# Patient Record
Sex: Female | Born: 1945
Health system: Southern US, Community
[De-identification: ages and names within clinical notes are randomized; demographics above are authoritative.]

## PROBLEM LIST (undated history)

## (undated) DIAGNOSIS — I350 Nonrheumatic aortic (valve) stenosis: Secondary | ICD-10-CM

## (undated) DIAGNOSIS — K219 Gastro-esophageal reflux disease without esophagitis: Secondary | ICD-10-CM

## (undated) DIAGNOSIS — G709 Myoneural disorder, unspecified: Secondary | ICD-10-CM

## (undated) DIAGNOSIS — G629 Polyneuropathy, unspecified: Secondary | ICD-10-CM

## (undated) DIAGNOSIS — Z973 Presence of spectacles and contact lenses: Secondary | ICD-10-CM

## (undated) DIAGNOSIS — M1990 Unspecified osteoarthritis, unspecified site: Secondary | ICD-10-CM

## (undated) DIAGNOSIS — H9190 Unspecified hearing loss, unspecified ear: Secondary | ICD-10-CM

## (undated) DIAGNOSIS — R031 Nonspecific low blood-pressure reading: Secondary | ICD-10-CM

## (undated) DIAGNOSIS — R55 Syncope and collapse: Secondary | ICD-10-CM

## (undated) DIAGNOSIS — F0390 Unspecified dementia without behavioral disturbance: Secondary | ICD-10-CM

## (undated) DIAGNOSIS — Z972 Presence of dental prosthetic device (complete) (partial): Secondary | ICD-10-CM

## (undated) DIAGNOSIS — I10 Essential (primary) hypertension: Secondary | ICD-10-CM

## (undated) DIAGNOSIS — K08109 Complete loss of teeth, unspecified cause, unspecified class: Secondary | ICD-10-CM

## (undated) DIAGNOSIS — E039 Hypothyroidism, unspecified: Secondary | ICD-10-CM

## (undated) DIAGNOSIS — E785 Hyperlipidemia, unspecified: Secondary | ICD-10-CM

## (undated) DIAGNOSIS — H409 Unspecified glaucoma: Secondary | ICD-10-CM

## (undated) DIAGNOSIS — D649 Anemia, unspecified: Secondary | ICD-10-CM

## (undated) DIAGNOSIS — T7840XA Allergy, unspecified, initial encounter: Secondary | ICD-10-CM

## (undated) DIAGNOSIS — F32A Depression, unspecified: Secondary | ICD-10-CM

## (undated) HISTORY — DX: Syncope and collapse: R55

## (undated) HISTORY — DX: Nonrheumatic aortic (valve) stenosis: I35.0

## (undated) HISTORY — DX: Anemia, unspecified: D64.9

## (undated) HISTORY — PX: COLONOSCOPY: SHX174

## (undated) HISTORY — DX: Allergy, unspecified, initial encounter: T78.40XA

## (undated) HISTORY — DX: Unspecified osteoarthritis, unspecified site: M19.90

## (undated) HISTORY — DX: Gastro-esophageal reflux disease without esophagitis: K21.9

## (undated) HISTORY — PX: TUBAL LIGATION: SHX77

## (undated) HISTORY — DX: Nonspecific low blood-pressure reading: R03.1

## (undated) HISTORY — DX: Hyperlipidemia, unspecified: E78.5

## (undated) HISTORY — DX: Essential (primary) hypertension: I10

## (undated) HISTORY — PX: ABDOMINAL HYSTERECTOMY: SHX81

## (undated) HISTORY — PX: APPENDECTOMY: SHX54

## (undated) HISTORY — PX: PITUITARY SURGERY: SHX203

---

## 1997-12-18 DIAGNOSIS — I639 Cerebral infarction, unspecified: Secondary | ICD-10-CM

## 1997-12-18 DIAGNOSIS — K759 Inflammatory liver disease, unspecified: Secondary | ICD-10-CM

## 1997-12-18 DIAGNOSIS — Z8673 Personal history of transient ischemic attack (TIA), and cerebral infarction without residual deficits: Secondary | ICD-10-CM

## 1997-12-18 HISTORY — DX: Inflammatory liver disease, unspecified: K75.9

## 1997-12-18 HISTORY — DX: Cerebral infarction, unspecified: I63.9

## 1997-12-18 HISTORY — DX: Personal history of transient ischemic attack (TIA), and cerebral infarction without residual deficits: Z86.73

## 2005-09-28 ENCOUNTER — Ambulatory Visit: Payer: Self-pay | Admitting: Internal Medicine

## 2005-10-09 ENCOUNTER — Ambulatory Visit: Payer: Self-pay | Admitting: Internal Medicine

## 2005-10-19 ENCOUNTER — Ambulatory Visit: Payer: Self-pay | Admitting: Internal Medicine

## 2005-10-21 ENCOUNTER — Ambulatory Visit (HOSPITAL_COMMUNITY): Admission: RE | Admit: 2005-10-21 | Discharge: 2005-10-21 | Payer: Self-pay | Admitting: Internal Medicine

## 2005-10-30 ENCOUNTER — Ambulatory Visit: Payer: Self-pay | Admitting: Gastroenterology

## 2005-11-22 ENCOUNTER — Ambulatory Visit: Payer: Self-pay | Admitting: Internal Medicine

## 2005-11-23 ENCOUNTER — Encounter (INDEPENDENT_AMBULATORY_CARE_PROVIDER_SITE_OTHER): Payer: Self-pay | Admitting: Internal Medicine

## 2005-11-23 LAB — CONVERTED CEMR LAB: TSH: 3.061 microintl units/mL

## 2005-12-06 ENCOUNTER — Ambulatory Visit: Payer: Self-pay | Admitting: Gastroenterology

## 2005-12-21 ENCOUNTER — Ambulatory Visit (HOSPITAL_COMMUNITY): Admission: RE | Admit: 2005-12-21 | Discharge: 2005-12-21 | Payer: Self-pay | Admitting: Gastroenterology

## 2005-12-21 ENCOUNTER — Ambulatory Visit: Payer: Self-pay | Admitting: Gastroenterology

## 2006-01-01 ENCOUNTER — Ambulatory Visit: Payer: Self-pay | Admitting: Internal Medicine

## 2006-02-05 ENCOUNTER — Ambulatory Visit: Payer: Self-pay | Admitting: Internal Medicine

## 2006-02-22 ENCOUNTER — Ambulatory Visit: Payer: Self-pay | Admitting: Internal Medicine

## 2006-03-03 ENCOUNTER — Ambulatory Visit (HOSPITAL_COMMUNITY): Admission: RE | Admit: 2006-03-03 | Discharge: 2006-03-03 | Payer: Self-pay | Admitting: Internal Medicine

## 2006-03-05 ENCOUNTER — Ambulatory Visit: Payer: Self-pay | Admitting: Internal Medicine

## 2006-05-07 ENCOUNTER — Ambulatory Visit: Payer: Self-pay | Admitting: Internal Medicine

## 2006-06-04 ENCOUNTER — Ambulatory Visit: Payer: Self-pay | Admitting: Internal Medicine

## 2006-06-05 ENCOUNTER — Encounter (INDEPENDENT_AMBULATORY_CARE_PROVIDER_SITE_OTHER): Payer: Self-pay | Admitting: Internal Medicine

## 2006-06-05 LAB — CONVERTED CEMR LAB
Microalbumin U total vol: 0.49 mg/L
Microalbumin U total vol: 0.49 mg/L

## 2006-07-03 ENCOUNTER — Ambulatory Visit: Payer: Self-pay | Admitting: Internal Medicine

## 2006-07-12 ENCOUNTER — Ambulatory Visit: Payer: Self-pay | Admitting: Internal Medicine

## 2006-08-06 ENCOUNTER — Ambulatory Visit: Payer: Self-pay | Admitting: Internal Medicine

## 2006-08-07 ENCOUNTER — Encounter: Admission: RE | Admit: 2006-08-07 | Discharge: 2006-08-07 | Payer: Self-pay | Admitting: Internal Medicine

## 2006-10-08 ENCOUNTER — Ambulatory Visit: Payer: Self-pay | Admitting: Internal Medicine

## 2006-10-16 ENCOUNTER — Encounter: Payer: Self-pay | Admitting: Internal Medicine

## 2006-10-16 ENCOUNTER — Other Ambulatory Visit: Admission: RE | Admit: 2006-10-16 | Discharge: 2006-10-16 | Payer: Self-pay | Admitting: Internal Medicine

## 2006-10-16 ENCOUNTER — Ambulatory Visit: Payer: Self-pay | Admitting: Internal Medicine

## 2006-11-23 ENCOUNTER — Ambulatory Visit: Payer: Self-pay | Admitting: Internal Medicine

## 2006-12-20 ENCOUNTER — Ambulatory Visit: Payer: Self-pay | Admitting: Internal Medicine

## 2006-12-31 ENCOUNTER — Ambulatory Visit (HOSPITAL_COMMUNITY): Admission: RE | Admit: 2006-12-31 | Discharge: 2006-12-31 | Payer: Self-pay | Admitting: Internal Medicine

## 2007-01-30 ENCOUNTER — Ambulatory Visit (HOSPITAL_BASED_OUTPATIENT_CLINIC_OR_DEPARTMENT_OTHER): Admission: RE | Admit: 2007-01-30 | Discharge: 2007-01-30 | Payer: Self-pay | Admitting: Orthopedic Surgery

## 2007-02-12 ENCOUNTER — Ambulatory Visit: Payer: Self-pay | Admitting: Internal Medicine

## 2007-02-13 ENCOUNTER — Ambulatory Visit (HOSPITAL_COMMUNITY): Admission: RE | Admit: 2007-02-13 | Discharge: 2007-02-13 | Payer: Self-pay | Admitting: Internal Medicine

## 2007-02-22 ENCOUNTER — Ambulatory Visit: Payer: Self-pay | Admitting: Internal Medicine

## 2007-02-25 ENCOUNTER — Ambulatory Visit (HOSPITAL_COMMUNITY): Admission: RE | Admit: 2007-02-25 | Discharge: 2007-02-25 | Payer: Self-pay | Admitting: Internal Medicine

## 2007-04-08 ENCOUNTER — Ambulatory Visit: Payer: Self-pay | Admitting: Gastroenterology

## 2007-06-03 ENCOUNTER — Ambulatory Visit: Payer: Self-pay | Admitting: Vascular Surgery

## 2007-06-18 ENCOUNTER — Ambulatory Visit (HOSPITAL_COMMUNITY): Admission: RE | Admit: 2007-06-18 | Discharge: 2007-06-18 | Payer: Self-pay | Admitting: Endocrinology

## 2007-07-15 ENCOUNTER — Emergency Department (HOSPITAL_COMMUNITY): Admission: EM | Admit: 2007-07-15 | Discharge: 2007-07-15 | Payer: Self-pay | Admitting: Emergency Medicine

## 2007-07-24 ENCOUNTER — Ambulatory Visit: Payer: Self-pay | Admitting: Internal Medicine

## 2007-07-29 ENCOUNTER — Encounter (INDEPENDENT_AMBULATORY_CARE_PROVIDER_SITE_OTHER): Payer: Self-pay | Admitting: Internal Medicine

## 2007-07-29 DIAGNOSIS — D353 Benign neoplasm of craniopharyngeal duct: Secondary | ICD-10-CM

## 2007-07-29 DIAGNOSIS — I10 Essential (primary) hypertension: Secondary | ICD-10-CM

## 2007-07-29 DIAGNOSIS — G2581 Restless legs syndrome: Secondary | ICD-10-CM | POA: Insufficient documentation

## 2007-07-29 DIAGNOSIS — J45909 Unspecified asthma, uncomplicated: Secondary | ICD-10-CM

## 2007-07-29 DIAGNOSIS — E039 Hypothyroidism, unspecified: Secondary | ICD-10-CM

## 2007-07-29 DIAGNOSIS — B1710 Acute hepatitis C without hepatic coma: Secondary | ICD-10-CM

## 2007-07-29 DIAGNOSIS — E119 Type 2 diabetes mellitus without complications: Secondary | ICD-10-CM

## 2007-07-29 DIAGNOSIS — D352 Benign neoplasm of pituitary gland: Secondary | ICD-10-CM

## 2007-07-29 DIAGNOSIS — G609 Hereditary and idiopathic neuropathy, unspecified: Secondary | ICD-10-CM | POA: Insufficient documentation

## 2007-07-29 DIAGNOSIS — K219 Gastro-esophageal reflux disease without esophagitis: Secondary | ICD-10-CM

## 2007-09-04 ENCOUNTER — Ambulatory Visit: Payer: Self-pay | Admitting: Internal Medicine

## 2007-09-11 ENCOUNTER — Ambulatory Visit: Payer: Self-pay | Admitting: Internal Medicine

## 2007-09-19 ENCOUNTER — Ambulatory Visit: Payer: Self-pay | Admitting: Internal Medicine

## 2007-11-22 ENCOUNTER — Ambulatory Visit: Payer: Self-pay | Admitting: Internal Medicine

## 2008-01-20 ENCOUNTER — Ambulatory Visit: Payer: Self-pay | Admitting: Family Medicine

## 2008-01-24 ENCOUNTER — Ambulatory Visit: Payer: Self-pay | Admitting: Family Medicine

## 2008-01-24 ENCOUNTER — Ambulatory Visit: Payer: Self-pay | Admitting: Gastroenterology

## 2008-01-29 ENCOUNTER — Ambulatory Visit (HOSPITAL_COMMUNITY): Admission: RE | Admit: 2008-01-29 | Discharge: 2008-01-29 | Payer: Self-pay | Admitting: Gastroenterology

## 2008-02-27 ENCOUNTER — Encounter: Admission: RE | Admit: 2008-02-27 | Discharge: 2008-02-27 | Payer: Self-pay | Admitting: Neurosurgery

## 2008-03-04 ENCOUNTER — Ambulatory Visit: Payer: Self-pay | Admitting: Internal Medicine

## 2008-03-19 ENCOUNTER — Encounter: Payer: Self-pay | Admitting: Internal Medicine

## 2008-03-19 ENCOUNTER — Ambulatory Visit: Payer: Self-pay | Admitting: Internal Medicine

## 2008-03-19 LAB — CONVERTED CEMR LAB
Albumin: 4.1 g/dL (ref 3.5–5.2)
BUN: 13 mg/dL (ref 6–23)
Basophils Absolute: 0 10*3/uL (ref 0.0–0.1)
CO2: 30 meq/L (ref 19–32)
Calcium: 9.4 mg/dL (ref 8.4–10.5)
Chloride: 101 meq/L (ref 96–112)
Cholesterol: 166 mg/dL (ref 0–200)
Creatinine, Ser: 0.82 mg/dL (ref 0.40–1.20)
Eosinophils Relative: 2 % (ref 0–5)
Glucose, Bld: 111 mg/dL — ABNORMAL HIGH (ref 70–99)
HCT: 39.8 % (ref 36.0–46.0)
HDL: 50 mg/dL (ref >39)
Hemoglobin: 13.3 g/dL (ref 12.0–15.0)
Lymphocytes Relative: 31 % (ref 12–46)
Monocytes Absolute: 0.3 10*3/uL (ref 0.1–1.0)
Monocytes Relative: 6 % (ref 3–12)
Neutro Abs: 3 10*3/uL (ref 1.7–7.7)
Potassium: 4.1 meq/L (ref 3.5–5.3)
RBC: 4.94 M/uL (ref 3.87–5.11)
RDW: 14.2 % (ref 11.5–15.5)
TSH: 0.757 microintl units/mL (ref 0.350–5.50)
Total CHOL/HDL Ratio: 3.3
Triglycerides: 175 mg/dL — ABNORMAL HIGH (ref <150)

## 2008-03-20 ENCOUNTER — Ambulatory Visit: Payer: Self-pay | Admitting: Gastroenterology

## 2008-03-20 LAB — CONVERTED CEMR LAB: Creatinine, Ser: 0.8 mg/dL (ref 0.4–1.2)

## 2008-03-23 ENCOUNTER — Ambulatory Visit: Payer: Self-pay | Admitting: Cardiovascular Disease

## 2008-05-01 ENCOUNTER — Encounter (INDEPENDENT_AMBULATORY_CARE_PROVIDER_SITE_OTHER): Payer: Self-pay | Admitting: Neurosurgery

## 2008-05-01 ENCOUNTER — Inpatient Hospital Stay (HOSPITAL_COMMUNITY): Admission: RE | Admit: 2008-05-01 | Discharge: 2008-05-05 | Payer: Self-pay | Admitting: Neurosurgery

## 2008-06-09 ENCOUNTER — Ambulatory Visit: Payer: Self-pay | Admitting: Gastroenterology

## 2008-06-09 DIAGNOSIS — K5900 Constipation, unspecified: Secondary | ICD-10-CM

## 2008-06-11 ENCOUNTER — Ambulatory Visit: Payer: Self-pay | Admitting: Internal Medicine

## 2008-06-17 ENCOUNTER — Ambulatory Visit (HOSPITAL_COMMUNITY): Admission: RE | Admit: 2008-06-17 | Discharge: 2008-06-17 | Payer: Self-pay | Admitting: Family Medicine

## 2008-06-29 ENCOUNTER — Ambulatory Visit: Payer: Self-pay | Admitting: Internal Medicine

## 2008-07-01 ENCOUNTER — Encounter: Admission: RE | Admit: 2008-07-01 | Discharge: 2008-07-01 | Payer: Self-pay | Admitting: Neurosurgery

## 2008-07-03 ENCOUNTER — Ambulatory Visit: Payer: Self-pay | Admitting: Internal Medicine

## 2008-08-10 ENCOUNTER — Ambulatory Visit: Payer: Self-pay | Admitting: Gastroenterology

## 2008-09-11 ENCOUNTER — Ambulatory Visit: Payer: Self-pay | Admitting: Family Medicine

## 2008-09-21 ENCOUNTER — Ambulatory Visit: Payer: Self-pay | Admitting: Internal Medicine

## 2008-10-26 ENCOUNTER — Ambulatory Visit: Payer: Self-pay | Admitting: Internal Medicine

## 2008-11-27 ENCOUNTER — Ambulatory Visit: Payer: Self-pay | Admitting: Family Medicine

## 2008-11-30 ENCOUNTER — Ambulatory Visit: Payer: Self-pay | Admitting: Internal Medicine

## 2009-01-30 ENCOUNTER — Encounter: Admission: RE | Admit: 2009-01-30 | Discharge: 2009-01-30 | Payer: Self-pay | Admitting: Neurosurgery

## 2009-02-08 ENCOUNTER — Ambulatory Visit: Payer: Self-pay | Admitting: Internal Medicine

## 2009-02-08 LAB — CONVERTED CEMR LAB
ALT: 26 units/L (ref 0–35)
CO2: 20 meq/L (ref 19–32)
Calcium: 9.3 mg/dL (ref 8.4–10.5)
Chloride: 98 meq/L (ref 96–112)
Cholesterol: 161 mg/dL (ref 0–200)
Creatinine, Ser: 0.97 mg/dL (ref 0.40–1.20)
Eosinophils Absolute: 0.1 10*3/uL (ref 0.0–0.7)
Eosinophils Relative: 1 % (ref 0–5)
Glucose, Bld: 137 mg/dL — ABNORMAL HIGH (ref 70–99)
HCT: 42 % (ref 36.0–46.0)
Lymphocytes Relative: 28 % (ref 12–46)
Lymphs Abs: 1.4 10*3/uL (ref 0.7–4.0)
MCV: 79.8 fL (ref 78.0–100.0)
Monocytes Relative: 6 % (ref 3–12)
Platelets: 238 10*3/uL (ref 150–400)
RBC: 5.26 M/uL — ABNORMAL HIGH (ref 3.87–5.11)
Sed Rate: 11 mm/hr (ref 0–22)
Sodium: 139 meq/L (ref 135–145)
Total Protein: 8.1 g/dL (ref 6.0–8.3)
Triglycerides: 131 mg/dL (ref <150)
WBC: 5 10*3/uL (ref 4.0–10.5)

## 2009-02-12 ENCOUNTER — Ambulatory Visit: Payer: Self-pay | Admitting: Family Medicine

## 2009-02-26 ENCOUNTER — Encounter: Admission: RE | Admit: 2009-02-26 | Discharge: 2009-04-26 | Payer: Self-pay | Admitting: Family Medicine

## 2009-03-02 ENCOUNTER — Ambulatory Visit: Payer: Self-pay | Admitting: Gastroenterology

## 2009-03-02 DIAGNOSIS — Z860100 Personal history of colon polyps, unspecified: Secondary | ICD-10-CM | POA: Insufficient documentation

## 2009-04-16 ENCOUNTER — Ambulatory Visit: Payer: Self-pay | Admitting: Gastroenterology

## 2009-06-08 ENCOUNTER — Ambulatory Visit: Payer: Self-pay | Admitting: Internal Medicine

## 2009-08-04 ENCOUNTER — Ambulatory Visit: Payer: Self-pay | Admitting: Family Medicine

## 2009-09-10 ENCOUNTER — Telehealth (INDEPENDENT_AMBULATORY_CARE_PROVIDER_SITE_OTHER): Payer: Self-pay | Admitting: *Deleted

## 2009-09-29 ENCOUNTER — Ambulatory Visit: Payer: Self-pay | Admitting: Internal Medicine

## 2009-09-29 ENCOUNTER — Encounter: Payer: Self-pay | Admitting: Family Medicine

## 2009-09-29 LAB — CONVERTED CEMR LAB
ALT: 35 units/L (ref 0–35)
BUN: 11 mg/dL (ref 6–23)
CO2: 28 meq/L (ref 19–32)
Calcium: 9.2 mg/dL (ref 8.4–10.5)
Chloride: 102 meq/L (ref 96–112)
Cholesterol: 169 mg/dL (ref 0–200)
Creatinine, Ser: 0.94 mg/dL (ref 0.40–1.20)
Eosinophils Absolute: 0.1 10*3/uL (ref 0.0–0.7)
Eosinophils Relative: 2 % (ref 0–5)
Glucose, Bld: 138 mg/dL — ABNORMAL HIGH (ref 70–99)
HCT: 40.6 % (ref 36.0–46.0)
Lymphs Abs: 1.7 10*3/uL (ref 0.7–4.0)
MCHC: 32.3 g/dL (ref 30.0–36.0)
MCV: 79.9 fL (ref 78.0–100.0)
Monocytes Absolute: 0.2 10*3/uL (ref 0.1–1.0)
Monocytes Relative: 5 % (ref 3–12)
RBC: 5.08 M/uL (ref 3.87–5.11)
Total CHOL/HDL Ratio: 3.5
Triglycerides: 143 mg/dL (ref <150)
Vitamin B-12: 325 pg/mL (ref 211–911)
WBC: 5.1 10*3/uL (ref 4.0–10.5)

## 2009-10-08 ENCOUNTER — Ambulatory Visit: Payer: Self-pay | Admitting: Internal Medicine

## 2009-10-26 ENCOUNTER — Ambulatory Visit (HOSPITAL_COMMUNITY): Admission: RE | Admit: 2009-10-26 | Discharge: 2009-10-26 | Payer: Self-pay | Admitting: Internal Medicine

## 2009-11-01 ENCOUNTER — Encounter: Payer: Self-pay | Admitting: Family Medicine

## 2009-11-01 ENCOUNTER — Ambulatory Visit: Payer: Self-pay | Admitting: Internal Medicine

## 2009-11-01 LAB — CONVERTED CEMR LAB
CO2: 28 meq/L (ref 19–32)
Chloride: 100 meq/L (ref 96–112)
Creatinine, Ser: 0.89 mg/dL (ref 0.40–1.20)

## 2009-11-16 ENCOUNTER — Observation Stay (HOSPITAL_COMMUNITY): Admission: EM | Admit: 2009-11-16 | Discharge: 2009-11-18 | Payer: Self-pay | Admitting: Emergency Medicine

## 2009-11-16 ENCOUNTER — Encounter (INDEPENDENT_AMBULATORY_CARE_PROVIDER_SITE_OTHER): Payer: Self-pay | Admitting: *Deleted

## 2009-11-17 ENCOUNTER — Encounter (INDEPENDENT_AMBULATORY_CARE_PROVIDER_SITE_OTHER): Payer: Self-pay | Admitting: *Deleted

## 2009-11-18 ENCOUNTER — Encounter (INDEPENDENT_AMBULATORY_CARE_PROVIDER_SITE_OTHER): Payer: Self-pay | Admitting: Internal Medicine

## 2009-12-07 ENCOUNTER — Encounter (INDEPENDENT_AMBULATORY_CARE_PROVIDER_SITE_OTHER): Payer: Self-pay | Admitting: Adult Health

## 2009-12-07 ENCOUNTER — Ambulatory Visit: Payer: Self-pay | Admitting: Internal Medicine

## 2009-12-07 LAB — CONVERTED CEMR LAB
Basophils Relative: 0 % (ref 0–1)
CO2: 28 meq/L (ref 19–32)
Cholesterol: 154 mg/dL (ref 0–200)
Creatinine, Ser: 0.81 mg/dL (ref 0.40–1.20)
Eosinophils Absolute: 0.1 10*3/uL (ref 0.0–0.7)
Glucose, Bld: 73 mg/dL (ref 70–99)
Hemoglobin: 12.9 g/dL (ref 12.0–15.0)
MCHC: 33.2 g/dL (ref 30.0–36.0)
MCV: 77.8 fL — ABNORMAL LOW (ref 78.0–100.0)
Monocytes Absolute: 0.4 10*3/uL (ref 0.1–1.0)
Monocytes Relative: 7 % (ref 3–12)
Neutrophils Relative %: 55 % (ref 43–77)
RBC: 5 M/uL (ref 3.87–5.11)
Total Bilirubin: 0.5 mg/dL (ref 0.3–1.2)
Total CHOL/HDL Ratio: 3.3
Triglycerides: 113 mg/dL (ref <150)
VLDL: 23 mg/dL (ref 0–40)

## 2009-12-21 ENCOUNTER — Ambulatory Visit: Payer: Self-pay | Admitting: Gastroenterology

## 2010-01-03 ENCOUNTER — Ambulatory Visit: Payer: Self-pay | Admitting: Internal Medicine

## 2010-01-03 ENCOUNTER — Encounter (INDEPENDENT_AMBULATORY_CARE_PROVIDER_SITE_OTHER): Payer: Self-pay | Admitting: Adult Health

## 2010-01-14 ENCOUNTER — Ambulatory Visit: Payer: Self-pay | Admitting: Gastroenterology

## 2010-02-16 ENCOUNTER — Encounter (INDEPENDENT_AMBULATORY_CARE_PROVIDER_SITE_OTHER): Payer: Self-pay | Admitting: Adult Health

## 2010-02-16 ENCOUNTER — Ambulatory Visit: Payer: Self-pay | Admitting: Internal Medicine

## 2010-02-16 LAB — CONVERTED CEMR LAB
Sed Rate: 19 mm/hr (ref 0–22)
Uric Acid, Serum: 5.5 mg/dL (ref 2.4–7.0)

## 2010-02-17 ENCOUNTER — Ambulatory Visit (HOSPITAL_COMMUNITY): Admission: RE | Admit: 2010-02-17 | Discharge: 2010-02-17 | Payer: Self-pay | Admitting: Internal Medicine

## 2010-03-15 ENCOUNTER — Ambulatory Visit: Payer: Self-pay | Admitting: Gastroenterology

## 2010-03-23 ENCOUNTER — Encounter: Admission: RE | Admit: 2010-03-23 | Discharge: 2010-03-23 | Payer: Self-pay | Admitting: Neurosurgery

## 2010-05-11 ENCOUNTER — Ambulatory Visit: Payer: Self-pay | Admitting: Gastroenterology

## 2010-05-11 ENCOUNTER — Encounter (INDEPENDENT_AMBULATORY_CARE_PROVIDER_SITE_OTHER): Payer: Self-pay | Admitting: *Deleted

## 2010-05-11 DIAGNOSIS — R1013 Epigastric pain: Secondary | ICD-10-CM

## 2010-05-11 DIAGNOSIS — K3189 Other diseases of stomach and duodenum: Secondary | ICD-10-CM

## 2010-05-18 ENCOUNTER — Ambulatory Visit: Payer: Self-pay | Admitting: Internal Medicine

## 2010-05-18 ENCOUNTER — Encounter (INDEPENDENT_AMBULATORY_CARE_PROVIDER_SITE_OTHER): Payer: Self-pay | Admitting: Adult Health

## 2010-05-18 LAB — CONVERTED CEMR LAB
ALT: 42 units/L — ABNORMAL HIGH (ref 0–35)
AST: 42 units/L — ABNORMAL HIGH (ref 0–37)
Alkaline Phosphatase: 50 units/L (ref 39–117)
Basophils Relative: 1 % (ref 0–1)
CO2: 29 meq/L (ref 19–32)
Cholesterol: 155 mg/dL (ref 0–200)
Free T4: 1.16 ng/dL (ref 0.80–1.80)
LDL Cholesterol: 77 mg/dL (ref 0–99)
MCHC: 33.7 g/dL (ref 30.0–36.0)
Monocytes Relative: 10 % (ref 3–12)
Neutro Abs: 2.3 10*3/uL (ref 1.7–7.7)
Neutrophils Relative %: 55 % (ref 43–77)
RBC: 5.04 M/uL (ref 3.87–5.11)
Sodium: 139 meq/L (ref 135–145)
TSH: 1.45 microintl units/mL (ref 0.350–4.500)
Total Bilirubin: 0.4 mg/dL (ref 0.3–1.2)
Total Protein: 8 g/dL (ref 6.0–8.3)
VLDL: 30 mg/dL (ref 0–40)
Vitamin B-12: 379 pg/mL (ref 211–911)
WBC: 4.2 10*3/uL (ref 4.0–10.5)

## 2010-05-23 ENCOUNTER — Encounter (INDEPENDENT_AMBULATORY_CARE_PROVIDER_SITE_OTHER): Payer: Self-pay | Admitting: Adult Health

## 2010-05-24 ENCOUNTER — Ambulatory Visit (HOSPITAL_COMMUNITY): Admission: RE | Admit: 2010-05-24 | Discharge: 2010-05-24 | Payer: Self-pay | Admitting: Adult Health

## 2010-05-24 ENCOUNTER — Telehealth (INDEPENDENT_AMBULATORY_CARE_PROVIDER_SITE_OTHER): Payer: Self-pay | Admitting: *Deleted

## 2010-05-25 ENCOUNTER — Ambulatory Visit: Payer: Self-pay | Admitting: Family Medicine

## 2010-06-17 ENCOUNTER — Encounter: Admission: RE | Admit: 2010-06-17 | Discharge: 2010-07-19 | Payer: Self-pay | Admitting: Adult Health

## 2010-06-21 ENCOUNTER — Ambulatory Visit: Payer: Self-pay | Admitting: Gastroenterology

## 2010-07-06 ENCOUNTER — Encounter (INDEPENDENT_AMBULATORY_CARE_PROVIDER_SITE_OTHER): Payer: Self-pay | Admitting: *Deleted

## 2010-07-18 ENCOUNTER — Encounter: Payer: Self-pay | Admitting: Gastroenterology

## 2010-07-26 ENCOUNTER — Encounter (INDEPENDENT_AMBULATORY_CARE_PROVIDER_SITE_OTHER): Payer: Self-pay | Admitting: *Deleted

## 2010-07-26 ENCOUNTER — Ambulatory Visit: Payer: Self-pay | Admitting: Gastroenterology

## 2010-07-26 DIAGNOSIS — D375 Neoplasm of uncertain behavior of rectum: Secondary | ICD-10-CM

## 2010-07-26 DIAGNOSIS — R933 Abnormal findings on diagnostic imaging of other parts of digestive tract: Secondary | ICD-10-CM | POA: Insufficient documentation

## 2010-07-26 DIAGNOSIS — D378 Neoplasm of uncertain behavior of other specified digestive organs: Secondary | ICD-10-CM

## 2010-07-26 DIAGNOSIS — D371 Neoplasm of uncertain behavior of stomach: Secondary | ICD-10-CM | POA: Insufficient documentation

## 2010-07-27 ENCOUNTER — Ambulatory Visit: Payer: Self-pay | Admitting: Gastroenterology

## 2010-07-29 ENCOUNTER — Telehealth: Payer: Self-pay | Admitting: Gastroenterology

## 2010-08-16 ENCOUNTER — Encounter (HOSPITAL_COMMUNITY): Admission: RE | Admit: 2010-08-16 | Discharge: 2010-09-15 | Payer: Self-pay | Admitting: Gastroenterology

## 2010-09-05 ENCOUNTER — Encounter (INDEPENDENT_AMBULATORY_CARE_PROVIDER_SITE_OTHER): Payer: Self-pay | Admitting: *Deleted

## 2010-09-23 ENCOUNTER — Ambulatory Visit: Payer: Self-pay | Admitting: Gastroenterology

## 2010-09-27 ENCOUNTER — Encounter: Admission: RE | Admit: 2010-09-27 | Discharge: 2010-11-09 | Payer: Self-pay | Admitting: Orthopedic Surgery

## 2010-10-13 ENCOUNTER — Encounter (INDEPENDENT_AMBULATORY_CARE_PROVIDER_SITE_OTHER): Payer: Self-pay | Admitting: Family Medicine

## 2010-10-13 LAB — CONVERTED CEMR LAB
BUN: 10 mg/dL (ref 6–23)
CO2: 30 meq/L (ref 19–32)
Chloride: 102 meq/L (ref 96–112)
Glucose, Bld: 87 mg/dL (ref 70–99)
Potassium: 4.2 meq/L (ref 3.5–5.3)
Sodium: 143 meq/L (ref 135–145)

## 2010-10-14 ENCOUNTER — Encounter (INDEPENDENT_AMBULATORY_CARE_PROVIDER_SITE_OTHER): Payer: Self-pay | Admitting: Family Medicine

## 2010-10-14 ENCOUNTER — Ambulatory Visit (HOSPITAL_COMMUNITY): Admission: RE | Admit: 2010-10-14 | Discharge: 2010-10-14 | Payer: Self-pay | Admitting: Family Medicine

## 2010-10-14 ENCOUNTER — Ambulatory Visit: Payer: Self-pay | Admitting: Vascular Surgery

## 2010-11-18 ENCOUNTER — Inpatient Hospital Stay (HOSPITAL_COMMUNITY): Admission: EM | Admit: 2010-11-18 | Discharge: 2010-11-20 | Payer: Self-pay | Admitting: Emergency Medicine

## 2010-11-19 ENCOUNTER — Encounter (INDEPENDENT_AMBULATORY_CARE_PROVIDER_SITE_OTHER): Payer: Self-pay | Admitting: Internal Medicine

## 2010-11-20 ENCOUNTER — Encounter (INDEPENDENT_AMBULATORY_CARE_PROVIDER_SITE_OTHER): Payer: Self-pay | Admitting: *Deleted

## 2010-11-28 ENCOUNTER — Encounter
Admission: RE | Admit: 2010-11-28 | Discharge: 2010-12-15 | Payer: Self-pay | Source: Home / Self Care | Attending: Family Medicine | Admitting: Family Medicine

## 2010-12-21 ENCOUNTER — Encounter: Admission: RE | Admit: 2010-12-21 | Payer: Self-pay | Source: Home / Self Care | Admitting: Family Medicine

## 2010-12-21 ENCOUNTER — Encounter
Admission: RE | Admit: 2010-12-21 | Discharge: 2011-01-17 | Payer: Self-pay | Source: Home / Self Care | Attending: Family Medicine | Admitting: Family Medicine

## 2011-01-06 ENCOUNTER — Ambulatory Visit: Admit: 2011-01-06 | Payer: Self-pay | Admitting: Gastroenterology

## 2011-01-07 ENCOUNTER — Encounter: Payer: Self-pay | Admitting: Internal Medicine

## 2011-01-08 ENCOUNTER — Encounter: Payer: Self-pay | Admitting: Endocrinology

## 2011-01-17 NOTE — Letter (Signed)
Summary: Diabetic Instructions  [facility]  [address]   [place], [place] [address]   Phone: [phone]  Fax: [phone]    Mandy Webb 05/13/1946 MRN: [id number]   X   ORAL DIABETIC MEDICATION INSTRUCTIONS  The day before your procedure:   Take your diabetic pill as you do normally  The day of your procedure:   Do not take your diabetic pill    We will check your blood sugar levels during the admission process and again in Recovery before discharging you home  ________________________________________________________________________  

## 2011-01-17 NOTE — Assessment & Plan Note (Signed)
GI PROBLEM LIST: 1. History of colon polyps/colonoscopy 2005 in [place] (7mm polyp),Colonoscopy 2010 found no polyps or cancers, positive melanosis throughout colon.  ++ Family history for colon cancer.  Surveillance colonoscopy 2015. 2. Questionable history of stomach polyps or Barrett's.  These were by endoscopies in [place].  Repeat esophagogastroduodenoscopy by Dr. Christella Hartigan January 2007 showed normal upper gastrointestinal evaluation.  No Barrett's, no polyps. 3. Chronic constipation.  Well treated with a variety of agents, including an herbal medicine that she takes, aloe vera, Miralax p.r.n.  4. intermitent abd pains: CT 08/2008 essentially negative, felt this was from chronic constipation.  Ultrasound abdomen on November 16, 2009, showed diffuse fatty infiltration of the liver, otherwise unremarkable. HIDA scan on December, 2010, showed normal gallbladder ejection fraction, otherwise an unremarkable study. 5. abnormal gastric biopsies, June 2011. Moderate to severe gastritis noted. Biopsies taken. No H. pylori however pathology suggested possible carcinoid reaction.  august 2011; August, 2000 11 repeat EGD performed; pan gastritis, pathology showed gastritis EC hyperplasia, NE features.  Octreotide scan normal.   History of Present Illness Visit Type: Follow-up Visit Primary GI MD: Rob Bunting MD Primary Provider: [facility]  Requesting Provider: n/a Chief Complaint: Octerotide scan f/u History of Present Illness:      very pleasant 65 year old woman whom I last a few weeks ago. See those results summarized above. she had IF labs checked this AM. She feels full in AM, bloated in AM.  Like she just finished eating.               Current Medications (verified): 1)  Advair Diskus 500-50 Mcg/dose  Misc (Fluticasone-Salmeterol) .... Inhale 1 Puff Two Times A Day in The Am and The Pm 2)  Miralax   Powd (Polyethylene Glycol 3350) .... Mix Iin 8 Oz Glass of Water Drink Two Times A  Day 3)  Fluticasone Propionate 50 Mcg/act Susp (Fluticasone Propionate) .... Take As Directed 4)  Glipizide 2.5 Mg Xr24h-Tab (Glipizide) .... Two Times A Day 5)  Proair Hfa 108 (90 Base) Mcg/act Aers (Albuterol Sulfate) .... As Directed 6)  Ramipril 10 Mg Caps (Ramipril) .... One Tablet By Mouth Once Daily 7)  Singulair 10 Mg Tabs (Montelukast Sodium) .... At Bedtime 8)  Multivitamins   Tabs (Multiple Vitamin) .... One Tablet By Mouth Once Daily 9)  Ropinirole Hcl 0.5 Mg Tabs (Ropinirole Hcl) .Marland Kitchen.. 1 By Mouth At Bedtime 10)  Omeprazole 20 Mg Cpdr (Omeprazole) .... 2 By Mouth Daily 11)  Lipitor 20 Mg Tabs (Atorvastatin Calcium) .Marland Kitchen.. 1 By Mouth Once Daily 12)  Levothyroxine Sodium 75 Mcg Tabs (Levothyroxine Sodium) .Marland Kitchen.. 1 By Mouth Once Daily 13)  Gabapentin 600 Mg Tabs (Gabapentin) .Marland Kitchen.. 1 By Mouth Three Times A Day  Allergies (verified): No Known Drug Allergies  Vital Signs:  Patient profile:   65 year old female Height:      60 inches Weight:      179 pounds BMI:     35.08 BSA:     1.78 Pulse rate:   92 / minute Pulse rhythm:   regular BP sitting:   118 / 64  (left arm) Cuff size:   regular  Vitals Entered By: Ok Anis CMA (September 23, 2010 12:48 PM)  Physical Exam  Additional Exam:  Constitutional: generally well appearing Psychiatric: alert and oriented times 3 Abdomen: soft, non-tender, non-distended, normal bowel sounds    Impression & Recommendations:  Problem # 1:  Abnormal gastric biopsies I do not think that she has carcinoid  lesions in her stomach. I think this is a hyperplastic reaction, we are checking intrinsic factor antibodies to understand she has underlying pernicious anemia. She will return to see me in 3 months time. I may want to repeat gastric biopsy at some point in the near future.  Problem # 2:  bloating in the a.m. she does have moderate to severe gastritis in perhaps some mild gastroparesis. I will try her on Reglan at night. I did explain to her  that Blain Pais dyskinesia could occur although it's very unusual in her age patient at such a very low dose as hers.  Patient Instructions: 1)  Trial of reglan (10mg  pills, one pill at bedtime every night). 2)  Tardive dyskenesia can occur (lip smacking) but this is very uncommon at such a low dose. 3)  Return to see Dr. Christella Hartigan in 3 months. 4)  The medication list was reviewed and reconciled.  All changed / newly prescribed medications were explained.  A complete medication list was provided to the patient / caregiver. Prescriptions: REGLAN 10 MG  TABS (METOCLOPRAMIDE HCL) take one pill at bedtime every night  #30 x 3   Entered and Authorized by:   Rachael Fee MD   Signed by:   Rachael Fee MD on 09/23/2010   Method used:   Faxed to ...       [facility] (retail)       [address]       [address]       [place], [place]  [address]       Ph: [phone]       Fax: [phone]   RxID:   [id number]

## 2011-01-17 NOTE — Assessment & Plan Note (Signed)
GI PROBLEM LIST: 1. History of colon polyps/colonoscopy 2005 in [place] (7mm polyp),Colonoscopy 2010 found no polyps or cancers, positive melanosis throughout colon.  ++ Family history for colon cancer.  Surveillance colonoscopy 2015. 2. Questionable history of stomach polyps or Barrett's.  These were by endoscopies in [place].  Repeat esophagogastroduodenoscopy by Dr. Christella Hartigan January 2007 showed normal upper gastrointestinal evaluation.  No Barrett's, no polyps. 3. Chronic constipation.  Well treated with a variety of agents, including an herbal medicine that she takes, aloe vera, Miralax p.r.n.  4. intermitent abd pains: CT 08/2008 essentially negative, felt this was from chronic constipation.  Ultrasound abdomen on November 16, 2009, showed diffuse fatty infiltration of the liver, otherwise unremarkable. HIDA scan on December, 2010, showed normal gallbladder ejection fraction, otherwise an unremarkable study.   History of Present Illness Visit Type: follow up  Primary GI MD: Rob Bunting MD Primary Provider: [facility]  Requesting Provider: n/a Chief Complaint: follow up History of Present Illness:     since her last visit she took the nexium one pill every day.  She noticed no change on this medicine.  She is still bothered by LUQ pains and RUQ pains that rotate back and forth, under her rib cage.  eats dinner 5-7pm, will eat small cracker sometimes before laying down for bed (usually around 10pm).  She has a BM about 1-2 times a week.  She does usually have to push and strain to have BM.  she takes one capful of miralax every other day.  Drinks green tea.           Current Medications (verified): 1)  Advair Diskus 500-50 Mcg/dose  Misc (Fluticasone-Salmeterol) .... Inhale 1 Puff Two Times A Day in The Am and The Pm 2)  Miralax   Powd (Polyethylene Glycol 3350) .... Mix Iin 8 Oz Glass of Water Drink Two Times A Day 3)  Flexeril 10 Mg Tabs (Cyclobenzaprine Hcl) .... Every  Evening 4)  Fluticasone Propionate 50 Mcg/act Susp (Fluticasone Propionate) .... Take As Directed 5)  Glipizide 2.5 Mg Xr24h-Tab (Glipizide) .... Two Times A Day 6)  Proair Hfa 108 (90 Base) Mcg/act Aers (Albuterol Sulfate) .... As Directed 7)  Ramipril 10 Mg Caps (Ramipril) .... One Tablet By Mouth Once Daily 8)  Singulair 10 Mg Tabs (Montelukast Sodium) .... At Bedtime 9)  Multivitamins   Tabs (Multiple Vitamin) .... One Tablet By Mouth Once Daily  Allergies (verified): No Known Drug Allergies  Social History: does not drink caffeine does not drink alcohol does not smoke cigarrettes no peppermint or chocolate  Vital Signs:  Patient profile:   65 year old female Height:      60 inches Weight:      187 pounds BMI:     36.65 BSA:     1.82 Pulse rate:   96 / minute Pulse rhythm:   regular BP sitting:   126 / 82  (left arm) Cuff size:   regular  Vitals Entered By: Ok Anis CMA (January 14, 2010 10:24 AM)  Physical Exam  Additional Exam:  Constitutional:Obese Psychiatric: alert and oriented times 3 Abdomen: soft, non-tender, non-distended, normal bowel sounds    Impression & Recommendations:  Problem # 1:  chronic functional constipation she has one to 2 bowel movements per week usually only after straining quite a bit. This type of constipation can certainly contribute to abdominal discomforts such as hers. She takes MiraLax only every other day and I recommended that she in taking it on a  daily basis instead. She knows she can increase it to 2 scoops a day if needed. She will return to see me in 8 weeks and sooner if needed.  Patient Instructions: 1)  Increase miralax dose so that you take one capful of the powder EVERY SINGLE DAY.   Give this 2 weeks, if no improvement in 2 weeks, then increase to 2 capfuls EVERY SINGLE DAY. 2)  Weight loss may help your abd discomforts. 3)  Return to see Dr. Christella Hartigan in 8-10 weeks. 4)  The medication list was reviewed and reconciled.   All changed / newly prescribed medications were explained.  A complete medication list was provided to the patient / caregiver.

## 2011-01-17 NOTE — Letter (Signed)
Summary: Appointment Reminder  [facility]  [address] [place], [place] [address]   Phone: [phone]  Fax: [phone]        July 06, 2010 MRN: [id number]    [facility] [address] [place], [place]  [address]    Dear Ms. Yeatman,   We have been unable to reach you by phone to schedule a follow up   appointment that was recommended for you by Dr.Jacobs to talk about your   results. It is very important that we reach you to schedule an   appointment.  We hope that you allow [place] to participate in your health   care needs.  Please contact us at [phone] at your earliest  convenience to schedule your appointment.     Sincerely,    Chales Abrahams CMA (AAMA)  Appended Document: Appointment Reminder letter mailed

## 2011-01-17 NOTE — Procedures (Signed)
Summary: Upper Endoscopy  Patient: [name] Note: All result statuses are Final unless otherwise noted.  Tests: (1) Upper Endoscopy (EGD)   EGD Upper Endoscopy       DONE     [facility]     [address]     [place], [place]  [address]           ENDOSCOPY PROCEDURE REPORT           PATIENT:  Mandy Webb, Mandy Webb  MR#:  [id number]     BIRTHDATE:  02-06-1946, 64 yrs. old  GENDER:  female     ENDOSCOPIST:  Rachael Fee, MD     PROCEDURE DATE:  06/21/2010     PROCEDURE:  EGD with biopsy     ASA CLASS:  Class II     INDICATIONS:  early satiety, mild weight loss           MEDICATIONS:  Fentanyl 25 mcg IV, Versed 3 mg IV     TOPICAL ANESTHETIC:  Exactacain Spray           DESCRIPTION OF PROCEDURE:   After the risks benefits and     alternatives of the procedure were thoroughly explained, informed     consent was obtained.  The LB GIF-H180 [id number] endoscope was     introduced through the mouth and advanced to the second portion of     the duodenum, without limitations.  The instrument was slowly     withdrawn as the mucosa was fully examined.     <<PROCEDUREIMAGES>>           There was moderate to severe pan-gastritis, most prominant in     proximal stomach. Biopsies taken to check for neoplasia, H. pylori     (see image2 and image5).  A hiatal hernia was found. This was 1cm     (see image6).  Otherwise the examination was normal (see image7,     image4, and image3).    Retroflexed views revealed no     abnormalities.    The scope was then withdrawn from the patient     and the procedure completed.           COMPLICATIONS:  None           ENDOSCOPIC IMPRESSION:     1) Moderate to severe gastritis; biopsied to check for H.     pylori, neoplasm     2) Small hiatal hernia     3) Otherwise normal examination           RECOMMENDATIONS:     1) Await biopsy results           ______________________________     Rachael Fee, MD           n.     eSIGNED:    Rachael Fee at 06/21/2010 09:23 AM           Eloisa Northern, [id number]  Note: An exclamation mark (!) indicates a result that was not dispersed into the flowsheet. Document Creation Date: 06/21/2010 9:23 AM _______________________________________________________________________  (1) Order result status: Final Collection or observation date-time: 06/21/2010 09:12 Requested date-time:  Receipt date-time:  Reported date-time:  Referring Physician:   Ordering Physician: Rob Bunting [phone]) Specimen Source:  Source: Launa Grill Order Number: [id number] Lab site:

## 2011-01-17 NOTE — Assessment & Plan Note (Signed)
GI PROBLEM LIST: 1. History of colon polyps/colonoscopy 2005 in [place] (7mm polyp),Colonoscopy 2010 found no polyps or cancers, positive melanosis throughout colon.  ++ Family history for colon cancer.  Surveillance colonoscopy 2015. 2. Questionable history of stomach polyps or Barrett's.  These were by endoscopies in [place].  Repeat esophagogastroduodenoscopy by Dr. Christella Hartigan January 2007 showed normal upper gastrointestinal evaluation.  No Barrett's, no polyps. 3. Chronic constipation.  Well treated with a variety of agents, including an herbal medicine that she takes, aloe vera, Miralax p.r.n.  4. intermitent abd pains: CT 08/2008 essentially negative, felt this was from chronic constipation.  Ultrasound abdomen on November 16, 2009, showed diffuse fatty infiltration of the liver, otherwise unremarkable. HIDA scan on December, 2010, showed normal gallbladder ejection fraction, otherwise an unremarkable study.   History of Present Illness Visit Type: follow up  Primary GI MD: Rob Bunting MD Primary Provider: [facility]  Requesting Provider: n/a Chief Complaint: Hosp f/u for epigastric pain History of Present Illness:      65 year old woman who was told to have repeat EGD by hospital doctor because of epigastric pains.   She has epigastric, luq constant pains that have been going on for 1-2 months.  The pain feels like a pinch.  Pressing on the abd she feels a knot in her abd.  She can get nausea.  Her mouth feels dry with bad taste in mouth.    she has gained 13 pounds since 2009 visit to GI.  Has been trying to limit diet, excercise.  she takes Prilosec once daily.           Current Medications (verified): 1)  Advair Diskus 500-50 Mcg/dose  Misc (Fluticasone-Salmeterol) .... Inhale 1 Puff Two Times A Day in The Am and The Pm 2)  Omeprazole 20 Mg  Tbec (Omeprazole) .... 2 Tablets in The Morning 3)  Miralax   Powd (Polyethylene Glycol 3350) .... Mix Iin 8 Oz Glass of Water Drink  Two Times A Day 4)  Flexeril 10 Mg Tabs (Cyclobenzaprine Hcl) .... Every Evening 5)  Fluticasone Propionate 50 Mcg/act Susp (Fluticasone Propionate) .... Take As Directed 6)  Glipizide 2.5 Mg Xr24h-Tab (Glipizide) .... Two Times A Day 7)  Proair Hfa 108 (90 Base) Mcg/act Aers (Albuterol Sulfate) .... As Directed 8)  Ramipril 10 Mg Caps (Ramipril) .... One Tablet By Mouth Once Daily 9)  Singulair 10 Mg Tabs (Montelukast Sodium) .... At Bedtime 10)  Multivitamins   Tabs (Multiple Vitamin) .... One Tablet By Mouth Once Daily  Allergies (verified): No Known Drug Allergies  Vital Signs:  Patient profile:   65 year old female Height:      60 inches Weight:      187 pounds BMI:     36.65 BSA:     1.82 Pulse rate:   98 / minute Pulse rhythm:   regular BP sitting:   128 / 80  (left arm) Cuff size:   regular  Vitals Entered By: Ok Anis CMA (December 21, 2009 3:06 PM)  Physical Exam  Additional Exam:  Constitutional: obese, otherwise generally well appearing Psychiatric: alert and oriented times 3 Abdomen: soft, non-tender, non-distended, normal bowel sounds    Impression & Recommendations:  Problem # 1:  dyspepsia, likely functional she had an upper endoscopy 2007 here in [place] and and other upper endoscopy a year and a half prior to that in [place]. Those were both completely normal. She feels she needs another one now for her dyspeptic  symptoms. I don't think that is necessarily needed at this point. She has gained 15 pounds in the past one to 2 years. She has had no overt GI bleeding and does not have dysphasia. I think for now I will simply change her proton pump inhibitor and see how she responds to that.  Patient Instructions: 1)  You have gained 13-15 pounds in past 1-2 years.  This can contribute to abdominal discomforts.  Need to focus on long term gradual weight loss, eating less calories than your body needs.   2)  Samples of PPI given, take one pill 20-30 min  prior to breakfast meal daily. Stop prilosec for now. 3)  Return to see Dr. Christella Hartigan in 3-4 weeks in follow up. 4)  A copy of this information will be sent to Dr. Reche Dixon. 5)  The medication list was reviewed and reconciled.  All changed / newly prescribed medications were explained.  A complete medication list was provided to the patient / caregiver.  Appended Document:  nexium samples given Samples Given #  4 -Lot Number:  [id number]

## 2011-01-17 NOTE — Letter (Signed)
Summary: Diabetic Instructions  [facility]  [address] [place], [place] [address]   Phone: [phone]  Fax: [phone]    Mandy Webb [birth year] MRN: [id number]   X   ORAL DIABETIC MEDICATION INSTRUCTIONS  The day before your procedure:   Take your diabetic pill as you do normally  The day of your procedure:   Do not take your diabetic pill    We will check your blood sugar levels during the admission process and again in Recovery before discharging you home  ________________________________________________________________________

## 2011-01-17 NOTE — Assessment & Plan Note (Signed)
GI PROBLEM LIST: 1. History of colon polyps/colonoscopy 2005 in [place] (7mm polyp),Colonoscopy 2010 found no polyps or cancers, positive melanosis throughout colon.  ++ Family history for colon cancer.  Surveillance colonoscopy 2015. 2. Questionable history of stomach polyps or Barrett's.  These were by endoscopies in [place].  Repeat esophagogastroduodenoscopy by Dr. Christella Hartigan January 2007 showed normal upper gastrointestinal evaluation.  No Barrett's, no polyps. 3. Chronic constipation.  Well treated with a variety of agents, including an herbal medicine that she takes, aloe vera, Miralax p.r.n.  4. intermitent abd pains: CT 08/2008 essentially negative, felt this was from chronic constipation.  Ultrasound abdomen on November 16, 2009, showed diffuse fatty infiltration of the liver, otherwise unremarkable. HIDA scan on December, 2010, showed normal gallbladder ejection fraction, otherwise an unremarkable study. 5. abnormal gastric biopsies, June 2011. Moderate to severe gastritis noted. Biopsies taken. No H. pylori however pathology suggested possible carcinoid reaction.    History of Present Illness Visit Type: Follow-up Visit Primary GI MD: Rob Bunting MD Primary Provider: [facility]  Requesting Provider: n/a Chief Complaint: discuss EGD History of Present Illness:     she still has pains.  We reviewed pathology results from recent EGD.  she understands that the biopsies were abnormal in that she requires repeat biopsies as well as octreotide scan.           Current Medications (verified): 1)  Advair Diskus 500-50 Mcg/dose  Misc (Fluticasone-Salmeterol) .... Inhale 1 Puff Two Times A Day in The Am and The Pm 2)  Miralax   Powd (Polyethylene Glycol 3350) .... Mix Iin 8 Oz Glass of Water Drink Two Times A Day 3)  Fluticasone Propionate 50 Mcg/act Susp (Fluticasone Propionate) .... Take As Directed 4)  Glipizide 2.5 Mg Xr24h-Tab (Glipizide) .... Two Times A Day 5)  Proair Hfa  108 (90 Base) Mcg/act Aers (Albuterol Sulfate) .... As Directed 6)  Ramipril 10 Mg Caps (Ramipril) .... One Tablet By Mouth Once Daily 7)  Singulair 10 Mg Tabs (Montelukast Sodium) .... At Bedtime 8)  Multivitamins   Tabs (Multiple Vitamin) .... One Tablet By Mouth Once Daily 9)  Ropinirole Hcl 0.5 Mg Tabs (Ropinirole Hcl) .Marland Kitchen.. 1 By Mouth At Bedtime 10)  Omeprazole 20 Mg Cpdr (Omeprazole) .... 2 By Mouth Daily 11)  Lipitor 20 Mg Tabs (Atorvastatin Calcium) .Marland Kitchen.. 1 By Mouth Once Daily 12)  Levothyroxine Sodium 75 Mcg Tabs (Levothyroxine Sodium) .Marland Kitchen.. 1 By Mouth Once Daily 13)  Gabapentin 600 Mg Tabs (Gabapentin) .Marland Kitchen.. 1 By Mouth Three Times A Day  Allergies (verified): No Known Drug Allergies  Family History: mom had colon cancer     Vital Signs:  Patient profile:   65 year old female Height:      60 inches Weight:      178.25 pounds BMI:     34.94 Pulse rate:   100 / minute Pulse rhythm:   regular BP sitting:   122 / 78  (left arm) Cuff size:   regular  Vitals Entered By: June McMurray CMA Duncan Dull) (July 26, 2010 1:31 PM)  Physical Exam  Additional Exam:  Constitutional: generally well appearing Psychiatric: alert and oriented times 3 Abdomen: soft, non-tender, non-distended, normal bowel sounds    Impression & Recommendations:  Problem # 1:  Abnormal gastric biopsies ? carcinoid. Will repeat EGD, biopsies. I will also set her up with an octreotide scan.  Patient Instructions: 1)  Needs EGD at [facility], with repeat biopsies. 2)  Needs octreotide scan for gastric carcinoid,  newly diagnosed. 3)  The medication list was reviewed and reconciled.  All changed / newly prescribed medications were explained.  A complete medication list was provided to the patient / caregiver.  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

## 2011-01-17 NOTE — Letter (Signed)
Summary: EGD Instructions  [facility]  [address] [place], [place] [address]   Phone: [phone]  Fax: [phone]       Mandy Webb    September 21, 1946    MRN: [id number]       Procedure Day /Date:06/21/10 TUE     Arrival Time: 800 am     Procedure Time:900 am     Location of Procedure:                    [facility] (4th Floor)    PREPARATION FOR ENDOSCOPY   On 06/21/10 THE DAY OF THE PROCEDURE:  1.   No solid foods, milk or milk products are allowed after midnight the night before your procedure.  2.   Do not drink anything colored red or purple.  Avoid juices with pulp.  No orange juice.  3.  You may drink clear liquids until 7 am , which is 2 hours before your procedure.                                                                                                CLEAR LIQUIDS INCLUDE: Water Jello Ice Popsicles Tea (sugar ok, no milk/cream) Powdered fruit flavored drinks Coffee (sugar ok, no milk/cream) Gatorade Juice: apple, white grape, white cranberry  Lemonade Clear bullion, consomm, broth Carbonated beverages (any kind) Strained chicken noodle soup Hard Candy   MEDICATION INSTRUCTIONS  Unless otherwise instructed, you should take regular prescription medications with a small sip of water as early as possible the morning of your procedure.  Diabetic patients - see separate instructions.           OTHER INSTRUCTIONS  You will need a responsible adult at least 67 years of age to accompany you and drive you home.   This person must remain in the waiting room during your procedure.  Wear loose fitting clothing that is easily removed.  Leave jewelry and other valuables at home.  However, you may wish to bring a book to read or an iPod/MP3 player to listen to music as you wait for your procedure to start.  Remove all body piercing jewelry and leave at home.  Total time from sign-in until discharge is approximately  2-3 hours.  You should go home directly after your procedure and rest.  You can resume normal activities the day after your procedure.  The day of your procedure you should not:   Drive   Make legal decisions   Operate machinery   Drink alcohol   Return to work  You will receive specific instructions about eating, activities and medications before you leave.    The above instructions have been reviewed and explained to me by   _______________________    I fully understand and can verbalize these instructions _____________________________ Date _________

## 2011-01-17 NOTE — Assessment & Plan Note (Signed)
GI PROBLEM LIST: 1. History of colon polyps/colonoscopy 2005 in [place] (7mm polyp),Colonoscopy 2010 found no polyps or cancers, positive melanosis throughout colon.  ++ Family history for colon cancer.  Surveillance colonoscopy 2015. 2. Questionable history of stomach polyps or Barrett's.  These were by endoscopies in [place].  Repeat esophagogastroduodenoscopy by Dr. Christella Hartigan January 2007 showed normal upper gastrointestinal evaluation.  No Barrett's, no polyps. 3. Chronic constipation.  Well treated with a variety of agents, including an herbal medicine that she takes, aloe vera, Miralax p.r.n.  4. intermitent abd pains: CT 08/2008 essentially negative, felt this was from chronic constipation.  Ultrasound abdomen on November 16, 2009, showed diffuse fatty infiltration of the liver, otherwise unremarkable. HIDA scan on December, 2010, showed normal gallbladder ejection fraction, otherwise an unremarkable study.   History of Present Illness Visit Type: Follow-up Visit Primary GI MD: Rob Bunting MD Primary Provider: Dala Dock  Requesting Provider: n/a Chief Complaint: constipation/ soft stool; LUQ pain History of Present Illness:     65 year old woman who I last saw about 2 months agosince then , she has been taking miralax two scoops a day. Still only having a BM every 2-3 days.  Having a BM usually helps her LUQ discomfort.  she describes the early satiety type sensation that she is very concerned about. She did have an EGD for you years ago by myself, there were no significant abnormalities at that point. She has lost 3-4 pounds in the past couple months.           Current Medications (verified): 1)  Advair Diskus 500-50 Mcg/dose  Misc (Fluticasone-Salmeterol) .... Inhale 1 Puff Two Times A Day in The Am and The Pm 2)  Miralax   Powd (Polyethylene Glycol 3350) .... Mix Iin 8 Oz Glass of Water Drink Two Times A Day 3)  Fluticasone Propionate 50 Mcg/act Susp (Fluticasone  Propionate) .... Take As Directed 4)  Glipizide 2.5 Mg Xr24h-Tab (Glipizide) .... Two Times A Day 5)  Proair Hfa 108 (90 Base) Mcg/act Aers (Albuterol Sulfate) .... As Directed 6)  Ramipril 10 Mg Caps (Ramipril) .... One Tablet By Mouth Once Daily 7)  Singulair 10 Mg Tabs (Montelukast Sodium) .... At Bedtime 8)  Multivitamins   Tabs (Multiple Vitamin) .... One Tablet By Mouth Once Daily 9)  Ropinirole Hcl 0.5 Mg Tabs (Ropinirole Hcl) .Marland Kitchen.. 1 By Mouth At Bedtime 10)  Omeprazole 20 Mg Cpdr (Omeprazole) .... 2 By Mouth Daily 11)  Lipitor 20 Mg Tabs (Atorvastatin Calcium) .Marland Kitchen.. 1 By Mouth Once Daily 12)  Levothyroxine Sodium 75 Mcg Tabs (Levothyroxine Sodium) .Marland Kitchen.. 1 By Mouth Once Daily 13)  Gabapentin 600 Mg Tabs (Gabapentin) .Marland Kitchen.. 1 By Mouth Three Times A Day  Allergies (verified): No Known Drug Allergies  Family History: mom had colon cancer  Vital Signs:  Patient profile:   65 year old female Height:      60 inches Weight:      181.25 pounds BMI:     35.53 Pulse rate:   104 / minute Pulse rhythm:   regular BP sitting:   132 / 74  (left arm) Cuff size:   regular  Vitals Entered By: June McMurray CMA Duncan Dull) (May 11, 2010 10:56 AM)  Physical Exam  Additional Exam:  Constitutional: obese, otherwise generally well appearing Psychiatric: alert and oriented times 3 Abdomen: soft, non-tender, non-distended, normal bowel sounds    Impression & Recommendations:  Problem # 1:  early satiety, mild weight loss previously she is told her that  her mother had colon cancer. Today she is not so sure where it started. She is very concerned about the possibility that she may have something serious in her stomach given her early satiety. I think it is unlikely however an EGD of a reasonable test to proceed with.  Problem # 2:  chronic constipation she will add fiber supplements to her MiraLax regimen.  Patient Instructions: 1)  You should begin taking citrucel powder fiber supplement (orange  flavor).  Start with a small spoonful and increase this over 1 week to a full, heaping spoonful daily.  You may notice some bloating when you first start the fiber, but that usually resolves after a few days. 2)  Continue taking miralax, two capfuls every single day. 3)  Return to see Dr. Christella Hartigan in 2-3 months. 4)  The medication list was reviewed and reconciled.  All changed / newly prescribed medications were explained.  A complete medication list was provided to the patient / caregiver.  Appended Document: Orders Update/EGD    Clinical Lists Changes  Problems: Added new problem of DYSPEPSIA (ICD-536.8) Orders: Added new Test order of EGD (EGD) - Signed

## 2011-01-17 NOTE — Procedures (Signed)
Summary: Upper Endoscopy  Patient: [name] Note: All result statuses are Final unless otherwise noted.  Tests: (1) Upper Endoscopy (EGD)   EGD Upper Endoscopy       DONE     [facility]     [address]     [place], [place]  [address]           ENDOSCOPY PROCEDURE REPORT           PATIENT:  Mandy Webb, Mandy Webb  MR#:  [id number]     BIRTHDATE:  [birth year], 64 yrs. old  GENDER:  female     ENDOSCOPIST:  Rachael Fee, MD     PROCEDURE DATE:  07/27/2010     PROCEDURE:  EGD with biopsy     ASA CLASS:  Class II     INDICATIONS:  gastritis, recent gastric biopsies suggested     carcinoid; here for repeat biopsies     MEDICATIONS:  Fentanyl 25 mcg IV, Versed 5 mg IV     TOPICAL ANESTHETIC:  none           DESCRIPTION OF PROCEDURE:   After the risks benefits and     alternatives of the procedure were thoroughly explained, informed     consent was obtained.  The LB GIF-H180 [id number] endoscope was     introduced through the mouth and advanced to the bulb of duodenum,     without limitations.  The instrument was slowly withdrawn as the     mucosa was fully examined.     <<PROCEDUREIMAGES>>     There was moderate pan-gastritis. Biopsies were taken from antrum,     distal body, mid body, proximal body and cardia of stomach. All     sent to pathology in separate jars (see image2, image3, image4,     and image5).  Otherwise the examination was normal (see image6 and     image1).    Retroflexed views revealed no abnormalities.    The     scope was then withdrawn from the patient and the procedure     completed.           COMPLICATIONS:  None           ENDOSCOPIC IMPRESSION:     1) Moderate gastritis; biopsied extensively     2) Otherwise normal examination           RECOMMENDATIONS:     Await final biopsies as well as results of Octroescan.           ______________________________     Rachael Fee, MD           n.     eSIGNED:   Rachael Fee at 07/27/2010  02:26 PM           Eloisa Northern, [id number]  Note: An exclamation mark (!) indicates a result that was not dispersed into the flowsheet. Document Creation Date: 07/27/2010 2:28 PM _______________________________________________________________________  (1) Order result status: Final Collection or observation date-time: 07/27/2010 14:21 Requested date-time:  Receipt date-time:  Reported date-time:  Referring Physician:   Ordering Physician: Rob Bunting [phone]) Specimen Source:  Source: Launa Grill Order Number: [id number] Lab site:

## 2011-01-17 NOTE — Letter (Signed)
Summary: EGD Instructions  [facility]  [address] [place], [place] [address]   Phone: [phone]  Fax: [phone]       Mandy Webb    1946/05/02    MRN: [id number]       Procedure Day /Date:07/27/10  WED     Arrival Time: 2 pm     Procedure Time:3 pm     Location of Procedure:                    [facility] (4th Floor)   PREPARATION FOR ENDOSCOPY   On 07/27/10 THE DAY OF THE PROCEDURE:  1.   No solid foods, milk or milk products are allowed after midnight the night before your procedure.  2.   Do not drink anything colored red or purple.  Avoid juices with pulp.  No orange juice.  3.  You may drink clear liquids until1 pm , which is 2 hours before your procedure.                                                                                                CLEAR LIQUIDS INCLUDE: Water Jello Ice Popsicles Tea (sugar ok, no milk/cream) Powdered fruit flavored drinks Coffee (sugar ok, no milk/cream) Gatorade Juice: apple, white grape, white cranberry  Lemonade Clear bullion, consomm, broth Carbonated beverages (any kind) Strained chicken noodle soup Hard Candy   MEDICATION INSTRUCTIONS  Unless otherwise instructed, you should take regular prescription medications with a small sip of water as early as possible the morning of your procedure.  Diabetic patients - see separate instructions.             OTHER INSTRUCTIONS  You will need a responsible adult at least 67 years of age to accompany you and drive you home.   This person must remain in the waiting room during your procedure.  Wear loose fitting clothing that is easily removed.  Leave jewelry and other valuables at home.  However, you may wish to bring a book to read or an iPod/MP3 player to listen to music as you wait for your procedure to start.  Remove all body piercing jewelry and leave at home.  Total time from sign-in until discharge is approximately  2-3 hours.  You should go home directly after your procedure and rest.  You can resume normal activities the day after your procedure.  The day of your procedure you should not:   Drive   Make legal decisions   Operate machinery   Drink alcohol   Return to work  You will receive specific instructions about eating, activities and medications before you leave.    The above instructions have been reviewed and explained to me by   _______________________    I fully understand and can verbalize these instructions _____________________________ Date _________

## 2011-01-17 NOTE — Letter (Signed)
Summary: Appointment Reminder  [facility]  [address] [place], [place] [address]   Phone: [phone]  Fax: [phone]        September 05, 2010 MRN: [id number]    [facility] [address] [place], [place]  [address]    Dear Ms. Martino,   We have been unable to reach you by phone to schedule a follow up   appointment that was recommended for you by Dr. Christella Hartigan . It is very   important that we reach you to schedule an appointment. We hope that you  allow [place] to participate in your health care needs. Please contact us at  [phone] at your earliest convenience to schedule your appointment.     Sincerely,    Chales Abrahams CMA (AAMA)

## 2011-01-17 NOTE — Progress Notes (Signed)
Summary: triage/congested  Phone Note Call from Patient   Caller: Patient Reason for Call: Talk to Nurse Summary of Call: Patient states she saw Amy for Bronchitis on 05/18/10  She finished her Z-pak on 05/22/10 and is using her pro-air and Advair inhaler.Marland KitchenMarland KitchenShe states she is going out of town on Thursday and wants to know if she can have anything else since she feels she is still very congested..Patient went today 05/24/10 for chest xray which was normal...patient has lab work that is pending.Marland KitchenMarland KitchenI spoke with Amy and she suggested an Acute visit. Initial call taken by: Conchita Paris,  May 24, 2010 3:08 PM

## 2011-01-17 NOTE — Assessment & Plan Note (Signed)
GI PROBLEM LIST: 1. History of colon polyps/colonoscopy 2005 in [place] (7mm polyp),Colonoscopy 2010 found no polyps or cancers, positive melanosis throughout colon.  ++ Family history for colon cancer.  Surveillance colonoscopy 2015. 2. Questionable history of stomach polyps or Barrett's.  These were by endoscopies in [place].  Repeat esophagogastroduodenoscopy by Dr. Christella Hartigan January 2007 showed normal upper gastrointestinal evaluation.  No Barrett's, no polyps. 3. Chronic constipation.  Well treated with a variety of agents, including an herbal medicine that she takes, aloe vera, Miralax p.r.n.  4. intermitent abd pains: CT 08/2008 essentially negative, felt this was from chronic constipation.  Ultrasound abdomen on November 16, 2009, showed diffuse fatty infiltration of the liver, otherwise unremarkable. HIDA scan on December, 2010, showed normal gallbladder ejection fraction, otherwise an unremarkable study.   History of Present Illness Visit Type: Follow-up Visit Primary GI MD: Rob Bunting MD Primary Provider: [facility]  Requesting Provider: n/a Chief Complaint: 2 month F/U History of Present Illness:     since her last visit 2 months, she has been taking miralax 3-4 times a week and yet still only has a BM 1-2 times a week.  At her last visit I very clearly recommended that she take miralax every single day and increase to miralax two scoops a day if needed.  she has not been doing that however. She is still bothered by intermittent left upper quadrant pains that are sometimes improved when she has a good BM.           Current Medications (verified): 1)  Advair Diskus 500-50 Mcg/dose  Misc (Fluticasone-Salmeterol) .... Inhale 1 Puff Two Times A Day in The Am and The Pm 2)  Miralax   Powd (Polyethylene Glycol 3350) .... Mix Iin 8 Oz Glass of Water Drink Two Times A Day 3)  Fluticasone Propionate 50 Mcg/act Susp (Fluticasone Propionate) .... Take As Directed 4)  Glipizide 2.5 Mg  Xr24h-Tab (Glipizide) .... Two Times A Day 5)  Proair Hfa 108 (90 Base) Mcg/act Aers (Albuterol Sulfate) .... As Directed 6)  Ramipril 10 Mg Caps (Ramipril) .... One Tablet By Mouth Once Daily 7)  Singulair 10 Mg Tabs (Montelukast Sodium) .... At Bedtime 8)  Multivitamins   Tabs (Multiple Vitamin) .... One Tablet By Mouth Once Daily 9)  Ropinirole Hcl 0.5 Mg Tabs (Ropinirole Hcl) .Marland Kitchen.. 1 By Mouth At Bedtime 10)  Omeprazole 20 Mg Cpdr (Omeprazole) .... 2 By Mouth Daily 11)  Lipitor 20 Mg Tabs (Atorvastatin Calcium) .Marland Kitchen.. 1 By Mouth Once Daily 12)  Levothyroxine Sodium 75 Mcg Tabs (Levothyroxine Sodium) .Marland Kitchen.. 1 By Mouth Once Daily 13)  Gabapentin 600 Mg Tabs (Gabapentin) .Marland Kitchen.. 1 By Mouth Three Times A Day  Allergies (verified): No Known Drug Allergies  Vital Signs:  Patient profile:   65 year old female Height:      60 inches Weight:      183 pounds BMI:     35.87 BSA:     1.80 Pulse rate:   82 / minute Pulse rhythm:   regular BP sitting:   110 / 68  (left arm)  Vitals Entered By: Merri Ray CMA Duncan Dull) (March 15, 2010 2:07 PM)  Physical Exam  Additional Exam:  Constitutional: Morbidly obese, otherwise generally well appearing Psychiatric: alert and oriented times 3 Abdomen: soft, non-tender, non-distended, normal bowel sounds    Impression & Recommendations:  Problem # 1:  chronic constipation I again recommended that she increase her MiraLax so that she takes it every single day. I  have recommended this to her numerous times that she is not doing. I think it is we can get her bowels moving on a more regular basis, 3-4 times a week, her intermittent abdominal pains will hopefully improve. She has had a very thorough workup otherwise including CAT scan, ultrasound, colonoscopy, EGD, HIDA scan. Those results are summarized above. She will return to see me in 3 months and sooner if needed  Patient Instructions: 1)  Increase the miralax to one full capfull EVERY SINGLE DAY, not  every other day.  If no improvement after 3-4 weeks, then increase to 2 capfulls EVERY SINGLE DAY. 2)  Please schedule a follow-up appointment in 3 months. 3)  The medication list was reviewed and reconciled.  All changed / newly prescribed medications were explained.  A complete medication list was provided to the patient / caregiver.

## 2011-01-17 NOTE — Progress Notes (Signed)
Summary: results request  Phone Note Call from Patient Call back at Home Phone [phone]   Caller: Patient Call For: Dr. Christella Hartigan Reason for Call: Lab or Test Results Summary of Call: would like EGD results Initial call taken by: Vallarie Mare,  July 29, 2010 10:40 AM  Follow-up for Phone Call        advised pt we are still waiting on the path report and the octreotide scan.  I will call her when available. Follow-up by: Chales Abrahams CMA Duncan Dull),  July 29, 2010 1:27 PM

## 2011-01-17 NOTE — Letter (Signed)
Summary: Appt Reminder 2  [facility]  [address] [place], [place] [address]   Phone: [phone]  Fax: [phone]        July 18, 2010 MRN: [id number]    [facility] [address] [place], [place]  [address]    Dear Ms. Kessinger,   You have a return appointment with Dr. Christella Hartigan on 07-26-10 at 3:15pm.  Please remember to bring a complete list of the medicines you are taking, your insurance card and your co-pay.  If you have to cancel or reschedule this appointment, please call before 5:00 pm the evening before to avoid a cancellation fee.  If you have any questions or concerns, please call [phone].    Sincerely,  [employer]

## 2011-01-19 NOTE — Discharge Summary (Signed)
Summary: Hypertensive Emergency      NAME:  Mandy Webb, Mandy Webb             ACCOUNT NO.:  [id number]      MEDICAL RECORD NO.:  [id number]          PATIENT TYPE:  INP      LOCATION:  3740                         FACILITY:  [facility]      PHYSICIAN:  Talmage Nap, MD  DATE OF BIRTH:  12-03-1946      DATE OF ADMISSION:  11/18/2010   DATE OF DISCHARGE:  11/20/2010                            DISCHARGE SUMMARY - REFERRING         PRIMARY CARE PHYSICIAN:  [facility].      DISCHARGE DIAGNOSES:   1. Hypertensive emergency.  On discharge blood pressure stabilized.   2. History of pituitary tumor resected.   3. Gastroesophageal reflux disease.   4. Type 2 diabetes mellitus.   5. Hyperlipidemia.   6. Hypothyroidism.   7. Peripheral neuropathy.   8. Depression.   9. Chronic obstructive pulmonary disease.      The patient is a 65 year old Hispanic female with history of   hypertension, pituitary tumor that was resected who was admitted to the   hospital by Dr. Mariea Stable on November 18, 2010 with complaints of   headache and chest pressure of unspecified duration.  The chest pressure   was said to be radiating to the neck with associated shortness of   breath.  She also claims she had headache and the headache, according to   the initial history and physical, was described as 10/10, worsening   intensity and was relieved with nitroglycerin in the ED.  Blood pressure   at that time was 210/90 and subsequently it was 196/95 after receiving   nitro paste.  She, however, denied any history of fever.  No chills, no   rigor and subsequently the patient was admitted for blood pressure   stabilization.      PREADMISSION MEDICATIONS:  Include:   1. Alphagan (brimonidine ophthalmic solution) both eyes 1 drop t.i.d.   2. Reglan (metoclopramide) 10 mg one p.o. daily at bedtime.   3. Gabapentin 600 mg one p.o. t.i.d.   4. Levothyroxine 75 mcg one p.o. daily.   5. Ropinirole 0.5 mg one p.o.  q.h.s.   6. Lipitor (atorvastatin) 20 mg one p.o. daily at bedtime.   7. Glipizide 5 mg half a tablet p.o. b.i.d.   8. Ramipril 5 mg one p.o. q.a.m.   9. ProAir (albuterol inhaler) 1 puff three times a day p.r.n.   10.Advair Diskus (fluticasone/salmeterol)  500/50 one puff b.i.d.   11.Fluticasone nasal spray each nostril 1 spray daily at bedtime       p.r.n.   12.Singulair (montelukast) 10 mg one p.o. b.i.d.   13.Omeprazole 20 mg one p.o. daily.      NO KNOWN ALLERGIES.      SOCIAL HISTORY:  No recent history of alcohol or tobacco use.  The   patient is single.  Originally from [place].      FAMILY HISTORY:  York Spaniel to be noncontributory.      REVIEW OF SYSTEMS:  Essentially documented in the initial history and   physical.  At time the patient was seen by the admitting physician, vital signs:   Temperature 97.9, blood pressure was 196/95, heart rate was 90,   respiratory rate 18, was saturating 96% on the room air.   HEENT: Pupils were reactive to light and extraocular muscles were   intact.   NECK: She had no jugular venous distention.  No carotid bruit.  No   lymphadenopathy.   CHEST: Was said to be clear to auscultation.  No adventitious sounds.   Heart sounds are 1 and 2 with soft systolic murmur and there was   reproducible tenderness at the left sternal border.   ABDOMEN: Said to be soft, nontender.  Liver, spleen tip not palpable.   Bowels sounds are positive.   EXTREMITIES: Showed no pedal edema.   NEUROLOGIC: Exam did not show any lateralizing signs.      LABORATORY RESULTS:  Initial Chem-8 on admission shows sodium of 140,   potassium of 3.9, chloride of 102, glucose was 64, BUN is 11,   creatinine 0.8.  Cardiac marker: Troponin-I less than 0.05, less than   0.05 and 0.01 respectively.  Basic metabolic panel shows sodium of 140,   potassium of 4.0, chloride of 104 with a bicarb of 30, glucose is 167,   BUN is 9, creatinine 0.78.  Urinalysis showed small leukocyte  esterase.   Urine microscopy showed WBC 3 to 6 with rare bacteria and a complete   blood count with differential showed WBC of 6.2, hemoglobin of 13.3,   hematocrit of 39.5, MCV of 79.2 with a platelet count of 178.  Lipid   panel normal.  Hepatic panel normal.  Lipase 35, normal.  Urine drug   screen unremarkable.  Hemoglobin A1c 5.8.  A repeat comprehensive   metabolic panel done on November 20, 2010 showed sodium of 138, potassium   of 4.3, chloride of 101 with a bicarb of 30, glucose is 136, BUN is 11,   creatinine 0.77, magnesium level is 2.0.  Complete blood count with   differential showed WBC of 5.6, hemoglobin of 12.9, hematocrit 38.7, MCV   of 79.8 with a platelet count of 214.  EKG done on admission showed a   normal sinus rhythm with a rate of 70, no acute ST or T-wave changes   noted.  Imaging study done include chest x-ray normal.  CT of the head   without contrast showed chronic microvascular ischemic angiopathy.   There is air-fluid level on the left maxillary and left sphenoid sinus.   MRI of the brain without contrast showed no acute intracranial   abnormality.  There is stable atrophy and extensive white matter   disease.  A 2-D echo showed normal left ventricular cavity with EF of   55% to 60%.  There is no regional wall motion abnormalities seen.  PA   pressure 136 mmHg.      HOSPITAL COURSE:  The patient was admitted to telemetry.  She was placed   on aspirin 81 mg p.o. daily.  Pain control was with Tylenol as well as   morphine.  Also given to the patient includes Zofran for nausea and   Lovenox 40 mg subcu q.24 for DVT prophylaxis.  The patient was also   restarted on her home medications and this included Advair, MiraLAX,   albuterol nebulizers, ramipril, Singulair, ropinirole,  Prilosec,   Lipitor, Synthroid, Neurontin and Reglan.  The patient was seen by me   for the very first time in this  index admission on November 18, 2010 and   at this time she feels better,  complained about a slight headache.  She   denies any chest pain or shortness of breath.  Examination of the   patient at this time was unremarkable.  Her vital signs: Blood pressure   was 122/71, pulse 79, respiratory rate 19, and temperature of 97.4 and   following this evaluation, a 2-D echo was ordered as well as MRI of the   brain without contrast and the results are described above.  The patient   was subsequently followed by me on a daily basis and made remarkable   progress.  She was seen by me today prior to discharge.  Denied any   chest pain.  Denied any shortness of breath.  She complained of   constipation which the patient gets relieved by taking MiraLAX and she   also demanded for Vicodin for her peripheral neuropathy.  Examination   was, however, essentially unremarkable.  Her vital signs: Blood pressure   is 135/74, pulse 84, respiratory rate is 20, temperature is 97.5.  No   headaches, medically stable.  Plan is for the patient to be discharged   home today on activity as tolerated, low-sodium, low-cholesterol diet,   diabetic ADA diet as well.  She is advised to be followed up by her   primary care physician in 1 to 2 weeks.      Medication to be taken at home will include:   1. Hydrocodone/APAP 5/325.   2. Vicodin/Norco/Lortab 1 to 2 tablets p.o. q.4 p.r.n.   3. Polyethylene glycol 3350 powder (MiraLAX) 1 packet by mouth daily.   4. Advair Diskus (fluticasone/salmeterol) 500/50 one puff inhaled       b.i.d.   5. Alphagan (brimonidine ophthalmic solution) 1 drop both eyes three       times a day.   6. Fluticasone nasal spray one spray each nostril daily at bedtime       p.r.n.   7. Gabapentin 1 tablet p.o. three times a day.   8. Glipizide 5 mg half a tablet p.o. b.i.d.   9. Levothyroxine 75 mcg one p.o. q.a.m.   10.Lipitor (atorvastatin) 20 mg one p.o. daily at bedtime.   11.Omeprazole 20 mg 2 capsules by mouth q.a.m.   12.ProAir (albuterol inhaler) 1 puff t.i.d.  p.r.n.   13.Ramipril 5 mg one p.o. q.a.m.   14.Reglan (metoclopramide) 10 mg one p.o. daily at bedtime.   15.Ropinirole 0.5 mg 1 tablet p.o.tid   16.Singulair (montelukast) 10 mg one p.o. b.i.d.               Talmage Nap, MD               CN/MEDQ  D:  11/20/2010  T:  11/20/2010  Job:  [id number]      cc:   Primary Care Physician      Electronically Signed by Talmage Nap  on 11/20/2010 05:06:43 PM

## 2011-02-20 ENCOUNTER — Ambulatory Visit (INDEPENDENT_AMBULATORY_CARE_PROVIDER_SITE_OTHER): Payer: Medicare Other | Admitting: Gastroenterology

## 2011-02-20 ENCOUNTER — Encounter: Payer: Self-pay | Admitting: Gastroenterology

## 2011-02-20 DIAGNOSIS — K589 Irritable bowel syndrome without diarrhea: Secondary | ICD-10-CM

## 2011-02-20 DIAGNOSIS — R1013 Epigastric pain: Secondary | ICD-10-CM

## 2011-02-27 LAB — CK TOTAL AND CKMB (NOT AT ARMC)
CK, MB: 1.6 ng/mL (ref 0.3–4.0)
Relative Index: INVALID (ref 0.0–2.5)
Total CK: 76 U/L (ref 7–177)

## 2011-02-27 LAB — HEMOGLOBIN A1C: Mean Plasma Glucose: 137 mg/dL — ABNORMAL HIGH (ref <117)

## 2011-02-27 LAB — CBC
HCT: 38.2 % (ref 36.0–46.0)
HCT: 39.5 % (ref 36.0–46.0)
MCH: 26.6 pg (ref 26.0–34.0)
MCH: 26.7 pg (ref 26.0–34.0)
MCV: 79.2 fL (ref 78.0–100.0)
MCV: 79.8 fL (ref 78.0–100.0)
MCV: 79.9 fL (ref 78.0–100.0)
Platelets: 178 10*3/uL (ref 150–400)
Platelets: 214 10*3/uL (ref 150–400)
RDW: 13.9 % (ref 11.5–15.5)
RDW: 14 % (ref 11.5–15.5)
RDW: 14.1 % (ref 11.5–15.5)
WBC: 4.7 10*3/uL (ref 4.0–10.5)

## 2011-02-27 LAB — DIFFERENTIAL
Basophils Absolute: 0 10*3/uL (ref 0.0–0.1)
Basophils Absolute: 0 10*3/uL (ref 0.0–0.1)
Basophils Absolute: 0 10*3/uL (ref 0.0–0.1)
Basophils Relative: 0 % (ref 0–1)
Eosinophils Relative: 2 % (ref 0–5)
Lymphocytes Relative: 35 % (ref 12–46)
Lymphs Abs: 2.1 10*3/uL (ref 0.7–4.0)
Monocytes Absolute: 0.4 10*3/uL (ref 0.1–1.0)
Monocytes Absolute: 0.4 10*3/uL (ref 0.1–1.0)
Monocytes Relative: 6 % (ref 3–12)
Neutro Abs: 2.3 10*3/uL (ref 1.7–7.7)
Neutro Abs: 3.1 10*3/uL (ref 1.7–7.7)
Neutro Abs: 3.6 10*3/uL (ref 1.7–7.7)
Neutrophils Relative %: 50 % (ref 43–77)
Neutrophils Relative %: 55 % (ref 43–77)

## 2011-02-27 LAB — COMPREHENSIVE METABOLIC PANEL
Albumin: 3.4 g/dL — ABNORMAL LOW (ref 3.5–5.2)
Alkaline Phosphatase: 40 U/L (ref 39–117)
BUN: 10 mg/dL (ref 6–23)
BUN: 11 mg/dL (ref 6–23)
Chloride: 101 mEq/L (ref 96–112)
Chloride: 104 mEq/L (ref 96–112)
Creatinine, Ser: 0.77 mg/dL (ref 0.4–1.2)
GFR calc non Af Amer: >60 mL/min (ref >60)
Glucose, Bld: 147 mg/dL — ABNORMAL HIGH (ref 70–99)
Potassium: 3.9 mEq/L (ref 3.5–5.1)
Total Bilirubin: 0.5 mg/dL (ref 0.3–1.2)
Total Bilirubin: 0.6 mg/dL (ref 0.3–1.2)
Total Protein: 6.6 g/dL (ref 6.0–8.3)
Total Protein: 7 g/dL (ref 6.0–8.3)

## 2011-02-27 LAB — URINALYSIS, ROUTINE W REFLEX MICROSCOPIC
Protein, ur: NEGATIVE mg/dL
Specific Gravity, Urine: 1.009 (ref 1.005–1.030)
Urobilinogen, UA: 0.2 mg/dL (ref 0.0–1.0)

## 2011-02-27 LAB — POCT I-STAT, CHEM 8
BUN: 11 mg/dL (ref 6–23)
Calcium, Ion: 1.05 mmol/L — ABNORMAL LOW (ref 1.12–1.32)
Creatinine, Ser: 0.8 mg/dL (ref 0.4–1.2)
Hemoglobin: 13.9 g/dL (ref 12.0–15.0)
Sodium: 140 mEq/L (ref 135–145)
TCO2: 30 mmol/L (ref 0–100)

## 2011-02-27 LAB — LIPID PANEL
HDL: 47 mg/dL (ref >39)
LDL Cholesterol: 93 mg/dL (ref 0–99)
Total CHOL/HDL Ratio: 3.5 RATIO
Triglycerides: 113 mg/dL (ref <150)
VLDL: 23 mg/dL (ref 0–40)

## 2011-02-27 LAB — POCT CARDIAC MARKERS
CKMB, poc: 1.1 ng/mL (ref 1.0–8.0)
CKMB, poc: 1.3 ng/mL (ref 1.0–8.0)
Myoglobin, poc: 71.7 ng/mL (ref 12–200)
Myoglobin, poc: 72.7 ng/mL (ref 12–200)

## 2011-02-27 LAB — BASIC METABOLIC PANEL
BUN: 9 mg/dL (ref 6–23)
CO2: 30 mEq/L (ref 19–32)
Chloride: 104 mEq/L (ref 96–112)
Creatinine, Ser: 0.78 mg/dL (ref 0.4–1.2)
Potassium: 4 mEq/L (ref 3.5–5.1)

## 2011-02-27 LAB — URINE MICROSCOPIC-ADD ON

## 2011-02-27 LAB — HEPATIC FUNCTION PANEL
ALT: 24 U/L (ref 0–35)
AST: 24 U/L (ref 0–37)
Alkaline Phosphatase: 44 U/L (ref 39–117)
Bilirubin, Direct: 0.2 mg/dL (ref 0.0–0.3)
Total Bilirubin: 0.7 mg/dL (ref 0.3–1.2)

## 2011-02-27 LAB — GLUCOSE, CAPILLARY
Glucose-Capillary: 118 mg/dL — ABNORMAL HIGH (ref 70–99)
Glucose-Capillary: 145 mg/dL — ABNORMAL HIGH (ref 70–99)
Glucose-Capillary: 212 mg/dL — ABNORMAL HIGH (ref 70–99)
Glucose-Capillary: 238 mg/dL — ABNORMAL HIGH (ref 70–99)

## 2011-02-27 LAB — LIPASE, BLOOD: Lipase: 35 U/L (ref 11–59)

## 2011-02-27 LAB — RAPID URINE DRUG SCREEN, HOSP PERFORMED
Amphetamines: NOT DETECTED
Tetrahydrocannabinol: NOT DETECTED

## 2011-02-28 NOTE — Assessment & Plan Note (Signed)
GI PROBLEM LIST: 1. History of colon polyps/colonoscopy 2005 in [place] (7mm polyp),Colonoscopy 2010 found no polyps or cancers, positive melanosis throughout colon.  ++ Family history for colon cancer.  Surveillance colonoscopy 2015. 2. Questionable history of stomach polyps or Barrett's.  These were by endoscopies in [place].  Repeat esophagogastroduodenoscopy by Dr. Christella Hartigan January 2007 showed normal upper gastrointestinal evaluation.  No Barrett's, no polyps. 3. Chronic constipation.  Well treated with a variety of agents, including an herbal medicine that she takes, aloe vera, Miralax p.r.n. 4. intermitent abd pains: CT 08/2008 essentially negative, felt this was from chronic constipation.  Ultrasound abdomen on November 16, 2009, showed diffuse fatty infiltration of the liver, otherwise unremarkable. HIDA scan on December, 2010, showed normal gallbladder ejection fraction, otherwise an unremarkable study. 5. abnormal gastric biopsies, June 2011. Moderate to severe gastritis noted. Biopsies taken. No H. pylori however pathology suggested possible carcinoid reaction.  august 2011; August, 2000 11 repeat EGD performed; pan gastritis, pathology showed gastritis EC hyperplasia, NE features.  Octreotide scan normal.  2011, intrinsic factor antibodies negative.   History of Present Illness Primary GI MD: Rob Bunting MD Primary Provider: [facility]  Requesting Provider: n/a Chief Complaint: f/u constipation, Pt's bowels are doing better since starting Miralax. Pt states the reglan has been helping with Nausea and fullness.  History of Present Illness:         Pleasant 65 year old woman whom I last saw a few months ago. since then she has noticed a bit improvement with bloating in AM after waking up since starting at bedtime reglan.  She is moving her bowels easier now with miralax powder, almost a bit too easily and can be loose.             Current Medications (verified): 1)  Advair  Diskus 500-50 Mcg/dose  Misc (Fluticasone-Salmeterol) .... Inhale 1 Puff Two Times A Day in The Am and The Pm 2)  Miralax   Powd (Polyethylene Glycol 3350) .... Mix Iin 8 Oz Glass of Water Drink Two Times A Day 3)  Fluticasone Propionate 50 Mcg/act Susp (Fluticasone Propionate) .... Take As Directed 4)  Glipizide 2.5 Mg Xr24h-Tab (Glipizide) .... Two Times A Day 5)  Proair Hfa 108 (90 Base) Mcg/act Aers (Albuterol Sulfate) .... As Directed 6)  Ramipril 10 Mg Caps (Ramipril) .... One Tablet By Mouth Once Daily 7)  Singulair 10 Mg Tabs (Montelukast Sodium) .... At Bedtime 8)  Multivitamins   Tabs (Multiple Vitamin) .... One Tablet By Mouth Once Daily 9)  Ropinirole Hcl 0.5 Mg Tabs (Ropinirole Hcl) .Marland Kitchen.. 1 By Mouth At Bedtime 10)  Omeprazole 20 Mg Cpdr (Omeprazole) .... 2 By Mouth Daily 11)  Lipitor 20 Mg Tabs (Atorvastatin Calcium) .Marland Kitchen.. 1 By Mouth Once Daily 12)  Levothyroxine Sodium 75 Mcg Tabs (Levothyroxine Sodium) .Marland Kitchen.. 1 By Mouth Once Daily 13)  Gabapentin 300 Mg Caps (Gabapentin) .... One Tablet By Mouth Four Times A Day 14)  Reglan 10 Mg  Tabs (Metoclopramide Hcl) .... Take One Pill At Bedtime Every Night  Allergies (verified): No Known Drug Allergies  Vital Signs:  Patient profile:   65 year old female Height:      60 inches Weight:      179 pounds BMI:     35.08 Pulse rate:   90 / minute Pulse rhythm:   regular BP sitting:   142 / 84  (left arm) Cuff size:   regular  Vitals Entered By: Christie Nottingham CMA Duncan Dull) (February 20, 2011  9:27 AM)  Physical Exam  Additional Exam:  Constitutional: generally well appearing Psychiatric: alert and oriented times 3 Abdomen: soft, non-tender, non-distended, normal bowel sounds    Impression & Recommendations:  Problem # 1:   dyspepsia  improved on nightly Reglan. She will continue this.  Problem # 2:   constipation  she'll continue MiraLax twice daily and we'll add fiber supplements. She knows to get in touch with me on as needed  basis.  Patient Instructions: 1)  Continue reglan 10mg  at bedtime, this will be refilled as needed for 1 year. 2)  You should begin taking citrucel powder fiber supplement (orange flavor).  Start with a small spoonful and increase this over 1 week to a full, heaping spoonful daily.  You may notice some bloating when you first start the fiber, but that usually resolves after a few days. 3)  Use preparation H as needed for hemorrhoids discomfort. 4)  Continue to take the miralax every day. 5)  Return to Dr. Christella Hartigan as needed. 6)  The medication list was reviewed and reconciled.  All changed / newly prescribed medications were explained.  A complete medication list was provided to the patient / caregiver.

## 2011-03-03 LAB — GLUCOSE, CAPILLARY
Glucose-Capillary: 121 mg/dL — ABNORMAL HIGH (ref 70–99)
Glucose-Capillary: 131 mg/dL — ABNORMAL HIGH (ref 70–99)

## 2011-03-05 LAB — GLUCOSE, CAPILLARY: Glucose-Capillary: 141 mg/dL — ABNORMAL HIGH (ref 70–99)

## 2011-03-21 LAB — GLUCOSE, CAPILLARY
Glucose-Capillary: 100 mg/dL — ABNORMAL HIGH (ref 70–99)
Glucose-Capillary: 103 mg/dL — ABNORMAL HIGH (ref 70–99)
Glucose-Capillary: 119 mg/dL — ABNORMAL HIGH (ref 70–99)
Glucose-Capillary: 122 mg/dL — ABNORMAL HIGH (ref 70–99)
Glucose-Capillary: 162 mg/dL — ABNORMAL HIGH (ref 70–99)
Glucose-Capillary: 96 mg/dL (ref 70–99)
Glucose-Capillary: 98 mg/dL (ref 70–99)

## 2011-03-21 LAB — CBC
HCT: 38.2 % (ref 36.0–46.0)
Hemoglobin: 12.8 g/dL (ref 12.0–15.0)
RBC: 4.68 MIL/uL (ref 3.87–5.11)
WBC: 5.9 10*3/uL (ref 4.0–10.5)

## 2011-03-21 LAB — LIPID PANEL
LDL Cholesterol: 81 mg/dL (ref 0–99)
Triglycerides: 118 mg/dL (ref <150)
VLDL: 24 mg/dL (ref 0–40)

## 2011-03-21 LAB — BASIC METABOLIC PANEL
Calcium: 8.4 mg/dL (ref 8.4–10.5)
GFR calc Af Amer: >60 mL/min (ref >60)
GFR calc non Af Amer: >60 mL/min (ref >60)
Glucose, Bld: 105 mg/dL — ABNORMAL HIGH (ref 70–99)
Potassium: 4 mEq/L (ref 3.5–5.1)
Sodium: 138 mEq/L (ref 135–145)

## 2011-03-21 LAB — HEMOGLOBIN A1C
Hgb A1c MFr Bld: 6.6 % — ABNORMAL HIGH (ref 4.6–6.1)
Mean Plasma Glucose: 143 mg/dL

## 2011-03-21 LAB — HEPATIC FUNCTION PANEL
Albumin: 3.4 g/dL — ABNORMAL LOW (ref 3.5–5.2)
Indirect Bilirubin: 0.4 mg/dL (ref 0.3–0.9)
Total Protein: 6.5 g/dL (ref 6.0–8.3)

## 2011-03-22 LAB — COMPREHENSIVE METABOLIC PANEL
AST: 46 U/L — ABNORMAL HIGH (ref 0–37)
Albumin: 3.9 g/dL (ref 3.5–5.2)
Alkaline Phosphatase: 46 U/L (ref 39–117)
Chloride: 101 mEq/L (ref 96–112)
GFR calc Af Amer: >60 mL/min (ref >60)
Potassium: 4.2 mEq/L (ref 3.5–5.1)
Sodium: 140 mEq/L (ref 135–145)
Total Bilirubin: 0.6 mg/dL (ref 0.3–1.2)

## 2011-03-22 LAB — GLUCOSE, CAPILLARY
Glucose-Capillary: 127 mg/dL — ABNORMAL HIGH (ref 70–99)
Glucose-Capillary: 130 mg/dL — ABNORMAL HIGH (ref 70–99)
Glucose-Capillary: 136 mg/dL — ABNORMAL HIGH (ref 70–99)
Glucose-Capillary: 161 mg/dL — ABNORMAL HIGH (ref 70–99)
Glucose-Capillary: 66 mg/dL — ABNORMAL LOW (ref 70–99)

## 2011-03-22 LAB — CARDIAC PANEL(CRET KIN+CKTOT+MB+TROPI)
CK, MB: 1.7 ng/mL (ref 0.3–4.0)
CK, MB: 2.1 ng/mL (ref 0.3–4.0)
Total CK: 159 U/L (ref 7–177)
Troponin I: 0.01 ng/mL (ref 0.00–0.06)

## 2011-03-22 LAB — TROPONIN I: Troponin I: 0.01 ng/mL (ref 0.00–0.06)

## 2011-03-22 LAB — URINE MICROSCOPIC-ADD ON

## 2011-03-22 LAB — URINALYSIS, ROUTINE W REFLEX MICROSCOPIC
Bilirubin Urine: NEGATIVE
Glucose, UA: NEGATIVE mg/dL
Ketones, ur: NEGATIVE mg/dL
Specific Gravity, Urine: 1.019 (ref 1.005–1.030)
pH: 7 (ref 5.0–8.0)

## 2011-03-22 LAB — POCT CARDIAC MARKERS: Troponin i, poc: <0.05 ng/mL (ref 0.00–0.09)

## 2011-03-22 LAB — DIFFERENTIAL
Basophils Absolute: 0 10*3/uL (ref 0.0–0.1)
Basophils Relative: 0 % (ref 0–1)
Eosinophils Relative: 2 % (ref 0–5)
Monocytes Absolute: 0.4 10*3/uL (ref 0.1–1.0)
Monocytes Relative: 6 % (ref 3–12)

## 2011-03-22 LAB — CBC
HCT: 40.3 % (ref 36.0–46.0)
MCV: 81.1 fL (ref 78.0–100.0)
Platelets: 208 10*3/uL (ref 150–400)
RBC: 4.97 MIL/uL (ref 3.87–5.11)
WBC: 6.9 10*3/uL (ref 4.0–10.5)

## 2011-04-02 IMAGING — NM NM TUMOR LOCAL/TRACER DISTR WITH SPECT
4 series · 24 of 24 positions shown · non-contrast
Comparison: None

CLINICAL DATA: Carcinoid tumor

NUCLEAR MEDICINE TUMOR SPECT
TECHNIQUE: Following intravenous administration of
radiopharmaceutical whole body planar images and SPECT images of
the region of interest were obtained on subsequent days.
Radiopharmaceutical: 5 mCi of indium 111 octreotide, IV.

[Series 1: it indium total body · 4.7mm · 4.71mm/px · 6 of 91 frames shown (1 of 4)]
[frame 8/91]
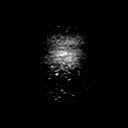
[frame 23/91]
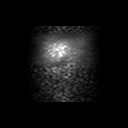
[frame 38/91]
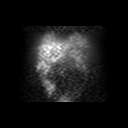
[frame 53/91]
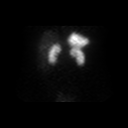
[frame 68/91]
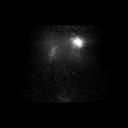
[frame 84/91]
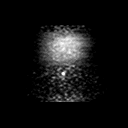

[Series 1: it indium total body · 4.7mm · 4.71mm/px · 6 of 91 frames shown (2 of 4)]
[frame 8/91]
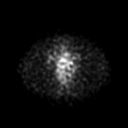
[frame 23/91]
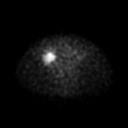
[frame 38/91]
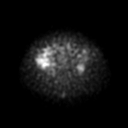
[frame 53/91]
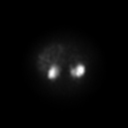
[frame 68/91]
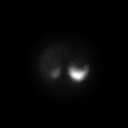
[frame 84/91]
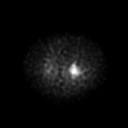

[Series 1: it indium total body · 4.71mm/px · 6 of 64 frames shown (3 of 4)]
[frame 6/64]
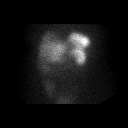
[frame 16/64]
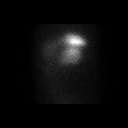
[frame 27/64]
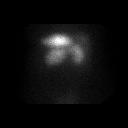
[frame 38/64]
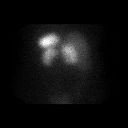
[frame 48/64]
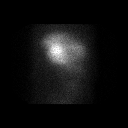
[frame 59/64]
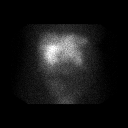

[Series 1: it indium total body · 4.7mm · 4.71mm/px · 6 of 91 frames shown (4 of 4)]
[frame 8/91]
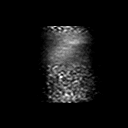
[frame 23/91]
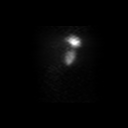
[frame 38/91]
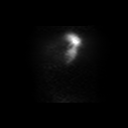
[frame 53/91]
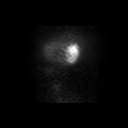
[frame 68/91]
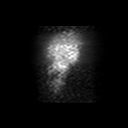
[frame 84/91]
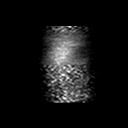

[24 of 24 positions shown; findings below may reference images not displayed]

FINDINGS: There is a normal physiologic distribution of the
radiopharmaceutical.

No abnormal foci of increased radiotracer uptake is identified to
suggest octreotide avid tumor or tumor metastasis.
IMPRESSION: 1.  Negative exam.

## 2011-05-02 NOTE — Assessment & Plan Note (Signed)
HEALTHCARE                         GASTROENTEROLOGY OFFICE NOTE   Mandy Webb, Mandy Webb                      MRN:          [id number]  DATE:01/24/2008                            DOB:          1946/04/22    PRIMARY CARE PHYSICIAN:  Dr. Donia Guiles at [facility].   GI PROBLEM LIST:  1. History of colon polyps/colonoscopy 2005, next is due 2010.  Family      history for colon cancer.  2. Questionable history of stomach polyps or Barrett's.  These were by      endoscopies in [place].  Repeat esophagogastroduodenoscopy by      Dr. Christella Hartigan January 2007 showed normal upper gastrointestinal      evaluation.  No Barrett's, no polyps.  3. Chronic constipation.  Well treated with a variety of agents,      including an herbal medicine that she takes, aloe vera, Miralax      p.r.n.   INTERVAL HISTORY:  I last saw Mandy Webb about a year ago.  She comes in  today complaining of a continued constipation, some new left lower  quadrant discomforts, and severe left low back and left leg pain.  The  back pain and the leg pain, she was evaluated at [facility] recently,  and it sounds like she has a sciatica.  She was given tramadol and told  to put heating pads on there.  She has had no change in her bowel habits  other than her general chronic constipation.  Currently she takes  MiraLax every other day.   CURRENT MEDICINES:  1. Super B complex.  2. Fish oil.  3. Multivitamin.  4. Albuterol.  5. Prilosec.  6. Baby aspirin.  7. Levothyroxine.  8. Mirapex.  9. Lipitor.  10.Hydrochlorothiazide.  11.Lexapro.  12.Ramipril.  13.Gabapentin.  14.Meloxicam.  15.Tylenol.  16.Tramadol.   PHYSICAL EXAMINATION:  Weight 175 pounds which is up 1 pound since last  visit.  Blood pressure 102/58, pulse 100.  CONSTITUTIONAL:  Fatigued and obese, otherwise well appearing.  ABDOMEN:  Soft, nontender, nondistended, normal bowel sounds.   ASSESSMENT AND PLAN:  A  65 year old woman with chronic constipation,  left back and leg pain, left lower quadrant pain.   Her left lower quadrant pains are actually chronic in nature.  This was  the focus of our meeting about a year ago.  At that point it seemed very  positional.  At this point she is not tender on examination, and I think  it may be related to her rather chronic constipation, so I have asked  her to change the way she is taking her MiraLax to daily instead of  every other day.  She will return to see me in 2 months' time.  I agree  that her back pain and her left leg pain are probably spinal related,  and I will leave that to her primary care physicians at [facility].  I  did not mention above, but she has had some minor dysphagia-type  symptoms and some indigestion.  I did do an esophagogastroduodenoscopy  on her about  year ago and it  was normal, so I think to reevaluate her  symptoms this time a barium upper gastrointestinal evaluation is  reasonable.     Rachael Fee, MD  Electronically Signed    DPJ/MedQ  DD: 01/24/2008  DT: 01/25/2008  Job #: [id number]   cc:   Donia Guiles, MD

## 2011-05-02 NOTE — H&P (Signed)
NAMECHANTAL, Webb             ACCOUNT NO.:  [id number]   MEDICAL RECORD NO.:  [id number]          PATIENT TYPE:  INP   LOCATION:  3107                         FACILITY:  [facility]   PHYSICIAN:  Hilda Lias, M.D.   DATE OF BIRTH:  1946-10-22   DATE OF ADMISSION:  05/01/2008  DATE OF DISCHARGE:                              HISTORY & PHYSICAL   HISTORY:  [name] is a lady who was seen by me initially around 18  months ago with history of recurrent pituitary tumor.  She had her first  surgery in [place] in 1997.  She came to the [facility], and she had  been followed here by Dr. Reche Dixon.  Right now, she complains of more  headache.  She has some medications, and she is feeling sleepy.  We did  an MRI which show that the tumor was 15 x 14 mm.  They were compared  with the previous one, there has been some increase.  We did continue on  the conservative treatment, and she came back complaining more headache.  We did a visual field, which showed some improvement with the visual  field, but she wanted to proceed with surgery because the pain was  getting worse.  Because of that, she has been on medical surgery today.   PAST MEDICAL HISTORY:  Resection of pituitary tumor in 1997.   SOCIAL HISTORY:  The patient does not smoke or drink.   FAMILY HISTORY:  Unremarkable.   REVIEW OF SYSTEMS:  Positive for high blood pressure, high cholesterol,  asthma, shortness of breath, and diabetes.   PHYSICAL EXAMINATION:  HEAD, EYES, EARS, NOSE, AND THROAT:  Normal.  NECK:  Normal.  LUNGS:  Clear.  HEART:  Heart sounds normal.  ABDOMEN:  Normal.  EXTREMITIES:  Normal pulses.  NEUROLOGICAL:  She is oriented x3.  Strength is normal.  Reflexes are  normal.   The MRI showed that she has a tumor on the pituitary gland, which is  mostly likely a recurrence.   CLINICAL IMPRESSION:  Recurrence of pituitary tumor.   RECOMMENDATION:  She was seen by the endocrinologist, ENT, and  ophthalmologist.  She is being admitted for the surgery.  The patient  knows the risk of the surgery about one being unable to remove the whole  tumor, second damage to the optic nerve, stroke __________ .  Possibility of paralysis and need of further surgery.           ______________________________  Hilda Lias, M.D.    EB/MEDQ  D:  05/01/2008  T:  05/02/2008  Job:  [id number]

## 2011-05-02 NOTE — Op Note (Signed)
Mandy Webb, Mandy Webb             ACCOUNT NO.:  [id number]   MEDICAL RECORD NO.:  [id number]          PATIENT TYPE:  INP   LOCATION:  3107                         FACILITY:  [facility]   PHYSICIAN:  Hilda Lias, M.D.   DATE OF BIRTH:  1946-05-21   DATE OF PROCEDURE:  DATE OF DISCHARGE:                               OPERATIVE REPORT   PREOPERATIVE DIAGNOSIS:  Recurrent pituitary tumor.   POSTOPERATIVE DIAGNOSIS:  Recurrent pituitary tumor.   PROCEDURE:  Transsphenoidal resection of the pituitary tumor with the C-  arm microscope.   ENT:  Kristine Garbe. Ezzard Standing, MD   NEUROSURGEON:  Hilda Lias, MD   CLINICAL HISTORY:  Mandy Webb is a 65 year old lady who in 1995  underwent surgery in [place] for pituitary tumor.  The patient had been  followed here in [place], and by x-ray we have noticed that the tumor  has increased in size.  Nevertheless, her visual field is grossly  normal.  We wanted to continue to be a little conservative with the  treatment, but the patient was really scared because the tumor was  getting bigger, and she would have to proceed with surgery before she  gets older.  The risk of course with this type of surgery she was fully  aware when she came to my office alone.  She was told that with this  type of surgery, some time because of the scar tissue it can be  difficult to resect the whole tumor, also bleeding, CSF leak, and the  possibility of removing the tumor, there might be traction at the base  of the brain which might produce some visual field as well as memory  changes.  Nevertheless, the patient was aware of the risk and she wanted  to proceed with the surgery.   PROCEDURE:  Mandy Webb was taken to the OR.  After intubation, 3 pins  were applied to the head.  She was positioned in semi-sitting manner.  Then, from the ENT Dr. Ezzard Standing took over and put [place] right at the level of  the base of the skull.  Of course, there was no sella, and the  patient  had quite a bit of fibrosis up to the point that it was difficult to  open the midline.  We had to work our way from the right side to the  left.  Any attempt to open the left side was quite difficult.  We  attempted with  Kerrison punch, but the scar tissue was so thick and  attached that it was difficult to do.  Nevertheless, at the end, we were  able to enter into the cavity.  The tumor which was in some places soft,  nevertheless, the majority of the tumor was quite a bit fibrotic.  Specimen was sent to the laboratory for permanent studies.  Using the  curette, we were able to grossly clean the whole area, although I am  sure that there was some tumor left behind which was attached mostly to  the dome of the capsule.  We did Valsalva maneuver at the end, and there  was no  evidence of any CSF leak.  Hemostasis was accomplished easily  using some packing.  Then, an incision was made in the right upper  quadrant through the skin, subcutaneous tissue, and a piece of fat was  removed.  The area of the abdomen was closed with Vicryl and Steri-  Strips.  Because we do not have any bone to  make the new floor, a piece of fat was put right at the entrance of the  opening of the tumor.  Then, Tisseel was used to keep the fat graft in  place.  From then on, Dr. Ezzard Standing took over to close the area.  After I  finished, I talked to the pastor and to the 2 daughters.           ______________________________  Hilda Lias, M.D.     EB/MEDQ  D:  05/01/2008  T:  05/02/2008  Job:  [id number]

## 2011-05-02 NOTE — Discharge Summary (Signed)
NAMETEMPESTT, SILBA             ACCOUNT NO.:  [id number]   MEDICAL RECORD NO.:  [id number]          PATIENT TYPE:  INP   LOCATION:  3023                         FACILITY:  [facility]   PHYSICIAN:  Hilda Lias, M.D.   DATE OF BIRTH:  August 07, 1946   DATE OF ADMISSION:  05/01/2008  DATE OF DISCHARGE:  05/05/2008                               DISCHARGE SUMMARY   ADMISSION DIAGNOSIS:  Recurrent pituitary tumor.   FINAL DIAGNOSIS:  Recurrent pituitary tumor.   CLINICAL HISTORY:  Ms. Latterell is a lady who in 1995 underwent  resection of the tumor in [place], [place].  The patient came back to live  here in [place], and she had been seen by her physician, and her  endocrinologist.  The patient had MRI repeated twice, which showed there  have been a slight increase of the pituitary tumor.  The visual field  were not affected by she had increase of headache.  The laboratory was  normal.   During the course of the hospital, the patient was taken to surgery and  a transsphenoidal resection of the tumor was done.  During surgery, we  found that the patient had quite a bit of fibrosis.  There was no  anatomy, and it was a quite difficult dissection.  After surgery, the  patient was kept in the intensive care unit and she was transferred to  the floor yesterday.  Today, she is doing much better.  She had some  tenderness in the scalp, but overall she is stable.  She had minimal  headache.  Visual field are normal.  She is ready to go home.   CONDITION ON DISCHARGE:  Improving.   MEDICATION:  Percocet for pain.  She is going to continue her  medications prior to surgery.   FOLLOWUP:  I will see him in my office in 3 weeks.  She has an  appointment to see Dr. Ezzard Standing from ENT next week and Dr. Talmage Nap, who is  the endocrinologist in the first week of June.  I would be calling him  for an appointment.   DIET:  Regular.   ACTIVITY:  Not to drive.  She is doing water aerobics, and she was  advised not to have water aerobics.  Incidentally, since after the  surgery, we found quite a bit of scar tissue at the level of the tumor  mass.  She gave me the information, then after surgery, she had  radiation therapy.  We were not aware of this ``information, and the  patient said she completely forgot about it.           ______________________________  Hilda Lias, M.D.     EB/MEDQ  D:  05/05/2008  T:  05/06/2008  Job:  [id number]

## 2011-05-02 NOTE — Op Note (Signed)
[name], [name]             ACCOUNT NO.:  [id number]   MEDICAL RECORD NO.:  [id number]          PATIENT TYPE:  INP   LOCATION:  3107                         FACILITY:  [facility]   PHYSICIAN:  Mandy Garbe. Ezzard Webb, M.D.DATE OF BIRTH:  December 25, 1945   DATE OF PROCEDURE:  05/01/2008  DATE OF DISCHARGE:                               OPERATIVE REPORT   PREOPERATIVE DIAGNOSIS:  Pituitary tumor.   POSTOPERATIVE DIAGNOSIS:  Pituitary tumor.   OPERATION:  Transseptal/transsphenoidal resection of pituitary tumor.   SURGEON:  Mandy Garbe. Ezzard Standing, MD.   CO-SURGEONHilda Lias, MD   ANESTHESIA:  General endotracheal.   COMPLICATIONS:  None.   BRIEF CLINICAL NOTE:  Mandy Webb is a 65 year old female who has  had previous history of pituitary tumor, initially removed in [place] in  1997.  She has been followed with subsequent MRI scans, and the tumor  has been increasing in size and Dr. Jeral Fruit took her back to the  operating room at this time for re-excision of the pituitary tumor.  Of  note, on exam in the office, the patient has a large septal perforation  from her previous surgery in 1997.  There is still residual  approximately 1 cm of cartilage of the septum remaining anteriorly, but  otherwise there is a large perforation that extends currently  approximately 1 cm to 1.5 cm from the caudal edge of the septum back  almost to the base of the sphenoid sinus.  I discussed this with the  patient prior to surgery.  She is taken to the operating room at this  time for transsphenoidal/transseptal resection of pituitary tumor.   DESCRIPTION OF PROCEDURE:  The patient was positioned by Dr. Jeral Fruit in  the neuro OR.  The nose space was prepped with Betadine solution and  dried using sterile towels.  The nose was then further prepped with  cotton pledgets soaked in Afrin, and the septum and floor of the nose  was injected with Xylocaine with epinephrine for hemostasis.  An  extended  hemitransfixion incision was made along the caudal edge of the  septum on the right side extending along to the floor of the nose.  Elevation of the mucoperiosteum off the floor of the nose and upon the  maxillary crest on the septum was then performed.  This was elevated  posteriorly to we reached the perforation, which was then entered.  She  had about 1 cm of cartilaginous septum anteriorly.  The cartilage of the  septum that was remained was then dissected off the maxillary crest and  pushed over onto the left nasal airway.  Mucoperiosteal and  mucoperichondrial flaps were then elevated posteriorly.  More  posteriorly and superiorly, there was a little residual cartilage that  was harvested for closure by Dr. Jeral Fruit to be used later.  Dissection  was carried down back to the base of the sphenoid sinus where there was  a scar tissue and missing bone and then sphenoid sinus was then entered.  A Hardy retractor was positioned.  After adequate exposure of the  sphenoid sinus, Dr. Jeral Fruit then proceeded to remove  the pituitary tumor  and closed the defect with harvested fat graft and Tisseel.  I was then  called back in to close the septal approach.  The cartilage was placed  back on the maxillary crest.  Hemitransfixion incision was closed with  interrupted 4-0 chromic sutures.  The remaining anterior cartilaginous  septum was then basted with a 3-0 chromic suture.  Large Silastic  splints were placed on either side of the septum and on either side of  the septal  perforation and secured to the septum with a 3-0 nylon suture.  The nose  was then packed with Telfa soaked and bacitracin ointment.  This  completed the procedure.  The patient was awakened from anesthesia and  transferred to the recovery room, postop doing well.  We will plan on  removing the nasal packing in 3-4 days.           ______________________________  Mandy Webb, M.D.     CEN/MEDQ  D:  05/01/2008  T:   05/02/2008  Job:  [id number]   cc:   Mandy Webb, M.D.

## 2011-05-02 NOTE — Assessment & Plan Note (Signed)
[facility]                         GASTROENTEROLOGY OFFICE NOTE   LADA, FULBRIGHT                      MRN:          [id number]  DATE:03/20/2008                            DOB:          02-Aug-1946    GI FOLLOWUP NOTE:   PRIMARY CARE PHYSICIAN:  Dineen Kid. Reche Dixon, M.D.   GI PROBLEM LIST:  1. History of colon polyps/colonoscopy 2005, next is due 2010.  Family      history for colon cancer.  2. Questionable history of stomach polyps or Barrett's.  These were by      endoscopies in [place].  Repeat esophagogastroduodenoscopy by      Dr. Christella Hartigan January 2007 showed normal upper gastrointestinal      evaluation.  No Barrett's, no polyps.  3. Chronic constipation.  Well treated with a variety of agents,      including an herbal medicine that she takes, aloe vera, Miralax      p.r.n.   INTERVAL HISTORY:  I last saw Quintella two months ago.  Since then, she has  been taking one scoop of MiraLax on pretty much a daily basis and has  noticed no change in her constipation.  She has had no bleeding.  She  says she moves her bowels once to a maximum of three times a week.  She  does have to strain to move her bowels.  She continued to have left  upper quadrant discomfort.  She says she feels full all the time.  She  had no nausea or vomiting.   CURRENT MEDICINES:  Super B complex, fish oil, multivitamin, albuterol,  Prilosec OTC, baby aspirin, levothyroxine, Mirapex, Lipitor,  hydrochlorothiazide, Lexapro, Ramipril, gabapentin, Meloxicam, Tylenol  and Tramadol.   PHYSICAL EXAM:  Weight 177 pounds, which is up 2 pounds since her last  visit.  Blood pressure 110/62, pulse 90.  CONSTITUTIONAL:  Generally well-appearing.  NEUROLOGIC:  Alert and oriented times three.  ABDOMEN:  Soft, nontender, nondistended, normal bowel sounds.   ASSESSMENT AND PLAN:  Patient is a 65 year old woman with chronic  constipation, left upper quadrant pain.   Her left  upper quadrant pain is of unclear etiology and she has not yet  had a CT scan that I have seen and so I will arrange for her to have a  CT scan, abdomen and pelvis, with IV and oral contrast.  She is pretty  clear that some of her discomforts improve when she moves her bowels, so  I suspect these are functional.  She is, however, still fairly  constipated on one scoop of MiraLax on a daily basis, so I have  instructed her to double her MiraLax dosage.  We will get the CAT scan,  she will double her MiraLax, and if, when she returns to see me in three  months' time, she has not noticed a significant improvement in either  her constipation or the abdominal  pains, then I will likely repeat colonoscopy, given her family history  of colon cancer and her personal history of colon polyps.     Rachael Fee, MD  Electronically  Signed    DPJ/MedQ  DD: 03/20/2008  DT: 03/20/2008  Job #: [id number]   cc:   Dineen Kid. Reche Dixon, M.D.

## 2011-05-05 NOTE — Assessment & Plan Note (Signed)
[facility]                         GASTROENTEROLOGY OFFICE NOTE   AVILA, ALBRITTON                      MRN:          [id number]  DATE:04/08/2007                            DOB:          1946/09/26    PRIMARY CARE PHYSICIAN:  Dr. Reche Dixon at [facility]   GI PROBLEM LIST:  1. History of colon polyps/colonoscopy 2005, next is due 2010.  Family      history for colon cancer.  2. Questionable history of stomach polyps or Barrett's.  These were by      endoscopies in [place].  Repeat esophagogastroduodenoscopy by      Dr. Christella Hartigan January 2007 showed normal upper gastrointestinal      evaluation.  No Barrett's, no polyps.  3. Chronic constipation.  Well treated with a variety of agents,      including an herbal medicine that she takes, aloe vera, MiraLax      p.r.n.   INTERVAL HISTORY:  I last saw Mandy Webb a year and a half ago.  Since then,  she still has her constipation.  She takes a variety of different stool  agents to help with that and on these agents she moves her bowels once a  day or once every other day.  She is most bothered by chronic  intermittent left lower quadrant discomfort.  She says this happens the  most when she changes positions with her left arm or twists in bed.  She  has no nausea, vomiting.  She has been trying to lose weight and her  weight is down 4 pounds in a year and-a-half.   CURRENT MEDICINES:  Super B complex, fish oil, glucosamine,  multivitamin, ginkgo, Advair, albuterol, Prilosec OTC, baby aspirin,  levothyroxine, Mirapex, Lipitor, hydrochlorothiazide, Lexapro, ramipril,  gabapentin.   PHYSICAL EXAMINATION:  Weight 174 pounds.  Calculated BMI 34.  Blood  pressure 118/68, pulse 114.  CONSTITUTIONAL:  Obese, otherwise well appearing.  NEUROLOGICAL:  Alert and oriented x3.  ABDOMEN:  Soft, nontender, normal bowel sounds.   ASSESSMENT AND PLAN:  A 65 year old woman with chronic constipation,  history of  colon polyps and family history of colon cancer, intermittent  left upper quadrant positional abdominal discomfort.   Her left lower quadrant discomforts tend to only happen when she is  shifting her positions or moving in bed or twisting.  This most likely  is a musculoskeletal discomfort.  Eating does not bring on the  discomforts.  She has had an EGD less than a year and-a-half ago and  that was normal, so I do not think that needs to be repeated now.  I  think it is a good chance that these discomforts will improve if she  loses weight.  I stressed a lower calorie well-balanced diet.  She  will return to see me in 2 months' time and sooner if needed.  I see no  reason for any further  blood testing or imaging studies at this time, as these do seem to be  most likely musculoskeletal discomforts.     Mandy Fee, MD  Electronically Signed  DPJ/MedQ  DD: 04/08/2007  DT: 04/08/2007  Job #: [id number]

## 2011-05-05 NOTE — Op Note (Signed)
Mandy Webb, BARTHA             ACCOUNT NO.:  [id number]   MEDICAL RECORD NO.:  [id number]          PATIENT TYPE:  AMB   LOCATION:  DSC                          FACILITY:  [facility]   PHYSICIAN:  Cindee Salt, M.D.       DATE OF BIRTH:  [birth year]   DATE OF PROCEDURE:  01/30/2007  DATE OF DISCHARGE:                               OPERATIVE REPORT   PREOPERATIVE DIAGNOSIS:  Stenosing tenosynovitis, left thumb.   POSTOPERATIVE DIAGNOSIS:  Stenosing tenosynovitis, left thumb.   OPERATION:  Release A1 pulley, left thumb.   SURGEON:  Cindee Salt, M.D.   ASSISTANT:  Carolyne Fiscal R.N.   ANESTHESIA:  Forearm based IV regional.   HISTORY:  The patient is a 65 year old female with a history of  triggering of her left thumb not responsive to conservative treatment.  She is desirous of release.  She is well aware of risks and  complications including infection, recurrence, injury to arteries,  nerves, tendons, incomplete relief of symptoms, dystrophy.  She has  elected to proceed to have this done, questions in the preoperative area  are encouraged and answered.  The extremity marked by both the patient  and surgeon.   PROCEDURE:  The patient is brought to the operating room where a forearm  based IV regional anesthetic was carried out without difficulty.  She  was prepped using DuraPrep, supine position, left arm free.  A  transverse incision was made over the metacarpophalangeal joint crease  of the left thumb, carried down through subcutaneous tissue.  Bleeders  were electrocauterized.  The radial and ulnar digital neurovascular  bundles identified and protected.  Retractors placed.  An incision was  then made on the radial aspect of the A1 pulley.  A cyst was not  identified.  There was an area of fatty tissue which appeared to be  thick and the release was performed protecting the oblique pulley of the  A1 pulley.  This allowed complete flexion-extension of the thumb without  any further  triggering.  The wound was irrigated.  Skin closed  interrupted 5-0 nylon sutures.  Sterile compressive dressing was  applied.  The patient tolerated the procedure well and was taken to the  recovery room for observation in satisfactory condition.  She is  discharged home to return to [facility] [place] in one week on  Vicodin.           ______________________________  Cindee Salt, M.D.     GK/MEDQ  D:  01/30/2007  T:  01/30/2007  Job:  [id number]

## 2011-07-20 ENCOUNTER — Other Ambulatory Visit: Payer: Self-pay | Admitting: Family Medicine

## 2011-07-20 DIAGNOSIS — R9389 Abnormal findings on diagnostic imaging of other specified body structures: Secondary | ICD-10-CM

## 2011-07-21 ENCOUNTER — Other Ambulatory Visit: Payer: Self-pay | Admitting: Neurosurgery

## 2011-07-21 ENCOUNTER — Ambulatory Visit
Admission: RE | Admit: 2011-07-21 | Discharge: 2011-07-21 | Disposition: A | Discharge status: Home / Self Care | Payer: Medicare Other | Source: Ambulatory Visit | Attending: Neurosurgery | Admitting: Neurosurgery

## 2011-07-21 ENCOUNTER — Ambulatory Visit
Admission: RE | Admit: 2011-07-21 | Discharge: 2011-07-21 | Disposition: A | Discharge status: Home / Self Care | Payer: Medicare Other | Source: Ambulatory Visit | Attending: Family Medicine | Admitting: Family Medicine

## 2011-07-21 DIAGNOSIS — R51 Headache: Secondary | ICD-10-CM

## 2011-07-21 DIAGNOSIS — R9389 Abnormal findings on diagnostic imaging of other specified body structures: Secondary | ICD-10-CM

## 2011-07-21 MED ORDER — GADOBENATE DIMEGLUMINE 529 MG/ML IV SOLN
8.0000 mL | Freq: Once | INTRAVENOUS | Status: AC | PRN
  Administered 2011-07-21: 8 mL via INTRAVENOUS

## 2011-07-21 MED ORDER — IOHEXOL 300 MG/ML  SOLN
75.0000 mL | Freq: Once | INTRAMUSCULAR | Status: AC | PRN
  Administered 2011-07-21: 75 mL via INTRAVENOUS

## 2011-07-27 ENCOUNTER — Other Ambulatory Visit: Payer: Medicare Other

## 2011-08-02 ENCOUNTER — Other Ambulatory Visit: Payer: Self-pay | Admitting: Neurology

## 2011-08-02 DIAGNOSIS — R269 Unspecified abnormalities of gait and mobility: Secondary | ICD-10-CM

## 2011-08-08 ENCOUNTER — Other Ambulatory Visit: Payer: Medicare Other

## 2011-08-19 ENCOUNTER — Ambulatory Visit
Admission: RE | Admit: 2011-08-19 | Discharge: 2011-08-19 | Disposition: A | Discharge status: Home / Self Care | Payer: Medicare Other | Source: Ambulatory Visit | Attending: Neurology | Admitting: Neurology

## 2011-08-19 DIAGNOSIS — R269 Unspecified abnormalities of gait and mobility: Secondary | ICD-10-CM

## 2011-09-13 LAB — BASIC METABOLIC PANEL
BUN: 10
BUN: 11
CO2: 30
CO2: 33 — ABNORMAL HIGH
CO2: 34 — ABNORMAL HIGH
Calcium: 8.5
Calcium: 8.7
Chloride: 102
Chloride: 103
Chloride: 98
Creatinine, Ser: 0.77
Creatinine, Ser: 0.78
GFR calc Af Amer: >60
GFR calc Af Amer: >60
GFR calc Af Amer: >60
GFR calc non Af Amer: >60
GFR calc non Af Amer: >60
Glucose, Bld: 163 — ABNORMAL HIGH
Glucose, Bld: 180 — ABNORMAL HIGH
Glucose, Bld: 191 — ABNORMAL HIGH
Potassium: 4
Potassium: 4.2
Potassium: 4.5
Sodium: 135
Sodium: 139
Sodium: 141

## 2011-09-13 LAB — COMPREHENSIVE METABOLIC PANEL
AST: 22
CO2: 31
Calcium: 8.6
Creatinine, Ser: 0.78
GFR calc Af Amer: >60
GFR calc non Af Amer: >60
Total Protein: 6.7

## 2011-09-13 LAB — URINALYSIS, DIPSTICK ONLY
Bilirubin Urine: NEGATIVE
Glucose, UA: NEGATIVE
Glucose, UA: NEGATIVE
Hgb urine dipstick: NEGATIVE
Hgb urine dipstick: NEGATIVE
Ketones, ur: NEGATIVE
Leukocytes, UA: NEGATIVE
Nitrite: NEGATIVE
Protein, ur: NEGATIVE
Specific Gravity, Urine: 1.006
Specific Gravity, Urine: 1.007
Urobilinogen, UA: 0.2
pH: 6.5

## 2011-09-13 LAB — CBC
HCT: 38.4
Hemoglobin: 12.7
MCHC: 33
MCHC: 33.4
MCV: 81.2
MCV: 81.9
Platelets: 213
RBC: 4.32
RBC: 4.73
RDW: 13.5
RDW: 13.6

## 2011-09-13 LAB — HEMOGLOBIN A1C
Hgb A1c MFr Bld: 6.3 — ABNORMAL HIGH
Mean Plasma Glucose: 147

## 2011-10-23 ENCOUNTER — Other Ambulatory Visit: Payer: Self-pay | Admitting: Family Medicine

## 2011-10-23 DIAGNOSIS — M545 Low back pain: Secondary | ICD-10-CM

## 2011-10-24 ENCOUNTER — Ambulatory Visit
Admission: RE | Admit: 2011-10-24 | Discharge: 2011-10-24 | Disposition: A | Discharge status: Home / Self Care | Payer: Medicare Other | Source: Ambulatory Visit | Attending: Family Medicine | Admitting: Family Medicine

## 2011-10-24 DIAGNOSIS — M545 Low back pain, unspecified: Secondary | ICD-10-CM

## 2011-11-27 ENCOUNTER — Ambulatory Visit: Payer: PRIVATE HEALTH INSURANCE | Attending: Physical Medicine and Rehabilitation

## 2011-11-27 DIAGNOSIS — M2560 Stiffness of unspecified joint, not elsewhere classified: Secondary | ICD-10-CM | POA: Insufficient documentation

## 2011-11-27 DIAGNOSIS — R5381 Other malaise: Secondary | ICD-10-CM | POA: Insufficient documentation

## 2011-11-27 DIAGNOSIS — M2550 Pain in unspecified joint: Secondary | ICD-10-CM | POA: Insufficient documentation

## 2011-11-27 DIAGNOSIS — IMO0001 Reserved for inherently not codable concepts without codable children: Secondary | ICD-10-CM | POA: Insufficient documentation

## 2011-11-27 DIAGNOSIS — R262 Difficulty in walking, not elsewhere classified: Secondary | ICD-10-CM | POA: Insufficient documentation

## 2011-12-04 ENCOUNTER — Ambulatory Visit: Payer: PRIVATE HEALTH INSURANCE

## 2011-12-07 ENCOUNTER — Ambulatory Visit: Payer: PRIVATE HEALTH INSURANCE

## 2011-12-14 ENCOUNTER — Ambulatory Visit: Payer: PRIVATE HEALTH INSURANCE

## 2011-12-25 ENCOUNTER — Ambulatory Visit: Payer: Medicare Other | Attending: Physical Medicine and Rehabilitation

## 2011-12-25 DIAGNOSIS — IMO0001 Reserved for inherently not codable concepts without codable children: Secondary | ICD-10-CM | POA: Insufficient documentation

## 2011-12-25 DIAGNOSIS — M2560 Stiffness of unspecified joint, not elsewhere classified: Secondary | ICD-10-CM | POA: Insufficient documentation

## 2011-12-25 DIAGNOSIS — R5381 Other malaise: Secondary | ICD-10-CM | POA: Insufficient documentation

## 2011-12-25 DIAGNOSIS — R262 Difficulty in walking, not elsewhere classified: Secondary | ICD-10-CM | POA: Insufficient documentation

## 2011-12-25 DIAGNOSIS — M2550 Pain in unspecified joint: Secondary | ICD-10-CM | POA: Insufficient documentation

## 2012-02-01 ENCOUNTER — Other Ambulatory Visit: Payer: Self-pay | Admitting: Family Medicine

## 2012-02-02 ENCOUNTER — Telehealth: Payer: Self-pay

## 2012-02-02 NOTE — Telephone Encounter (Signed)
PT IS REQUESTING MED REFILLS ON TWO OF HER SCRIPTS - SHE COULD NOT FIND HER BOTTLES - NOT SURE WHICH ONES  PLEASE CALL [phone] [facility]

## 2012-02-03 ENCOUNTER — Ambulatory Visit (INDEPENDENT_AMBULATORY_CARE_PROVIDER_SITE_OTHER): Payer: PRIVATE HEALTH INSURANCE | Admitting: Internal Medicine

## 2012-02-03 ENCOUNTER — Encounter: Payer: Self-pay | Admitting: Internal Medicine

## 2012-02-03 VITALS — BP 183/91 | HR 69 | Temp 98.0°F | Resp 18 | Ht 61.0 in | Wt 175.0 lb

## 2012-02-03 DIAGNOSIS — R269 Unspecified abnormalities of gait and mobility: Secondary | ICD-10-CM

## 2012-02-03 DIAGNOSIS — M545 Low back pain: Secondary | ICD-10-CM

## 2012-02-03 DIAGNOSIS — E119 Type 2 diabetes mellitus without complications: Secondary | ICD-10-CM

## 2012-02-03 DIAGNOSIS — Z0000 Encounter for general adult medical examination without abnormal findings: Secondary | ICD-10-CM

## 2012-02-03 DIAGNOSIS — Z789 Other specified health status: Secondary | ICD-10-CM

## 2012-02-03 DIAGNOSIS — I10 Essential (primary) hypertension: Secondary | ICD-10-CM

## 2012-02-03 DIAGNOSIS — Z131 Encounter for screening for diabetes mellitus: Secondary | ICD-10-CM

## 2012-02-03 LAB — POCT GLYCOSYLATED HEMOGLOBIN (HGB A1C): Hemoglobin A1C: 5.9

## 2012-02-03 LAB — CBC WITH DIFFERENTIAL/PLATELET
Basophils Absolute: 0 10*3/uL (ref 0.0–0.1)
HCT: 42.1 % (ref 36.0–46.0)
Hemoglobin: 14.2 g/dL (ref 12.0–15.0)
Lymphocytes Relative: 29 % (ref 12–46)
Monocytes Absolute: 0.4 10*3/uL (ref 0.1–1.0)
Monocytes Relative: 7 % (ref 3–12)
Neutro Abs: 3.5 10*3/uL (ref 1.7–7.7)
RDW: 13.6 % (ref 11.5–15.5)
WBC: 5.5 10*3/uL (ref 4.0–10.5)

## 2012-02-03 LAB — COMPREHENSIVE METABOLIC PANEL
AST: 20 U/L (ref 0–37)
Albumin: 4.5 g/dL (ref 3.5–5.2)
Alkaline Phosphatase: 57 U/L (ref 39–117)
BUN: 9 mg/dL (ref 6–23)
Calcium: 9.5 mg/dL (ref 8.4–10.5)
Chloride: 100 mEq/L (ref 96–112)
Potassium: 4.1 mEq/L (ref 3.5–5.3)
Total Bilirubin: 0.6 mg/dL (ref 0.3–1.2)
Total Protein: 8.1 g/dL (ref 6.0–8.3)

## 2012-02-03 NOTE — Telephone Encounter (Signed)
Pt here in our office today and received RXS.

## 2012-02-03 NOTE — Progress Notes (Signed)
  Subjective:    Patient ID: Mandy Webb, female    DOB: July 30, 1946, 66 y.o.   MRN: [id number]  HPI Requests form filled out for scat travel. Agrees to schedule CPE with Mandy Webb 89. DM HTN Disabled due to arthritis Asthma allergy  Review of Systems stable    Objective:   Physical Exam  Constitutional: She is oriented to person, place, and time. She appears well-nourished. No distress.  Eyes: EOM are normal. Pupils are equal, round, and reactive to light.  Neck: Neck supple.  Cardiovascular: Normal rate.   Pulmonary/Chest: Effort normal.  Musculoskeletal:       Uses walker due to severe arthritis and spine disease.  Neurological: She is alert and oriented to person, place, and time. She has normal strength. Coordination abnormal.        Has leg neuropathy and chronic pain, disability          Assessment & Plan:   Form filled out for scat.  A!C and glucose ordered.  Sched. CPE soon.

## 2012-02-29 ENCOUNTER — Other Ambulatory Visit: Payer: Self-pay | Admitting: Physician Assistant

## 2012-03-01 ENCOUNTER — Other Ambulatory Visit: Payer: Self-pay | Admitting: Family Medicine

## 2012-03-01 ENCOUNTER — Other Ambulatory Visit: Payer: Self-pay | Admitting: Physician Assistant

## 2012-03-01 NOTE — Telephone Encounter (Signed)
Pt would like to request to get a 3 month supply of all of her medications please call at [phone]

## 2012-03-03 NOTE — Telephone Encounter (Signed)
[facility] notifying patient that she needs office visit ASAP.  15 day supply only given.  Please RTC.

## 2012-03-04 ENCOUNTER — Telehealth: Payer: Self-pay

## 2012-03-04 NOTE — Telephone Encounter (Signed)
Yes - you can refill all these meds for 1 90d supply.

## 2012-03-04 NOTE — Telephone Encounter (Signed)
Pt called to request RFs on all of the following medications FOR 90 DAYS since she travels to stay with son out of state and that way she doesn't run out while she is gone. She was seen on 02/03/12 by Dr Perrin Maltese, and has CPE scheduled with Dr Audria Nine on 04/03/12. We have filled several of these last week, but with lesser amounts stating she needs OV? Can we get her 90 day RFs to [facility] [address] [address] for [employer] testing strips and lancets, reglan, alphagan eye drops, lipitor, glipizide, advair, levothyroxine, gabapentin, and singulair?

## 2012-03-05 NOTE — Telephone Encounter (Signed)
Called in all Rxs to [facility] [address] per pt request - she decided to change pharmacies. Pt notified being done.

## 2012-03-13 ENCOUNTER — Other Ambulatory Visit: Payer: Self-pay | Admitting: *Deleted

## 2012-03-13 ENCOUNTER — Telehealth: Payer: Self-pay

## 2012-03-13 MED ORDER — GLUCOSE BLOOD VI STRP: ORAL_STRIP | Status: DC

## 2012-03-13 NOTE — Telephone Encounter (Signed)
Pt had LM on my VM that there is some problem with getting her test strips and [facility] was going to fax what was needed. Called pharmacist and asked what was needed. He explained that Medicare requires Rx for test strips be sent by fax or e- Rxd with Dx code on order. Faxing in written copy to [facility] [address] with Dx code.

## 2012-04-03 ENCOUNTER — Ambulatory Visit (INDEPENDENT_AMBULATORY_CARE_PROVIDER_SITE_OTHER): Payer: Medicare Other | Admitting: Family Medicine

## 2012-04-03 VITALS — BP 116/75 | HR 81 | Temp 98.5°F | Resp 16 | Ht 61.0 in | Wt 171.2 lb

## 2012-04-03 DIAGNOSIS — M1990 Unspecified osteoarthritis, unspecified site: Secondary | ICD-10-CM

## 2012-04-03 DIAGNOSIS — Z79899 Other long term (current) drug therapy: Secondary | ICD-10-CM

## 2012-04-03 DIAGNOSIS — Z23 Encounter for immunization: Secondary | ICD-10-CM

## 2012-04-03 DIAGNOSIS — Z0000 Encounter for general adult medical examination without abnormal findings: Secondary | ICD-10-CM

## 2012-04-03 DIAGNOSIS — E119 Type 2 diabetes mellitus without complications: Secondary | ICD-10-CM

## 2012-04-03 NOTE — Patient Instructions (Signed)
Your last A1c in Feb 2013= 5.9%    Keeping You Healthy  Get These Tests  Blood Pressure- Have your blood pressure checked by your healthcare provider at least once a year.  Normal blood pressure is 120/80.  Weight- Have your body mass index (BMI) calculated to screen for obesity.  BMI is a measure of body fat based on height and weight.  You can calculate your own BMI at https://www.west-esparza.com/  Cholesterol- Have your cholesterol checked every year.  Diabetes- Have your blood sugar checked every year if you have high blood pressure, high cholesterol, a family history of diabetes or if you are overweight.  Pap Smear- Have a pap smear every 1 to 3 years if you have been sexually active.  If you are older than 65 and recent pap smears have been normal you may not need additional pap smears.  In addition, if you have had a hysterectomy  For benign disease additional pap smears are not necessary.  Mammogram-Yearly mammograms are essential for early detection of breast cancer  Screening for Colon Cancer- Colonoscopy starting at age 66. Screening may begin sooner depending on your family history and other health conditions.  Follow up colonoscopy as directed by your Gastroenterologist.  Screening for Osteoporosis- Screening begins at age 61 with bone density scanning, sooner if you are at higher risk for developing Osteoporosis.  Get these medicines  Calcium with Vitamin D- Your body requires 1200-1500 mg of Calcium a day and [phone] IU of Vitamin D a day.  You can only absorb 500 mg of Calcium at a time therefore Calcium must be taken in 2 or 3 separate doses throughout the day.  Hormones- Hormone therapy has been associated with increased risk for certain cancers and heart disease.  Talk to your healthcare provider about if you need relief from menopausal symptoms.  Aspirin- Ask your healthcare provider about taking Aspirin to prevent Heart Disease and Stroke.  Get these  Immuniztions  Flu shot- Every fall  Pneumonia shot- Once after the age of 8; if you are younger ask your healthcare provider if you need a pneumonia shot.  Tetanus- Every ten years.  Zostavax- Once after the age of 93 to prevent shingles.  Take these steps  Don't smoke- Your healthcare provider can help you quit. For tips on how to quit, ask your healthcare provider or go to www.smokefree.gov or call 1-800 QUIT-NOW.  Be physically active- Exercise 5 days a week for a minimum of 30 minutes.  If you are not already physically active, start slow and gradually work up to 30 minutes of moderate physical activity.  Try walking, dancing, bike riding, swimming, etc.  Eat a healthy diet- Eat a variety of healthy foods such as fruits, vegetables, whole grains, low fat milk, low fat cheeses, yogurt, lean meats, chicken, fish, eggs, dried beans, tofu, etc.  For more information go to www.thenutritionsource.org  Dental visit- Brush and floss teeth twice daily; visit your dentist twice a year.  Eye exam- Visit your Optometrist or Ophthalmologist yearly.  Drink alcohol in moderation- Limit alcohol intake to one drink or less a day.  Never drink and drive.  Depression- Your emotional health is as important as your physical health.  If you're feeling down or losing interest in things you normally enjoy, please talk to your healthcare provider.  Seat Belts- can save your life; always wear one  Smoke/Carbon Monoxide detectors- These detectors need to be installed on the appropriate level of your home.  Replace  batteries at least once a year.  Violence- If anyone is threatening or hurting you, please tell your healthcare provider. Living Will/ Health care power of attorney- Discuss with your healthcare provider and family.    Constipation in Adults Constipation is having fewer than 2 bowel movements per week. Usually, the stools are hard. As we grow older, constipation is more common. If you try to  fix constipation with laxatives, the problem may get worse. This is because laxatives taken over a long period of time make the colon muscles weaker. A low-fiber diet, not taking in enough fluids, and taking some medicines may make these problems worse. MEDICATIONS THAT MAY CAUSE CONSTIPATION  Water pills (diuretics).   Calcium channel blockers (used to control blood pressure and for the heart).   Certain pain medicines (narcotics).   Anticholinergics.   Anti-inflammatory agents.   Antacids that contain aluminum.  DISEASES THAT CONTRIBUTE TO CONSTIPATION  Diabetes.   Parkinson's disease.   Dementia.   Stroke.   Depression.   Illnesses that cause problems with salt and water metabolism.  HOME CARE INSTRUCTIONS   Constipation is usually best cared for without medicines. Increasing dietary fiber and eating more fruits and vegetables is the best way to manage constipation.   Slowly increase fiber intake to 25 to 38 grams per day. Whole grains, fruits, vegetables, and legumes are good sources of fiber. A dietitian can further help you incorporate high-fiber foods into your diet.   Drink enough water and fluids to keep your urine clear or pale yellow.   A fiber supplement may be added to your diet if you cannot get enough fiber from foods.   Increasing your activities also helps improve regularity.   Suppositories, as suggested by your caregiver, will also help. If you are using antacids, such as aluminum or calcium containing products, it will be helpful to switch to products containing magnesium if your caregiver says it is okay.   If you have been given a liquid injection (enema) today, this is only a temporary measure. It should not be relied on for treatment of longstanding (chronic) constipation.   Stronger measures, such as magnesium sulfate, should be avoided if possible. This may cause uncontrollable diarrhea. Using magnesium sulfate may not allow you time to make it to  the bathroom.  SEEK IMMEDIATE MEDICAL CARE IF:   There is bright red blood in the stool.   The constipation stays for more than 4 days.   There is belly (abdominal) or rectal pain.   You do not seem to be getting better.   You have any questions or concerns.  MAKE SURE YOU:   Understand these instructions.   Will watch your condition.   Will get help right away if you are not doing well or get worse.  Document Released: 09/01/2004 Document Revised: 11/23/2011 Document Reviewed: 11/07/2011 [facility] Patient Information 2012 [place], [place].

## 2012-04-07 ENCOUNTER — Encounter: Payer: Self-pay | Admitting: Family Medicine

## 2012-04-07 NOTE — Progress Notes (Signed)
Subjective:    Patient ID: Mandy Webb, female    DOB: Mar 03, 1946, 66 y.o.   MRN: [id number]  HPI This 66 y.o. Female is here for [facility] Annual Wellness Visit. She has Asthma, Diabetes, GERD,  HTN, Lipid Disorder (probable Metabolic Syndrome),Neuropathy and Obesity.She is compliant with medications   and denies side effects. She also has chronic joint pains/ DJD and had evaluation by Ortho(xrays -); she was  treated conservatively with PT and exercise. Pt checks FSBS daily and avg readings= 85-100. She denies hypoglycemia.    HCM-: MMG-  2 years ago (normal); pt declines the be scheduled             Colonoscopy- 5 years ago (normal)             Eye exam- 03/26/12    Dental- 03/20/11   Review of Systems  Constitutional: Negative.   HENT: Positive for neck pain and tinnitus.   Eyes: Negative.   Respiratory: Positive for shortness of breath and wheezing. Negative for cough and chest tightness.   Gastrointestinal: Positive for constipation. Negative for abdominal pain, blood in stool and rectal pain.       Hepatitis B per pt hx; heartburn and indigestion and BMs every other day without strain  Genitourinary: Positive for frequency. Negative for dysuria, hematuria, vaginal bleeding, difficulty urinating, vaginal pain and pelvic pain.       Menopause at age 2  Musculoskeletal: Positive for arthralgias.  Skin: Negative.   Neurological: Positive for numbness and headaches. Negative for syncope, facial asymmetry, speech difficulty and weakness.       Daily HAs relieved by Advil; Numbness in feet- chronic  Hematological: Negative.   Psychiatric/Behavioral: Negative.        Objective:   Physical Exam  Nursing note and vitals reviewed. Constitutional: She is oriented to person, place, and time. She appears well-developed and well-nourished. No distress.  HENT:  Head: Normocephalic and atraumatic.  Right Ear: External ear normal.  Left Ear: External ear normal.  Nose: Nose normal.    Mouth/Throat: Oropharynx is clear and moist.  Eyes: Conjunctivae and EOM are normal. No scleral icterus.  Neck: Normal range of motion. Neck supple. No thyromegaly present.  Cardiovascular: Normal rate, regular rhythm, normal heart sounds and intact distal pulses.  Exam reveals no gallop.   No murmur heard. Pulmonary/Chest: Effort normal and breath sounds normal. No respiratory distress. She has no wheezes.  Abdominal: Soft. Bowel sounds are normal. She exhibits no distension and no mass. There is tenderness. There is no guarding.       Epig discomfort with moderate palpation  Genitourinary: Guaiac negative stool.       Normal ext female genitalia- no pelvic done  Musculoskeletal:       Stiffness in kness and ankles without redness,warmth or major deformities  Lymphadenopathy:    She has no cervical adenopathy.  Neurological: She is alert and oriented to person, place, and time. She has normal reflexes. No cranial nerve deficit. Coordination normal.  Skin: Skin is warm and dry. No rash noted. No erythema.  Psychiatric: Her behavior is normal. Judgment and thought content normal.       Mood normal; affect flat   Beck's Depression Inventory: Total score=2   02/03/12/LABS:  HbA1C= 5.9%    CBC normal   CMET normal  Results for orders placed in visit on 04/03/12  IFOBT (OCCULT BLOOD)      Component Value Range   IFOBT Negative  Assessment & Plan:   1. Need for prophylactic vaccination against Streptococcus pneumoniae (pneumococcus)  Pneumococcal polysaccharide vaccine 23-valent greater than or equal to 2yo subcutaneous/IM  2. Medicare annual wellness visit, subsequent  Pt declines recommended screenings at this time  Chronic medication RFs addressed (pt requested 90-day supply where appropriate)  3. Constipation  Increase fiber and maintain regular exercise schedule to improve bowel function  4. Osteoarthritis  Continue current plan as outlined by Ortho

## 2012-04-08 ENCOUNTER — Telehealth: Payer: Self-pay

## 2012-04-08 DIAGNOSIS — N644 Mastodynia: Secondary | ICD-10-CM

## 2012-04-08 NOTE — Telephone Encounter (Signed)
To MD to review 

## 2012-04-08 NOTE — Telephone Encounter (Signed)
Pt is needing to talk with someone about scheduling a mammogram, she is having a lot of breast pain, and also needs a refill on glipezide  Please call [phone]

## 2012-04-10 ENCOUNTER — Ambulatory Visit (INDEPENDENT_AMBULATORY_CARE_PROVIDER_SITE_OTHER): Payer: PRIVATE HEALTH INSURANCE | Admitting: Family Medicine

## 2012-04-10 VITALS — BP 119/71 | HR 81 | Temp 98.3°F | Resp 16 | Ht 61.0 in | Wt 171.0 lb

## 2012-04-10 DIAGNOSIS — H9319 Tinnitus, unspecified ear: Secondary | ICD-10-CM

## 2012-04-10 DIAGNOSIS — E119 Type 2 diabetes mellitus without complications: Secondary | ICD-10-CM

## 2012-04-10 DIAGNOSIS — R51 Headache: Secondary | ICD-10-CM

## 2012-04-10 LAB — POCT CBC
HCT, POC: 42 % (ref 37.7–47.9)
Hemoglobin: 13.7 g/dL (ref 12.2–16.2)
MCH, POC: 26.6 pg — AB (ref 27–31.2)
MPV: 8.9 fL (ref 0–99.8)
POC MID %: 7.3 %M (ref 0–12)
RBC: 5.15 M/uL (ref 4.04–5.48)
WBC: 4.7 10*3/uL (ref 4.6–10.2)

## 2012-04-10 LAB — POCT GLYCOSYLATED HEMOGLOBIN (HGB A1C): Hemoglobin A1C: 5.7

## 2012-04-10 LAB — BASIC METABOLIC PANEL
Chloride: 103 mEq/L (ref 96–112)
Creat: 0.73 mg/dL (ref 0.50–1.10)
Sodium: 143 mEq/L (ref 135–145)

## 2012-04-10 LAB — C-REACTIVE PROTEIN: CRP: 0.16 mg/dL (ref <0.60)

## 2012-04-10 MED ORDER — GLIPIZIDE 5 MG PO TABS: ORAL_TABLET | ORAL | Status: DC

## 2012-04-10 NOTE — Telephone Encounter (Signed)
Pt was seen for CPE recently and declined to be scheduled for MMG at that visit. MMG and medication RF have been addressed. Please notify pt.

## 2012-04-10 NOTE — Progress Notes (Signed)
Patient Name: Mandy Webb Date of Birth: 08-04-1946 Medical Record Number: [id number] Gender: female Date of Encounter: 04/10/2012  History of Present Illness:  Mandy Webb is a 66 y.o. very pleasant female patient who presents with the following:  She has noted a pressure in her head and ringing in her ears for a few days- actually on further questioning these symptoms have been present for months.  She was concerned that her BP was "going up."  However, she is not sure if her BP machine was working well- she got one reading of about 195/105 last night.  However, this reading was not consistent and the machine said "error" when she checked again a few minutes later.  She has had the tinnitus for a year or so.  It tends to wax and wane.  She also notes a HA on the right side of her head. Currently her HA is better but "still there."  No fever, no vomiting.  She has noted a dry throat and tries to drink plenty of water.   Patient Active Problem List  Diagnoses  . HEPATITIS C  . PITUITARY ADENOMA  . NEOPLASM UNCERTAIN BEHAVIOR STOMACH INTEST&RECT  . HYPOTHYROIDISM NOS  . DIABETES MELLITUS, TYPE II  . RESTLESS LEG SYNDROME  . PERIPHERAL NEUROPATHY  . HYPERTENSION  . ASTHMA  . GERD  . DYSPEPSIA  . CONSTIPATION  . NONSPECIFIC ABN FINDING RAD & OTH EXAM GI TRACT  . COLONIC POLYPS, HX OF   Past Medical History  Diagnosis Date  . Diabetes mellitus   . Hyperlipidemia   . Hypertension   . Allergy   . Asthma    No past surgical history on file. History  Substance Use Topics  . Smoking status: Former Smoker    Quit date: 02/02/1969  . Smokeless tobacco: Not on file  . Alcohol Use: Not on file   No family history on file. No Known Allergies  Medication list has been reviewed and updated.  Review of Systems: As per HPI- otherwise negative. She had her eyes checked 2 days ago and was told they looked ok  Physical Examination: Filed Vitals:   04/10/12 1107  BP:  119/71  Pulse: 81  Temp: 98.3 F (36.8 C)  TempSrc: Oral  Resp: 16  Height: 5\' 1"  (1.549 m)  Weight: 171 lb (77.565 kg)    Body mass index is 32.31 kg/(m^2).  GEN: WDWN, NAD, Non-toxic, A & O x 3, obese HEENT: Atraumatic, Normocephalic. Neck supple. No masses, No LAD.  PEERL, TM and oropharynx wnl.  No temporal nodules or tenderness- feels "like a pressure" in the right temporal area Ears and Nose: No external deformity. CV: RRR, No M/G/R. No JVD. No thrill. No extra heart sounds. PULM: CTA B, no wheezes, crackles, rhonchi. No retractions. No resp. distress. No accessory muscle use. ABD: S, NT, ND, +BS. No rebound. No HSM. EXTR: No c/c/e NEURO uses a cane. Full strength and sensation all extremities, normal DTR.  Full facial ROM PSYCH: Normally interactive. Conversant. Not depressed or anxious appearing.  Calm demeanor.   Results for orders placed in visit on 04/10/12  POCT CBC      Component Value Range   WBC 4.7  4.6 - 10.2 (K/uL)   Lymph, poc 1.8  0.6 - 3.4    POC LYMPH PERCENT 37.3  10 - 50 (%L)   MID (cbc) 0.3  0 - 0.9    POC MID % 7.3  0 - 12 (%M)   POC  Granulocyte 2.6  2 - 6.9    Granulocyte percent 55.4  37 - 80 (%G)   RBC 5.15  4.04 - 5.48 (M/uL)   Hemoglobin 13.7  12.2 - 16.2 (g/dL)   HCT, POC 16.1  09.6 - 47.9 (%)   MCV 81.5  80 - 97 (fL)   MCH, POC 26.6 (*) 27 - 31.2 (pg)   MCHC 32.6  31.8 - 35.4 (g/dL)   RDW, POC 04.5     Platelet Count, POC 220  142 - 424 (K/uL)   MPV 8.9  0 - 99.8 (fL)  GLUCOSE, POCT (MANUAL RESULT ENTRY)      Component Value Range   POC Glucose 106    POCT SEDIMENTATION RATE      Component Value Range   POCT SED RATE 19  0 - 22 (mm/hr)  POCT GLYCOSYLATED HEMOGLOBIN (HGB A1C)      Component Value Range   Hemoglobin A1C 5.7      Assessment and Plan: 1. Elevated blood pressure    2. Diabetes mellitus  POCT glucose (manual entry), POCT glycosylated hemoglobin (Hb A1C), Basic metabolic panel  3. Right temporal headache  POCT CBC, POCT  SEDIMENTATION RATE, C-reactive protein   Will check a sed rate to rule- out TA, also CMP.  Await other labs for further follow-up.  Discussed methods to help deal with tinnitus such as distraction and white noise.  Reassured that her DM is very well controlled and that her BP looks ok currently.  Suspect that the BP reading she got last night may not have been accurate.  She will let us know if she starts to feel worse or is not better in a few days.

## 2012-04-10 NOTE — Telephone Encounter (Signed)
Patient notified

## 2012-04-11 NOTE — Progress Notes (Signed)
Addended by: Abbe Amsterdam C on: 04/11/2012 01:44 PM   Modules accepted: Orders

## 2012-04-12 ENCOUNTER — Telehealth: Payer: Self-pay

## 2012-04-12 NOTE — Telephone Encounter (Signed)
LMOM to call back

## 2012-04-12 NOTE — Telephone Encounter (Signed)
PT STATES DR COPLAND IS SENDING HER TO HAVE SOME TESTS DONE TOMORROW, BUT SHE DIDN'T KNOW IF THE DR WAS GOING TO CALL HER IN SOME MEDICINE PLEASE CALL [phone]   [facility] ON [address]

## 2012-04-13 ENCOUNTER — Ambulatory Visit (INDEPENDENT_AMBULATORY_CARE_PROVIDER_SITE_OTHER): Payer: PRIVATE HEALTH INSURANCE | Admitting: Radiology

## 2012-04-13 DIAGNOSIS — E119 Type 2 diabetes mellitus without complications: Secondary | ICD-10-CM

## 2012-04-13 LAB — BASIC METABOLIC PANEL
BUN: 14 mg/dL (ref 6–23)
CO2: 32 mEq/L (ref 19–32)
Calcium: 9.2 mg/dL (ref 8.4–10.5)
Creat: 0.87 mg/dL (ref 0.50–1.10)
Glucose, Bld: 114 mg/dL — ABNORMAL HIGH (ref 70–99)
Sodium: 143 mEq/L (ref 135–145)

## 2012-04-13 NOTE — Telephone Encounter (Signed)
Pt was seen in our office today for labs

## 2012-04-16 ENCOUNTER — Other Ambulatory Visit: Payer: Self-pay | Admitting: Physician Assistant

## 2012-04-16 MED ORDER — POLYETHYLENE GLYCOL 3350 17 GM/SCOOP PO POWD: ORAL | Status: DC

## 2012-04-16 NOTE — Progress Notes (Signed)
Pharmacy sent request for Miralax RF. Done

## 2012-04-24 ENCOUNTER — Encounter: Payer: Self-pay | Admitting: Family Medicine

## 2012-05-02 ENCOUNTER — Other Ambulatory Visit: Payer: Self-pay | Admitting: Neurosurgery

## 2012-05-02 DIAGNOSIS — D497 Neoplasm of unspecified behavior of endocrine glands and other parts of nervous system: Secondary | ICD-10-CM

## 2012-05-10 ENCOUNTER — Ambulatory Visit
Admission: RE | Admit: 2012-05-10 | Discharge: 2012-05-10 | Disposition: A | Discharge status: Home / Self Care | Payer: Medicare Other | Source: Ambulatory Visit | Attending: Neurosurgery | Admitting: Neurosurgery

## 2012-05-10 DIAGNOSIS — D497 Neoplasm of unspecified behavior of endocrine glands and other parts of nervous system: Secondary | ICD-10-CM

## 2012-05-10 MED ORDER — GADOBENATE DIMEGLUMINE 529 MG/ML IV SOLN
7.0000 mL | Freq: Once | INTRAVENOUS | Status: AC | PRN
  Administered 2012-05-10: 7 mL via INTRAVENOUS

## 2012-05-16 ENCOUNTER — Other Ambulatory Visit: Payer: Self-pay | Admitting: Physician Assistant

## 2012-05-23 ENCOUNTER — Ambulatory Visit: Payer: Medicaid Other | Admitting: Family Medicine

## 2012-05-28 ENCOUNTER — Encounter: Payer: Self-pay | Admitting: Family Medicine

## 2012-05-28 ENCOUNTER — Ambulatory Visit (INDEPENDENT_AMBULATORY_CARE_PROVIDER_SITE_OTHER): Payer: Medicaid Other | Admitting: Family Medicine

## 2012-05-28 VITALS — BP 136/74 | HR 79 | Temp 98.5°F | Resp 16 | Ht 61.0 in | Wt 169.4 lb

## 2012-05-28 DIAGNOSIS — E1149 Type 2 diabetes mellitus with other diabetic neurological complication: Secondary | ICD-10-CM

## 2012-05-28 DIAGNOSIS — L989 Disorder of the skin and subcutaneous tissue, unspecified: Secondary | ICD-10-CM

## 2012-05-28 DIAGNOSIS — R69 Illness, unspecified: Secondary | ICD-10-CM

## 2012-05-28 MED ORDER — METOCLOPRAMIDE HCL 10 MG PO TABS: 10.0000 mg | ORAL_TABLET | ORAL | Status: DC

## 2012-05-28 MED ORDER — GABAPENTIN 600 MG PO TABS: 600.0000 mg | ORAL_TABLET | Freq: Four times a day (QID) | ORAL | Status: DC

## 2012-05-28 MED ORDER — MONTELUKAST SODIUM 10 MG PO TABS: 10.0000 mg | ORAL_TABLET | Freq: Every day | ORAL | Status: DC

## 2012-05-28 MED ORDER — RAMIPRIL 5 MG PO CAPS: 5.0000 mg | ORAL_CAPSULE | Freq: Every day | ORAL | Status: DC

## 2012-05-28 MED ORDER — FLUTICASONE-SALMETEROL 500-50 MCG/DOSE IN AEPB: 1.0000 | INHALATION_SPRAY | Freq: Two times a day (BID) | RESPIRATORY_TRACT | Status: DC

## 2012-05-28 MED ORDER — LEVOTHYROXINE SODIUM 75 MCG PO TABS: 75.0000 ug | ORAL_TABLET | Freq: Every day | ORAL | Status: DC

## 2012-05-28 MED ORDER — ATORVASTATIN CALCIUM 20 MG PO TABS: 20.0000 mg | ORAL_TABLET | Freq: Every day | ORAL | Status: DC

## 2012-05-28 MED ORDER — GLIPIZIDE 5 MG PO TABS: ORAL_TABLET | ORAL | Status: DC

## 2012-05-28 NOTE — Progress Notes (Signed)
  Subjective:    Patient ID: Mandy Webb, female    DOB: 1946/10/25, 66 y.o.   MRN: [id number]  HPI  This 66 y.o. Hispanic female returns for medication refills. She has Diabetic neuropathy that is  effectively treated with Gabapentin but she still has tingling and cramping in her toes. She is interested in  discussing other remedies for the discomfort (i.e. Herbal supplements- Tumeric and ginger). She is   compliant with medications denies hypoglycemia. She c/o small nodule on back of left hand that just appeared  within last few weeks. It is not painful nor has it been draining.. She has applied OTC topical cream to area.  She is asking that is be  removed.  Review of Systems Noncontributory     Objective:   Physical Exam  Vitals reviewed. Constitutional: She is oriented to person, place, and time. She appears well-developed and well-nourished. No distress.  HENT:  Head: Normocephalic and atraumatic.  Eyes: EOM are normal. No scleral icterus.  Cardiovascular: Normal rate.   Pulmonary/Chest: Effort normal. No respiratory distress.  Musculoskeletal: Normal range of motion.  Neurological: She is alert and oriented to person, place, and time. No cranial nerve deficit.  Skin: Skin is warm and dry.       Left hand (dorsum): 2 mm slightly raised hypopigmented nodule not adherent to underlying structure; nontender, no erythema, no discharge  Psychiatric: She has a normal mood and affect. Her behavior is normal.          Assessment & Plan:   1. Diabetic peripheral neuropathy - continue current medications; may try tumeric in small doses for anti-inflammatory benefits  2. Taking multiple medications for chronic disease - RFs authorized and routed to pharmacy  3. Skin lesion of hand - suspect it is a lipoma; advised against excision because of its small size.

## 2012-05-28 NOTE — Patient Instructions (Signed)
Neuropathy Neuropathy means your peripheral nerves are not working normally. Peripheral nerves are the nerves outside the brain and spinal cord. Messages between the brain and the rest of the body do not work properly with peripheral nerve disorders. CAUSES There are many different causes of peripheral nerve disorders. These include:  Injury.   Infections.   Diabetes.   Vitamin deficiency.   Poor circulation.   Alcoholism.   Exposure to toxins.   Drug effects.   Tumors.   Kidney disease.  SYMPTOMS  Tingling, burning, pain, and numbness in the extremities.   Weakness and loss of muscle tone and size.  DIAGNOSIS Blood tests and special studies of nerve function may help confirm the diagnosis.  TREATMENT  Treatment includes adopting healthy life habits.   A good diet, vitamin supplements, and mild pain medicine may be needed.   Avoid known toxins such as alcohol, tobacco, and recreational drugs.   Anti-convulsant medicines are helpful in some types of neuropathy.  Make a follow-up appointment with your caregiver to be sure you are getting better with treatment.  SEEK IMMEDIATE MEDICAL CARE IF:   You have breathing problems.   You have severe or uncontrolled pain.   You notice extreme weakness or you feel faint.   You are not better after 1 week or if you have worse symptoms.  Document Released: 01/11/2005 Document Revised: 08/16/2011 Document Reviewed: 12/04/2005 [employer] Patient Information 2012 [employer]. 

## 2012-06-13 ENCOUNTER — Telehealth: Payer: Self-pay

## 2012-06-13 NOTE — Telephone Encounter (Signed)
PT STATES THAT SHE MISSED HER FIRST DOSE OF NEURONTIN (4X'S A DAY) PT WOULD LIKE TO KNOW HOW SHOULD SHE SPACE THEM OUT NOW SINCE SHE MISSED THE FIRST DOSE.

## 2012-06-13 NOTE — Telephone Encounter (Signed)
She can just continue with next scheduled dose

## 2012-06-13 NOTE — Telephone Encounter (Signed)
Pt.notified

## 2012-06-21 ENCOUNTER — Ambulatory Visit (INDEPENDENT_AMBULATORY_CARE_PROVIDER_SITE_OTHER): Payer: Medicare Other | Admitting: Gastroenterology

## 2012-06-21 ENCOUNTER — Other Ambulatory Visit: Payer: Self-pay | Admitting: Internal Medicine

## 2012-06-21 ENCOUNTER — Encounter: Payer: Self-pay | Admitting: Gastroenterology

## 2012-06-21 VITALS — BP 120/72 | HR 72 | Ht 61.0 in | Wt 170.4 lb

## 2012-06-21 DIAGNOSIS — K5900 Constipation, unspecified: Secondary | ICD-10-CM

## 2012-06-21 DIAGNOSIS — K2970 Gastritis, unspecified, without bleeding: Secondary | ICD-10-CM

## 2012-06-21 NOTE — Patient Instructions (Addendum)
You will be set up for an upper endoscopy (LEC with propofol) for previously abnormal gastric biopsies, follow up.

## 2012-06-21 NOTE — Progress Notes (Signed)
Review of pertinent gastrointestinal problems: 1. History of colon polyps/colonoscopy 2005 in [place] (7mm polyp),Colonoscopy 2010 found no polyps or cancers, positive melanosis throughout colon. ++ Family history for colon cancer. Surveillance colonoscopy 2015.  2. Questionable history of stomach polyps or Barrett's. These were by endoscopies in [place]. Repeat esophagogastroduodenoscopy by Dr. Christella Hartigan January 2007 showed normal upper gastrointestinal evaluation. No Barrett's, no polyps.  3. Chronic constipation. Well treated with a variety of agents, including an herbal medicine that she takes, aloe vera, Miralax p.r.n.  4. intermitent abd pains: CT 08/2008 essentially negative, felt Mandy was from chronic constipation. Ultrasound abdomen on November 16, 2009, showed diffuse fatty infiltration of the liver, otherwise unremarkable. HIDA scan on December, 2010, showed normal gallbladder ejection fraction, otherwise an unremarkable study.  5. abnormal gastric biopsies, June 2011. Moderate to severe gastritis noted. Biopsies taken. No H. pylori however pathology suggested possible carcinoid reaction. July 2011; August, 2011 repeat EGD performed; pan gastritis, pathology showed gastritis EC hyperplasia, NE features. Octreotide scan normal. 2011, intrinsic factor antibodies negative.   HPI: Mandy is a   pleasant 66 year old Webb whom I last saw a year and a half ago or so.  She has been taking miralax, usually daily.  She has uncomfortable LUQ pain, feels like a knot.  She has small plops of stools.  Tries to eat small portions.  She appears sedated a bit, perhaps just very tired.  No nausea or vomiting.  She has been trying to lose weight, successfully by exercise and eating less.  Down 9 pounds in about 1-2 years on our scale  Past Medical History  Diagnosis Date  . Diabetes mellitus   . Hyperlipidemia   . Hypertension   . Allergy   . Asthma     Past Surgical History  Procedure Date  .  Appendectomy   . Pituitary surgery     Current Outpatient Prescriptions  Medication Sig Dispense Refill  . albuterol (PROVENTIL HFA;VENTOLIN HFA) 108 (90 BASE) MCG/ACT inhaler Inhale 2 puffs into the lungs every 6 (six) hours as needed.      Marland Kitchen atorvastatin (LIPITOR) 20 MG tablet Take 1 tablet (20 mg total) by mouth daily.  90 tablet  1  . brimonidine (ALPHAGAN P) 0.1 % SOLN Place 1 drop into both eyes 3 (three) times daily.      . brinzolamide (AZOPT) 1 % ophthalmic suspension 1 drop 3 (three) times daily.      . carboxymethylcellulose (REFRESH PLUS) 0.5 % SOLN 1 drop 4 (four) times daily.      . fish oil-omega-3 fatty acids 1000 MG capsule Take 2 g by mouth daily.      . Fluticasone-Salmeterol (ADVAIR) 500-50 MCG/DOSE AEPB Inhale 1 puff into the lungs every 12 (twelve) hours.  60 each  5  . gabapentin (NEURONTIN) 600 MG tablet Take 1 tablet (600 mg total) by mouth 4 (four) times daily.  360 tablet  1  . glipiZIDE (GLUCOTROL) 5 MG tablet Take 1/2 tablet twice a day before meals  90 tablet  1  . glucose blood (PRODIGY TEST) test strip Use as instructed  100 each  0  . levothyroxine (SYNTHROID, LEVOTHROID) 75 MCG tablet Take 1 tablet (75 mcg total) by mouth daily.  90 tablet  1  . metoCLOPramide (REGLAN) 10 MG tablet Take 1 tablet (10 mg total) by mouth 1 day or 1 dose.  90 tablet  1  . montelukast (SINGULAIR) 10 MG tablet Take 1 tablet (10 mg total) by  mouth at bedtime.  90 tablet  1  . Multiple Vitamins-Minerals (MULTIVITAMIN WITH MINERALS) tablet Take 1 tablet by mouth daily. One Source Advanced Adult Multi withD3      . naproxen (NAPROSYN) 500 MG tablet Take 500 mg by mouth 1 day or 1 dose.      . polyethylene glycol powder (GLYCOLAX/MIRALAX) powder As directed  3350 g  1  . ramipril (ALTACE) 5 MG capsule Take 1 capsule (5 mg total) by mouth daily.  90 capsule  1  . triamcinolone (KENALOG) 0.025 % cream Apply topically 2 (two) times daily.        Allergies as of 06/21/2012  . (No Known  Allergies)    Family History  Problem Relation Age of Onset  . Stomach cancer Mother     History   Social History  . Marital Status: Single    Spouse Name: N/A    Number of Children: 3  . Years of Education: N/A   Occupational History  . Retired    Social History Main Topics  . Smoking status: Former Smoker    Quit date: 02/02/1969  . Smokeless tobacco: Never Used  . Alcohol Use: No  . Drug Use: No  . Sexually Active: Not on file   Other Topics Concern  . Not on file   Social History Narrative  . No narrative on file      Physical Exam: BP 120/72  Pulse 72  Ht 5\' 1"  (1.549 m)  Wt 170 lb 6.4 oz (77.293 kg)  BMI 32.20 kg/m2 Constitutional: generally well-appearing Psychiatric: alert and oriented x3 Abdomen: soft, nontender, nondistended, no obvious ascites, no peritoneal signs, normal bowel sounds     Assessment and plan: 66 y.o. female with her usual chronic constipation complaints, intermittent left upper quadrant pain, history of abnormal gastric biopsies  She did have significant intestinal metaplasia on biopsies about 2 years ago I think we should repeat those now with EGD. She is on numerous medicines I think she would best be sedated with propofol.

## 2012-07-15 ENCOUNTER — Telehealth: Payer: Self-pay

## 2012-07-15 DIAGNOSIS — E785 Hyperlipidemia, unspecified: Secondary | ICD-10-CM

## 2012-07-15 NOTE — Telephone Encounter (Signed)
The patient called regarding Lipitor Rx.  The patient stated the last pill she took was on Friday 07/12/12 and the pharmacy will not refill the medication until 08/04/12.  The patient requests new Rx so that insurance will cover Lipitor before 08/04/12.  Please call the patient at [phone] or on her cell at [phone].

## 2012-07-16 MED ORDER — ATORVASTATIN CALCIUM 20 MG PO TABS: 20.0000 mg | ORAL_TABLET | Freq: Every day | ORAL | Status: DC

## 2012-07-26 ENCOUNTER — Ambulatory Visit (AMBULATORY_SURGERY_CENTER): Payer: PRIVATE HEALTH INSURANCE | Admitting: Gastroenterology

## 2012-07-26 ENCOUNTER — Encounter: Payer: Self-pay | Admitting: Gastroenterology

## 2012-07-26 VITALS — BP 166/89 | HR 73 | Temp 98.1°F | Resp 15

## 2012-07-26 DIAGNOSIS — K2990 Gastroduodenitis, unspecified, without bleeding: Secondary | ICD-10-CM

## 2012-07-26 DIAGNOSIS — K2970 Gastritis, unspecified, without bleeding: Secondary | ICD-10-CM

## 2012-07-26 DIAGNOSIS — K2960 Other gastritis without bleeding: Secondary | ICD-10-CM

## 2012-07-26 LAB — GLUCOSE, CAPILLARY: Glucose-Capillary: 102 mg/dL — ABNORMAL HIGH (ref 70–99)

## 2012-07-26 MED ORDER — SODIUM CHLORIDE 0.9 % IV SOLN: 500.0000 mL | INTRAVENOUS | Status: DC

## 2012-07-26 NOTE — Progress Notes (Signed)
Patient did not experience any of the following events: a burn prior to discharge; a fall within the facility; wrong site/side/patient/procedure/implant event; or a hospital transfer or hospital admission upon discharge from the facility. (G8907) Patient did not have preoperative order for IV antibiotic SSI prophylaxis. (G8918)  

## 2012-07-26 NOTE — Op Note (Signed)
[facility] [address] [place], [place]  [address]  ENDOSCOPY PROCEDURE REPORT  PATIENT:  Mandy Webb, Mandy Webb  MR#:  [id number] BIRTHDATE:  1946-07-23, 66 yrs. old  GENDER:  female ENDOSCOPIST:  Rachael Fee, MD PROCEDURE DATE:  07/26/2012 PROCEDURE:  EGD with biopsy, 62[address] ASA CLASS:  Class III INDICATIONS:  1. intermitent abd pains: CT 08/2008 essentially negative, felt this was from chronic constipation. Ultrasound abdomen on November 16, 2009, showed diffuse fatty infiltration of the liver, otherwise unremarkable. HIDA scan on December, 2010, showed normal gallbladder ejection fraction, otherwise an unremarkable study. 2. abnormal gastric biopsies, June 2011. Moderate to severe gastritis noted. Biopsies taken. No H. pylori however pathology suggested possible carcinoid reaction. July 2011; August, 2011 repeat EGD performed; pan gastritis, pathology showed gastritis EC hyperplasia, NE features. Octreotide scan normal. 2011, intrinsic factor antibodies negative. MEDICATIONS:  MAC sedation, administered by CRNA, propofol (Diprivan) 120 mcg IV TOPICAL ANESTHETIC:  none DESCRIPTION OF PROCEDURE:   After the risks benefits and alternatives of the procedure were thoroughly explained, informed consent was obtained.  The [facility] [id number] endoscope was introduced through the mouth and advanced to the second portion of the duodenum, without limitations.  The instrument was slowly withdrawn as the mucosa was fully examined. <<PROCEDUREIMAGES>> A hiatal hernia was found. This was 2cm (see image5). There was moderate pan-gastritis. Biopsies taken and sent to pathology (jar 1) (see image1).  Otherwise the examination was normal (see image6, image3, and image2).    Retroflexed views revealed no abnormalities.    The scope was then withdrawn from the patient and the procedure completed. COMPLICATIONS:  None  ENDOSCOPIC IMPRESSION: 1) Small hiatal hernia 2) Moderate pan  gastritis. Biopsies taken 3) Otherwise normal examination  RECOMMENDATIONS: Await biopsy results  ______________________________ Rachael Fee, MD  n. eSIGNED:   Rachael Fee at 07/26/2012 09:06 AM  Eloisa Northern, [phone]

## 2012-07-26 NOTE — Patient Instructions (Signed)
Gastritis seen today, biopsies taken. Await biopsy results. Thank you!!  YOU HAD AN ENDOSCOPIC PROCEDURE TODAY AT THE [facility]: Refer to the procedure report that was given to you for any specific questions about what was found during the examination.  If the procedure report does not answer your questions, please call your gastroenterologist to clarify.  If you requested that your care partner not be given the details of your procedure findings, then the procedure report has been included in a sealed envelope for you to review at your convenience later.  YOU SHOULD EXPECT: Some feelings of bloating in the abdomen. Passage of more gas than usual.  Walking can help get rid of the air that was put into your GI tract during the procedure and reduce the bloating. If you had a lower endoscopy (such as a colonoscopy or flexible sigmoidoscopy) you may notice spotting of blood in your stool or on the toilet paper. If you underwent a bowel prep for your procedure, then you may not have a normal bowel movement for a few days.  DIET: Your first meal following the procedure should be a light meal and then it is ok to progress to your normal diet.  A half-sandwich or bowl of soup is an example of a good first meal.  Heavy or fried foods are harder to digest and may make you feel nauseous or bloated.  Likewise meals heavy in dairy and vegetables can cause extra gas to form and this can also increase the bloating.  Drink plenty of fluids but you should avoid alcoholic beverages for 24 hours.  ACTIVITY: Your care partner should take you home directly after the procedure.  You should plan to take it easy, moving slowly for the rest of the day.  You can resume normal activity the day after the procedure however you should NOT DRIVE or use heavy machinery for 24 hours (because of the sedation medicines used during the test).    SYMPTOMS TO REPORT IMMEDIATELY: A gastroenterologist can be reached at any hour.   During normal business hours, 8:30 AM to 5:00 PM Monday through Friday, call [phone].  After hours and on weekends, please call the GI answering service at [phone] who will take a message and have the physician on call contact you.   Following upper endoscopy (EGD)  Vomiting of blood or coffee ground material  New chest pain or pain under the shoulder blades  Painful or persistently difficult swallowing  New shortness of breath  Fever of 100F or higher  Black, tarry-looking stools  FOLLOW UP: If any biopsies were taken you will be contacted by phone or by letter within the next 1-3 weeks.  Call your gastroenterologist if you have not heard about the biopsies in 3 weeks.  Our staff will call the home number listed on your records the next business day following your procedure to check on you and address any questions or concerns that you may have at that time regarding the information given to you following your procedure. This is a courtesy call and so if there is no answer at the home number and we have not heard from you through the emergency physician on call, we will assume that you have returned to your regular daily activities without incident.  SIGNATURES/CONFIDENTIALITY: You and/or your care partner have signed paperwork which will be entered into your electronic medical record.  These signatures attest to the fact that that the information above on your After  Visit Summary has been reviewed and is understood.  Full responsibility of the confidentiality of this discharge information lies with you and/or your care-partner.

## 2012-07-29 ENCOUNTER — Telehealth: Payer: Self-pay | Admitting: *Deleted

## 2012-07-29 NOTE — Telephone Encounter (Signed)
  Follow up Call-  Call back number 07/26/2012  Post procedure Call Back phone  # [phone]  Permission to leave phone message Yes     Patient questions:  Busy x 2.   Unable to reach or leave a message.

## 2012-07-31 ENCOUNTER — Encounter: Payer: Self-pay | Admitting: Gastroenterology

## 2012-08-07 ENCOUNTER — Ambulatory Visit (INDEPENDENT_AMBULATORY_CARE_PROVIDER_SITE_OTHER): Payer: PRIVATE HEALTH INSURANCE | Admitting: Family Medicine

## 2012-08-07 ENCOUNTER — Encounter: Payer: Self-pay | Admitting: Family Medicine

## 2012-08-07 VITALS — BP 95/58 | HR 82 | Temp 98.0°F | Resp 18 | Ht 60.0 in | Wt 169.0 lb

## 2012-08-07 DIAGNOSIS — E785 Hyperlipidemia, unspecified: Secondary | ICD-10-CM

## 2012-08-07 DIAGNOSIS — E039 Hypothyroidism, unspecified: Secondary | ICD-10-CM

## 2012-08-07 DIAGNOSIS — H9319 Tinnitus, unspecified ear: Secondary | ICD-10-CM

## 2012-08-07 DIAGNOSIS — E119 Type 2 diabetes mellitus without complications: Secondary | ICD-10-CM

## 2012-08-07 DIAGNOSIS — R252 Cramp and spasm: Secondary | ICD-10-CM

## 2012-08-07 LAB — BASIC METABOLIC PANEL
BUN: 10 mg/dL (ref 6–23)
Chloride: 101 mEq/L (ref 96–112)
Creat: 0.74 mg/dL (ref 0.50–1.10)
Glucose, Bld: 129 mg/dL — ABNORMAL HIGH (ref 70–99)

## 2012-08-07 LAB — TSH: TSH: 0.932 u[IU]/mL (ref 0.350–4.500)

## 2012-08-07 MED ORDER — ATORVASTATIN CALCIUM 20 MG PO TABS: 20.0000 mg | ORAL_TABLET | Freq: Every day | ORAL | Status: DC

## 2012-08-07 NOTE — Progress Notes (Signed)
  Subjective:    Patient ID: Mandy Webb, female    DOB: 1946/10/28, 66 y.o.   MRN: [id number]  HPI  Thsi pleasant woman returns for DM follow-up. She checks FSBS ~3x/week (fasting= 85-100). She denies hypoglycemia. Her daily routine (diet and activity) is fairly consistent. No problems with feet. She has hypothyroidism and sees Dr. Talmage Nap who monitors and prescribes Levothyroxine. She has significant muscle cramping in legs and feet. She was taking Vit D supplement but was advised to discontinue this several months ago.    Review of Systems  Constitutional: Negative.   HENT: Positive for tinnitus. Negative for hearing loss, ear pain, congestion, sore throat, postnasal drip and sinus pressure.   Eyes: Negative for visual disturbance.  Respiratory: Negative for cough, chest tightness and shortness of breath.   Cardiovascular: Negative.   Musculoskeletal: Positive for myalgias, arthralgias and gait problem.  Neurological: Negative for dizziness, syncope, weakness, light-headedness, numbness and headaches.  Psychiatric/Behavioral: Negative.        Objective:   Physical Exam  Nursing note and vitals reviewed. Constitutional: She is oriented to person, place, and time. She appears well-developed and well-nourished. No distress.  HENT:  Head: Normocephalic.  Right Ear: Hearing, tympanic membrane and external ear normal.  Left Ear: Hearing, tympanic membrane and external ear normal.  Nose: Nose normal. No mucosal edema, rhinorrhea, nasal deformity or septal deviation. Right sinus exhibits no maxillary sinus tenderness and no frontal sinus tenderness. Left sinus exhibits no maxillary sinus tenderness and no frontal sinus tenderness.  Mouth/Throat: Uvula is midline, oropharynx is clear and moist and mucous membranes are normal. No oral lesions. No dental caries.       EACs are erythematous with minimal cerumen  Eyes: Conjunctivae and EOM are normal. Right eye exhibits no discharge. Left  eye exhibits no discharge. No scleral icterus.  Neck: Neck supple. No thyromegaly present.  Cardiovascular: Normal rate and regular rhythm.   Pulmonary/Chest: Effort normal. No respiratory distress.  Musculoskeletal: She exhibits no edema and no tenderness.  Lymphadenopathy:    She has no cervical adenopathy.  Neurological: She is alert and oriented to person, place, and time. She displays abnormal reflex. No cranial nerve deficit. Coordination normal.       DTRs 0-1+ in upper ext and 1-2+ in lower ext  Skin: Skin is warm and dry.  Psychiatric: She has a normal mood and affect. Her behavior is normal. Judgment and thought content normal.    Results for orders placed in visit on 08/07/12  POCT GLYCOSYLATED HEMOGLOBIN (HGB A1C)      Component Value Range   Hemoglobin A1C 5.8           Assessment & Plan:   1. Type II or unspecified type diabetes mellitus without mention of complication, not stated as uncontrolled -well controlled POCT glycosylated hemoglobin (Hb A1C) Continue current medications  2. Muscle cramps  Basic metabolic panel, Vitamin D, 25-hydroxy  3. Hypothyroidism  TSH, T4, Free  4. Tinnitus  Ambulatory referral to ENT  5. Hyperlipidemia  atorvastatin (LIPITOR) 20 MG tablet

## 2012-08-07 NOTE — Patient Instructions (Signed)
Tinnitus Sounds you hear in your ears and coming from within the ear is called tinnitus. This can be a symptom of many ear disorders. It is often associated with hearing loss.  Tinnitus can be seen with:  Infections.   Ear blockages such as wax buildup.   Meniere's disease.   Ear damage.   Inherited.   Occupational causes.  While irritating, it is not usually a threat to health. When the cause of the tinnitus is wax, infection in the middle ear, or foreign body it is easily treated. Hearing loss will usually be reversible.  TREATMENT  When treating the underlying cause does not get rid of tinnitus, it may be necessary to get rid of the unwanted sound by covering it up with more pleasant background noises. This may include music, the radio etc. There are tinnitus maskers which can be worn which produce background noise to cover up the tinnitus. Avoid all medications which tend to make tinnitus worse such as alcohol, caffeine, aspirin, and nicotine. There are many soothing background tapes such as rain, ocean, thunderstorms, etc. These soothing sounds help with sleeping or resting. Keep all follow-up appointments and referrals. This is important to identify the cause of the problem. It also helps avoid complications, impaired hearing, disability, or chronic pain. Document Released: 12/04/2005 Document Revised: 11/23/2011 Document Reviewed: 07/22/2008 Exit[employer] Patient Information 2012 [employer]. 

## 2012-08-09 NOTE — Progress Notes (Signed)
Quick Note:  Please call pt and advise that the following labs are abnormal... Blood sugar is a little high but no change of current medications. Thyroid tests are in normal range so you are taking the right dose of medications.  Vitamin D is a little low (same as 2 years ago); get over-the-counter Vitamin D supplement 2000 IU per capsule and try to take it most days of the week as well as getting 10-15 minutes of sun exposure most days of the week.  Other labs look good.  Copy to pt. ______

## 2012-08-15 ENCOUNTER — Telehealth: Payer: Self-pay | Admitting: Gastroenterology

## 2012-08-15 NOTE — Telephone Encounter (Signed)
Patient has found the letter that was sent by Dr Christella Hartigan regarding EGD results and has no further questions.

## 2012-09-02 ENCOUNTER — Other Ambulatory Visit: Payer: Self-pay | Admitting: Family Medicine

## 2012-09-30 ENCOUNTER — Ambulatory Visit (INDEPENDENT_AMBULATORY_CARE_PROVIDER_SITE_OTHER): Payer: PRIVATE HEALTH INSURANCE | Admitting: Family Medicine

## 2012-09-30 VITALS — BP 112/70 | HR 79 | Temp 98.0°F | Resp 18 | Ht 61.0 in | Wt 171.0 lb

## 2012-09-30 DIAGNOSIS — E11649 Type 2 diabetes mellitus with hypoglycemia without coma: Secondary | ICD-10-CM

## 2012-09-30 DIAGNOSIS — E119 Type 2 diabetes mellitus without complications: Secondary | ICD-10-CM

## 2012-09-30 MED ORDER — METFORMIN HCL ER 500 MG PO TB24: 500.0000 mg | ORAL_TABLET | Freq: Every day | ORAL | Status: DC

## 2012-09-30 NOTE — Progress Notes (Signed)
  Subjective:    Patient ID: Mandy Webb, female    DOB: 02/23/1946, 66 y.o.   MRN: [id number] Chief Complaint  Patient presents with  . Diabetes    sugar is flunctuating drastically    HPI  Hasn't been feeling well for the first few days - was feeling dizzy, headfloating, out-of-it, pre-syncopal, unbalanced. Yest she decided to check sugar 31. And she was able to eat some crackers and then make some fried eggs and some rice and sugar shot up to 180.  She then rechecked it 2 hrs later and sugar was 173 and 2 hrs later sugar was 91 then before bed 183.   Taking glipizide 2.5 mg bid - has been on this regimen for a long time. Metformin made her queasy    Review of Systems    BP 112/70  Pulse 79  Temp(Src) 98 F (36.7 C)  Resp 18  Ht 5\' 1"  (1.549 m)  Wt 171 lb (77.565 kg)  BMI 32.33 kg/m2 Objective:   Physical Exam        Assessment & Plan:  1. Hypoglycemia with DMII -   Hypoglycemia associated with diabetes - Plan: POCT glucose (manual entry)  Meds ordered this encounter  Medications  . DISCONTD: metFORMIN (GLUCOPHAGE XR) 500 MG 24 hr tablet    Sig: Take 1 tablet (500 mg total) by mouth daily with breakfast.    Dispense:  30 tablet    Refill:  2

## 2012-10-24 ENCOUNTER — Other Ambulatory Visit: Payer: Self-pay | Admitting: Family Medicine

## 2012-10-30 ENCOUNTER — Encounter: Payer: Self-pay | Admitting: Family Medicine

## 2012-10-30 ENCOUNTER — Ambulatory Visit (INDEPENDENT_AMBULATORY_CARE_PROVIDER_SITE_OTHER): Payer: PRIVATE HEALTH INSURANCE | Admitting: Family Medicine

## 2012-10-30 VITALS — BP 136/76 | HR 74 | Temp 98.2°F | Resp 16 | Ht 59.5 in | Wt 166.8 lb

## 2012-10-30 DIAGNOSIS — E785 Hyperlipidemia, unspecified: Secondary | ICD-10-CM

## 2012-10-30 DIAGNOSIS — I10 Essential (primary) hypertension: Secondary | ICD-10-CM

## 2012-10-30 DIAGNOSIS — Z760 Encounter for issue of repeat prescription: Secondary | ICD-10-CM

## 2012-10-30 DIAGNOSIS — E119 Type 2 diabetes mellitus without complications: Secondary | ICD-10-CM

## 2012-10-30 LAB — COMPREHENSIVE METABOLIC PANEL
AST: 18 U/L (ref 0–37)
BUN: 10 mg/dL (ref 6–23)
Calcium: 9.5 mg/dL (ref 8.4–10.5)
Chloride: 102 mEq/L (ref 96–112)
Creat: 0.7 mg/dL (ref 0.50–1.10)
Glucose, Bld: 102 mg/dL — ABNORMAL HIGH (ref 70–99)

## 2012-10-30 LAB — POCT GLYCOSYLATED HEMOGLOBIN (HGB A1C): Hemoglobin A1C: 5.6

## 2012-10-30 MED ORDER — METOCLOPRAMIDE HCL 10 MG PO TABS: 10.0000 mg | ORAL_TABLET | ORAL | Status: DC

## 2012-10-30 MED ORDER — LEVOTHYROXINE SODIUM 75 MCG PO TABS: 75.0000 ug | ORAL_TABLET | Freq: Every day | ORAL | Status: DC

## 2012-10-30 MED ORDER — GLUCOSE BLOOD VI STRP: ORAL_STRIP | Status: DC

## 2012-10-30 MED ORDER — ALBUTEROL SULFATE HFA 108 (90 BASE) MCG/ACT IN AERS: 2.0000 | INHALATION_SPRAY | Freq: Four times a day (QID) | RESPIRATORY_TRACT | Status: DC | PRN

## 2012-10-30 MED ORDER — NAPROXEN 500 MG PO TABS: 500.0000 mg | ORAL_TABLET | Freq: Two times a day (BID) | ORAL | Status: DC

## 2012-10-30 MED ORDER — ATORVASTATIN CALCIUM 20 MG PO TABS: 20.0000 mg | ORAL_TABLET | Freq: Every day | ORAL | Status: DC

## 2012-10-30 MED ORDER — MONTELUKAST SODIUM 10 MG PO TABS: 10.0000 mg | ORAL_TABLET | Freq: Every day | ORAL | Status: DC

## 2012-10-30 MED ORDER — METFORMIN HCL ER 500 MG PO TB24: 500.0000 mg | ORAL_TABLET | Freq: Every day | ORAL | Status: DC

## 2012-10-30 MED ORDER — RAMIPRIL 5 MG PO CAPS: 5.0000 mg | ORAL_CAPSULE | Freq: Every day | ORAL | Status: DC

## 2012-10-30 MED ORDER — GABAPENTIN 600 MG PO TABS: 600.0000 mg | ORAL_TABLET | Freq: Four times a day (QID) | ORAL | Status: DC

## 2012-10-30 NOTE — Progress Notes (Signed)
S:  Pt here for follow-up after severe hypoglycemia; she was started on Metformin and is tolerating it w/o side effects. FSBS= 127=187. No GI side effects. Pt is eating routinely and has managed to lose weight. She reports compliance with medications.  ROS: Negative for diaphoresis, fatigue, fever/ chills, CP or tightness, palpitations, SOB, edema, HA, dizziness, numbness, weakness or syncope.  O:  Filed Vitals:   10/30/12 1025  BP: 136/76  Pulse: 74  Temp: 98.2 F (36.8 C)  Resp: 16   GEN: In NAD; WN,WD.  HENT: [place]/AT; otherwise unremarkable. COR: RRR. LUNGS: Normal resp rate and effort. ABD: Soft, nontender w/o HSM or masses. NEURO: A&O x 3; CNs intact. Nonfocal.   A1c= 5.6%   A/P: 1. Type II or unspecified type diabetes mellitus without mention of complication, not stated as uncontrolled  Comprehensive metabolic panel  2. HTN (hypertension)    3. Hyperlipidemia  RF: atorvastatin (LIPITOR) 20 MG tablet   Medication refills completed as requested.

## 2012-11-01 ENCOUNTER — Telehealth: Payer: Self-pay

## 2012-11-01 NOTE — Telephone Encounter (Signed)
We [facility] stating that they dont make prodigy test strips anymore.  Need to ask what machine she would like and do she know what her ins pays for?  lmom to call back.

## 2012-11-01 NOTE — Telephone Encounter (Signed)
PATIENT'S STATES SHE NEEDS TO GET A LETTER SAYING THAT SHE CAME TO [facility] NOV. 13, 2013 TO SEE DR. MCPHERSON. SHE NEEDS IT FOR [place]. PLEASE CALL HER AND SHE CAN GIVE YOU MORE DETAILS AS TO WHY SHE NEEDS THIS LETTER. BEST PHONE [phone] (CELL)  MBC

## 2012-11-01 NOTE — Progress Notes (Signed)
Quick Note:  Please notify pt that results are normal.   Provide pt with copy of labs. ______ 

## 2012-11-02 NOTE — Telephone Encounter (Signed)
PT STATES THAT SHE NEEDS [place] TO FAX VERIFICATION THAT SHE WAS HERE November 13TH 2013 TO [employer]. BEST# [id number] FAX FOR [employer]: [phone]

## 2012-11-02 NOTE — Telephone Encounter (Signed)
Spoke with pt, she doesn't know what insurance will pay for. She cannot afford another machine so insurance will have to cover it. I called the pharmacy to see if they could tell me which machine would her insurance pay for. The pharmacist states we will have to just pick another machine and run it through, there is no way of finding this out. Please send in another machine and test strips.

## 2012-11-04 ENCOUNTER — Other Ambulatory Visit: Payer: Self-pay | Admitting: *Deleted

## 2012-11-04 MED ORDER — BLOOD GLUCOSE METER KIT: PACK | Status: DC

## 2012-11-04 MED ORDER — GLUCOSE BLOOD VI STRP: ORAL_STRIP | Status: DC

## 2012-11-04 MED ORDER — ONETOUCH ULTRASOFT LANCETS MISC: Status: DC

## 2012-11-04 MED ORDER — FINGERSTIX LANCETS MISC: Status: DC

## 2012-11-04 NOTE — Telephone Encounter (Signed)
Pt CB and I advised her to call her ins customer service # to find out what brand of glucometer and test strips they cover and all [place] back so that we can send in Rxs for this brand. Pt agreed. Pt also clarified that she needs a letter stating that she was seen here on 10/30/12 faxed to [employer] dept so that she will be able to get transportation next time. Faxed letter as requested.

## 2012-11-04 NOTE — Telephone Encounter (Signed)
1) Please call the transportation office and send them what they need.  2) The patient should call the customer service number on her card and ask what glucometer they cover.  We're happy to write for whatever they cover, but can't be "just trying" all the different ones to find out what gets approved.

## 2012-11-04 NOTE — Telephone Encounter (Signed)
Patient states strips are no longer being made for her glucose meter she wants new one sent to [facility],she checks her sugar, tid

## 2012-11-04 NOTE — Addendum Note (Signed)
Addended byCaffie Damme on: 11/04/2012 03:18 PM   Modules accepted: Orders

## 2012-11-04 NOTE — Telephone Encounter (Signed)
She states the pharmacy at [facility] will help her find one which is covered, Rx sent to her.

## 2012-11-05 NOTE — Telephone Encounter (Signed)
Pt called back to verify that we had sent in her Rxs for glucometer and supplies. Advised her that per [facility] Rx w/Dx code and signature was faxed in last evening.

## 2012-11-06 ENCOUNTER — Telehealth: Payer: Self-pay | Admitting: Radiology

## 2012-11-06 MED ORDER — BLOOD GLUCOSE METER KIT: PACK | Status: DC

## 2012-11-06 MED ORDER — ONETOUCH ULTRASOFT LANCETS MISC: Status: DC

## 2012-11-06 MED ORDER — FINGERSTIX LANCETS MISC: Status: DC

## 2012-11-06 NOTE — Telephone Encounter (Signed)
Patient called back again,the [facility] still can not help her with the glucose meter, orders have all been sent multiple times, with everything she needs. I have advised her to change pharmacies, since they can not help her. She wants order sent to [facility] on [address]. This is done for her.

## 2012-11-13 ENCOUNTER — Other Ambulatory Visit: Payer: Self-pay

## 2012-11-13 ENCOUNTER — Telehealth: Payer: Self-pay

## 2012-11-13 MED ORDER — ALBUTEROL SULFATE HFA 108 (90 BASE) MCG/ACT IN AERS: 2.0000 | INHALATION_SPRAY | Freq: Four times a day (QID) | RESPIRATORY_TRACT | Status: DC | PRN

## 2012-11-13 NOTE — Telephone Encounter (Signed)
Called ins to do a PA on pt's Ventolin Rx and was told that they prefer Pro-Air instead unless pt has had trial/failed or SE from Pro-Air. Spoke w/pt who stated she has not tried the Pro-Air in the past and is OK to change to that. Checked w/Heather to make sure OK to change to Pro-Air and sending in new Rx to [facility].

## 2012-12-15 ENCOUNTER — Telehealth: Payer: Self-pay

## 2012-12-15 NOTE — Telephone Encounter (Signed)
Patient is calling to refill metformin.  [facility] [address], [place] - [address]

## 2012-12-17 MED ORDER — METFORMIN HCL ER 500 MG PO TB24: 500.0000 mg | ORAL_TABLET | Freq: Every day | ORAL | Status: DC

## 2012-12-17 NOTE — Telephone Encounter (Signed)
Have sent in for her.

## 2012-12-25 ENCOUNTER — Telehealth: Payer: Self-pay

## 2012-12-25 MED ORDER — METFORMIN HCL ER 500 MG PO TB24: 500.0000 mg | ORAL_TABLET | Freq: Every day | ORAL | Status: DC

## 2012-12-25 NOTE — Telephone Encounter (Signed)
Changed Rx to 90 day supply and notified pt.

## 2012-12-25 NOTE — Telephone Encounter (Signed)
PT STATES SHE WOULD LIKE TO GET HER METFORMIN EVERY 3 MONTHS INSTEAD OF EVERY 30 DAYS. DO NOT WANT TO RUN OUT, BUT HER INSURANCE WILL COVER 3 MONTHS MUCH BETTER THAN 30 DAYS. PLEASE CALL [phone]    [facility] [address]

## 2013-01-06 ENCOUNTER — Telehealth: Payer: Self-pay | Admitting: Gastroenterology

## 2013-01-06 NOTE — Telephone Encounter (Signed)
Pt would like to see Dr Christella Hartigan in Feb  She has some occasional pain on the right and left side.  She is going out of town until Feb 3.  She is continuing to have constipation on Miralax once daily.  Pt was advised to try twice daily and only go back to once daily if diarrhea occurs. Pt agreed.

## 2013-01-24 ENCOUNTER — Ambulatory Visit: Payer: PRIVATE HEALTH INSURANCE | Admitting: Gastroenterology

## 2013-02-05 ENCOUNTER — Encounter: Payer: Self-pay | Admitting: Family Medicine

## 2013-02-05 ENCOUNTER — Ambulatory Visit (INDEPENDENT_AMBULATORY_CARE_PROVIDER_SITE_OTHER): Payer: PRIVATE HEALTH INSURANCE | Admitting: Family Medicine

## 2013-02-05 VITALS — BP 108/68 | HR 84 | Temp 98.5°F | Resp 16 | Ht 60.0 in | Wt 160.0 lb

## 2013-02-05 DIAGNOSIS — K219 Gastro-esophageal reflux disease without esophagitis: Secondary | ICD-10-CM

## 2013-02-05 DIAGNOSIS — E559 Vitamin D deficiency, unspecified: Secondary | ICD-10-CM

## 2013-02-05 DIAGNOSIS — R202 Paresthesia of skin: Secondary | ICD-10-CM

## 2013-02-05 DIAGNOSIS — R209 Unspecified disturbances of skin sensation: Secondary | ICD-10-CM

## 2013-02-05 DIAGNOSIS — Z13820 Encounter for screening for osteoporosis: Secondary | ICD-10-CM

## 2013-02-05 DIAGNOSIS — E119 Type 2 diabetes mellitus without complications: Secondary | ICD-10-CM

## 2013-02-05 LAB — BASIC METABOLIC PANEL
Calcium: 9.7 mg/dL (ref 8.4–10.5)
Chloride: 102 mEq/L (ref 96–112)
Creat: 0.68 mg/dL (ref 0.50–1.10)

## 2013-02-05 LAB — LIPID PANEL
HDL: 46 mg/dL (ref >39)
LDL Cholesterol: 79 mg/dL (ref 0–99)
Total CHOL/HDL Ratio: 3.5 Ratio
Triglycerides: 183 mg/dL — ABNORMAL HIGH (ref <150)
VLDL: 37 mg/dL (ref 0–40)

## 2013-02-05 MED ORDER — METFORMIN HCL ER 500 MG PO TB24: 500.0000 mg | ORAL_TABLET | Freq: Every day | ORAL | Status: DC

## 2013-02-05 MED ORDER — PANTOPRAZOLE SODIUM 20 MG PO TBEC: 20.0000 mg | DELAYED_RELEASE_TABLET | Freq: Every day | ORAL | Status: DC

## 2013-02-05 NOTE — Patient Instructions (Addendum)
You have a Vitamin D deficiency and have been advised to take a Vitamin D supplement in addition to the multivitamin that you take.  You will be notified about your labs within the next 7-10 days.    Your next  Visit will be for Medicare Annual Wellness exam.

## 2013-02-05 NOTE — Progress Notes (Signed)
S:  This 67 y.o [place] female has Type II DM -well controlled on current medications w/o adverse effects. FSBS results "good" per pt report; no symptoms c/w hypoglycemia.   Pt c/o intermittent tremor in both hands but no numbness or weakness. She says that her hands feel like they are "jerking" a little; this is infrequent and not always associated with movement. Pt denies neck pain and has no hx of neck or upper back trauma.  Pt takes a statin for lipid disorder and needs labs checked. She denies arthralgias or myalgias or abdominal pain that may be related to statin. She has a hx of arthritis and neuropathy for which she takes Naproxen and Gabapentin.  ROS: As per HPI.  O:  Filed Vitals:   02/05/13 0918  BP: 108/68  Pulse: 84  Temp: 98.5 F (36.9 C)  Resp: 16   GEN: In NAD; WN,WD. HENT: [place]/AT; EOMI w/ clear conj/ sclerae. Oroph clear and moist. NECK: Nontender w/ good ROM. No muscle tenderness or atrophy in upper ext. BACK: Upper back w/o muscle tenderness, deformity or atrophy. EXT: MAEs w/o c/c/e. See DM foot exam. COR: RRR. Pulses intact 2+/=. LUNGS: Normal resp rate and effort. NEURO: A&O x 3; CNs intact. DTRs 2+/= in upper ext. Nonfocal exam.    A/P:  Paresthesia of hand- normal exam; monitor and check Vitamin D level.  Type II or unspecified type diabetes mellitus without mention of complication, not stated as uncontrolled - [place] A1c in Nov 2103= 5.6%  Plan: Lipid panel, Basic metabolic panel  GERD (gastroesophageal reflux disease)  RF: Pantoprazole  90-day supply per pt request.  Screening for osteoporosis - Plan: DG Bone Density  Unspecified vitamin D deficiency - Plan: Vitamin D, 25-hydroxy   Meds ordered this encounter  Medications  . DISCONTD: pantoprazole (PROTONIX) 20 MG tablet    Sig: Take 1 tablet (20 mg total) by mouth daily.    Dispense:  30 tablet    Refill:  3  . pantoprazole (PROTONIX) 20 MG tablet    Sig: Take 1 tablet (20 mg total) by mouth daily.    Dispense:  90 tablet    Refill:  1  . metFORMIN (GLUCOPHAGE XR) 500 MG 24 hr tablet    Sig: Take 1 tablet (500 mg total) by mouth daily with breakfast.    Dispense:  90 tablet    Refill:  1  . naproxen (NAPROSYN) 500 MG tablet    Sig: Take 1 tablet (500 mg total) by mouth 2 (two) times daily with a meal.    Dispense:  60 tablet    Refill:  3

## 2013-02-06 LAB — VITAMIN D 25 HYDROXY (VIT D DEFICIENCY, FRACTURES): Vit D, 25-Hydroxy: 31 ng/mL (ref 30–89)

## 2013-02-06 NOTE — Progress Notes (Signed)
Quick Note:  Please call pt and advise that the following labs are abnormal... Vitamin D level is low; continue taking any supplement that has Vitamin D in it (like a multi-vitamin) but also get additional Vitamin D 1000 IU and take 1 capsule daily.  All other labs are okay. Your blood sugar was a little high as weel as Triglycerides, which is a form of fat in your blood. Continue all current medications and try to eat healthier and stay active so you can lose a little weight.  Copy to pt. ______

## 2013-02-07 MED ORDER — NAPROXEN 500 MG PO TABS: 500.0000 mg | ORAL_TABLET | Freq: Two times a day (BID) | ORAL | Status: DC

## 2013-02-10 ENCOUNTER — Ambulatory Visit (HOSPITAL_COMMUNITY)
Admission: RE | Admit: 2013-02-10 | Discharge: 2013-02-10 | Disposition: A | Discharge status: Home / Self Care | Payer: PRIVATE HEALTH INSURANCE | Source: Ambulatory Visit | Attending: Family Medicine | Admitting: Family Medicine

## 2013-02-10 DIAGNOSIS — Z13820 Encounter for screening for osteoporosis: Secondary | ICD-10-CM | POA: Insufficient documentation

## 2013-02-10 DIAGNOSIS — Z780 Asymptomatic menopausal state: Secondary | ICD-10-CM | POA: Insufficient documentation

## 2013-02-21 ENCOUNTER — Ambulatory Visit: Payer: PRIVATE HEALTH INSURANCE | Admitting: Gastroenterology

## 2013-02-27 ENCOUNTER — Encounter: Payer: PRIVATE HEALTH INSURANCE | Admitting: Family Medicine

## 2013-03-03 ENCOUNTER — Encounter (HOSPITAL_COMMUNITY): Payer: Self-pay | Admitting: Emergency Medicine

## 2013-03-03 ENCOUNTER — Emergency Department (HOSPITAL_COMMUNITY)
Admission: EM | Admit: 2013-03-03 | Discharge: 2013-03-03 | Disposition: A | Discharge status: Home / Self Care | Payer: PRIVATE HEALTH INSURANCE | Attending: Emergency Medicine | Admitting: Emergency Medicine

## 2013-03-03 ENCOUNTER — Emergency Department (HOSPITAL_COMMUNITY): Payer: PRIVATE HEALTH INSURANCE

## 2013-03-03 DIAGNOSIS — E785 Hyperlipidemia, unspecified: Secondary | ICD-10-CM | POA: Insufficient documentation

## 2013-03-03 DIAGNOSIS — Z8739 Personal history of other diseases of the musculoskeletal system and connective tissue: Secondary | ICD-10-CM | POA: Insufficient documentation

## 2013-03-03 DIAGNOSIS — E1169 Type 2 diabetes mellitus with other specified complication: Secondary | ICD-10-CM | POA: Insufficient documentation

## 2013-03-03 DIAGNOSIS — R1013 Epigastric pain: Secondary | ICD-10-CM | POA: Insufficient documentation

## 2013-03-03 DIAGNOSIS — R739 Hyperglycemia, unspecified: Secondary | ICD-10-CM

## 2013-03-03 DIAGNOSIS — R197 Diarrhea, unspecified: Secondary | ICD-10-CM | POA: Insufficient documentation

## 2013-03-03 DIAGNOSIS — I10 Essential (primary) hypertension: Secondary | ICD-10-CM | POA: Insufficient documentation

## 2013-03-03 DIAGNOSIS — J45909 Unspecified asthma, uncomplicated: Secondary | ICD-10-CM | POA: Insufficient documentation

## 2013-03-03 DIAGNOSIS — Z9109 Other allergy status, other than to drugs and biological substances: Secondary | ICD-10-CM | POA: Insufficient documentation

## 2013-03-03 DIAGNOSIS — Z79899 Other long term (current) drug therapy: Secondary | ICD-10-CM | POA: Insufficient documentation

## 2013-03-03 DIAGNOSIS — K219 Gastro-esophageal reflux disease without esophagitis: Secondary | ICD-10-CM | POA: Insufficient documentation

## 2013-03-03 DIAGNOSIS — Z87891 Personal history of nicotine dependence: Secondary | ICD-10-CM | POA: Insufficient documentation

## 2013-03-03 DIAGNOSIS — R112 Nausea with vomiting, unspecified: Secondary | ICD-10-CM

## 2013-03-03 LAB — POCT I-STAT 3, ART BLOOD GAS (G3+)
Acid-base deficit: 3 mmol/L — ABNORMAL HIGH (ref 0.0–2.0)
Bicarbonate: 24.2 mEq/L — ABNORMAL HIGH (ref 20.0–24.0)
Patient temperature: 98.7
TCO2: 26 mmol/L (ref 0–100)
pH, Arterial: 7.304 — ABNORMAL LOW (ref 7.350–7.450)

## 2013-03-03 LAB — COMPREHENSIVE METABOLIC PANEL
ALT: 17 U/L (ref 0–35)
AST: 22 U/L (ref 0–37)
Calcium: 9 mg/dL (ref 8.4–10.5)
Creatinine, Ser: 0.69 mg/dL (ref 0.50–1.10)
Sodium: 143 mEq/L (ref 135–145)
Total Protein: 7.8 g/dL (ref 6.0–8.3)

## 2013-03-03 LAB — CBC WITH DIFFERENTIAL/PLATELET
Basophils Absolute: 0 10*3/uL (ref 0.0–0.1)
Basophils Relative: 0 % (ref 0–1)
Eosinophils Absolute: 0 10*3/uL (ref 0.0–0.7)
Eosinophils Relative: 0 % (ref 0–5)
MCH: 26 pg (ref 26.0–34.0)
MCHC: 34.5 g/dL (ref 30.0–36.0)
MCV: 75.6 fL — ABNORMAL LOW (ref 78.0–100.0)
Monocytes Absolute: 0.2 10*3/uL (ref 0.1–1.0)
Platelets: 189 10*3/uL (ref 150–400)
RDW: 13.5 % (ref 11.5–15.5)
WBC: 8 10*3/uL (ref 4.0–10.5)

## 2013-03-03 LAB — GLUCOSE, CAPILLARY: Glucose-Capillary: 170 mg/dL — ABNORMAL HIGH (ref 70–99)

## 2013-03-03 LAB — POCT I-STAT TROPONIN I: Troponin i, poc: 0 ng/mL (ref 0.00–0.08)

## 2013-03-03 LAB — TROPONIN I: Troponin I: <0.3 ng/mL (ref <0.30)

## 2013-03-03 MED ORDER — SODIUM CHLORIDE 0.9 % IV BOLUS (SEPSIS)
1000.0000 mL | Freq: Once | INTRAVENOUS | Status: AC
  Administered 2013-03-03: 1000 mL via INTRAVENOUS

## 2013-03-03 MED ORDER — ONDANSETRON HCL 4 MG/2ML IJ SOLN
4.0000 mg | Freq: Once | INTRAMUSCULAR | Status: AC
  Administered 2013-03-03: 4 mg via INTRAVENOUS
  Filled 2013-03-03: qty 2

## 2013-03-03 MED ORDER — SODIUM CHLORIDE 0.9 % IV BOLUS (SEPSIS)
500.0000 mL | Freq: Once | INTRAVENOUS | Status: AC
  Administered 2013-03-03: 500 mL via INTRAVENOUS

## 2013-03-03 MED ORDER — GI COCKTAIL ~~LOC~~
30.0000 mL | Freq: Once | ORAL | Status: AC
  Administered 2013-03-03: 30 mL via ORAL
  Filled 2013-03-03: qty 30

## 2013-03-03 MED ORDER — ONDANSETRON 4 MG PO TBDP: 4.0000 mg | ORAL_TABLET | Freq: Three times a day (TID) | ORAL | Status: DC | PRN

## 2013-03-03 NOTE — ED Notes (Signed)
Patient placed on 02 at 2L [place].

## 2013-03-03 NOTE — ED Provider Notes (Signed)
History     CSN: [id number]  Arrival date & time 03/03/13  0800   First MD Initiated Contact with Patient 03/03/13 [phone]      Chief Complaint  Patient presents with  . Emesis  . Diarrhea    (Consider location/radiation/quality/duration/timing/severity/associated sxs/prior treatment) HPI Comments: Pt with PMH significant for DM, HTN, HLP presents to the ED for nausea, vomiting, and diarrhea x several hours.  States sx came on suddenly and she attributed it to her blood sugar.  FBS was 158 PTA.  Vomiting and diarrhea have resolved at this time but is now having some epigastric pain.  Pt recently was switched from Prilosec to Protonix for management of GERD.  States she does not feel like it is helping her sx.  Also admits that her FBS has been high for the past several mornings despite taking all medications as directed.  Denies any chest pain, SOB, fever, cough, chills, or dysuria.  The history is provided by the patient.    Past Medical History  Diagnosis Date  . Diabetes mellitus   . Hyperlipidemia   . Hypertension   . Allergy   . Asthma   . Arthritis     Past Surgical History  Procedure Laterality Date  . Appendectomy    . Pituitary surgery    . Tubal ligation    . Abdominal hysterectomy      Family History  Problem Relation Age of Onset  . Stomach cancer Mother     History  Substance Use Topics  . Smoking status: Former Smoker    Quit date: 02/02/1969  . Smokeless tobacco: Never Used  . Alcohol Use: No    OB History   Grav Para Term Preterm Abortions TAB SAB Ect Mult Living                  Review of Systems  Gastrointestinal: Positive for nausea, vomiting, abdominal pain and diarrhea.  All other systems reviewed and are negative.    Allergies  Review of patient's allergies indicates no known allergies.  Home Medications   Current Outpatient Rx  Name  Route  Sig  Dispense  Refill  . albuterol (PROVENTIL HFA;VENTOLIN HFA) 108 (90 BASE) MCG/ACT  inhaler   Inhalation   Inhale 2 puffs into the lungs every 6 (six) hours as needed for wheezing (cough, shortness of breath or wheezing.).   1 Inhaler   5     PLEASE FILL WITH PRO-AIR INHALER   . atorvastatin (LIPITOR) 20 MG tablet   Oral   Take 1 tablet (20 mg total) by mouth daily.   90 tablet   1   . Blood Glucose Monitoring Suppl (BLOOD GLUCOSE METER) kit      Use as instructed   1 each   0     Dx code 250.00 patient checks blood sugar tid   . brimonidine (ALPHAGAN P) 0.1 % SOLN   Both Eyes   Place 1 drop into both eyes 3 (three) times daily.         . brinzolamide (AZOPT) 1 % ophthalmic suspension      1 drop 3 (three) times daily.         . carboxymethylcellulose (REFRESH PLUS) 0.5 % SOLN      1 drop 4 (four) times daily.         . fish oil-omega-3 fatty acids 1000 MG capsule   Oral   Take 2 g by mouth daily.         Marland Kitchen  Fluticasone-Salmeterol (ADVAIR) 500-50 MCG/DOSE AEPB   Inhalation   Inhale 1 puff into the lungs every 12 (twelve) hours.   60 each   5   . gabapentin (NEURONTIN) 600 MG tablet   Oral   Take 1 tablet (600 mg total) by mouth 4 (four) times daily.   360 tablet   1   . glucose blood test strip      Use as instructed   100 each   12     Dx 250.00 patient checks blood sugar tid, due to f ...   . Lancets (ONETOUCH ULTRASOFT) lancets      Use as instructed. Dx code 250.00 patient checks blood sugar tid   100 each   12   . levothyroxine (SYNTHROID, LEVOTHROID) 75 MCG tablet   Oral   Take 1 tablet (75 mcg total) by mouth daily.   90 tablet   1   . metFORMIN (GLUCOPHAGE XR) 500 MG 24 hr tablet   Oral   Take 1 tablet (500 mg total) by mouth daily with breakfast.   90 tablet   1   . metoCLOPramide (REGLAN) 10 MG tablet   Oral   Take 1 tablet (10 mg total) by mouth 1 day or 1 dose.   90 tablet   1   . montelukast (SINGULAIR) 10 MG tablet   Oral   Take 1 tablet (10 mg total) by mouth at bedtime.   90 tablet   1   .  Multiple Vitamins-Minerals (MULTIVITAMIN WITH MINERALS) tablet   Oral   Take 1 tablet by mouth daily. One Source Advanced Adult Multi withD3         . naproxen (NAPROSYN) 500 MG tablet   Oral   Take 1 tablet (500 mg total) by mouth 2 (two) times daily with a meal.   60 tablet   3   . pantoprazole (PROTONIX) 20 MG tablet   Oral   Take 1 tablet (20 mg total) by mouth daily.   90 tablet   1   . polyethylene glycol powder (GLYCOLAX/MIRALAX) powder      As directed   3350 g   1   . ramipril (ALTACE) 5 MG capsule   Oral   Take 1 capsule (5 mg total) by mouth daily.   90 capsule   1   . triamcinolone (KENALOG) 0.025 % cream   Topical   Apply topically 2 (two) times daily.           BP 149/84  Pulse 118  Temp(Src) 98.1 F (36.7 C) (Oral)  Resp 20  SpO2 94%  Physical Exam  Nursing note and vitals reviewed. Constitutional: She is oriented to person, place, and time. Vital signs are normal. She appears well-developed and well-nourished.  HENT:  Head: Normocephalic and atraumatic.  Mouth/Throat: Oropharynx is clear and moist.  Eyes: Conjunctivae and EOM are normal.  Neck: Normal range of motion. Neck supple.  Cardiovascular: Normal rate, regular rhythm and normal heart sounds.   Pulmonary/Chest: Effort normal and breath sounds normal. She has no wheezes. She has no rhonchi.  Abdominal: Soft. Bowel sounds are normal. There is tenderness in the epigastric area. There is no rigidity, no guarding, no CVA tenderness, no tenderness at McBurney's point and negative Murphy's sign.  Neurological: She is alert and oriented to person, place, and time. She has normal strength. No sensory deficit. Coordination normal.  CN grossly intact  Skin: Skin is warm and dry.  Psychiatric: She has a  normal mood and affect. Her speech is normal.    ED Course  Procedures (including critical care time)   Date: 03/03/2013  Rate: 106  Rhythm: sinus tachycardia  QRS Axis: normal   Intervals: normal  ST/T Wave abnormalities: normal  Conduction Disutrbances:none  Narrative Interpretation:   Old EKG Reviewed: none available    Labs Reviewed  CBC WITH DIFFERENTIAL - Abnormal; Notable for the following:    RBC 5.49 (*)    MCV 75.6 (*)    Neutrophils Relative 94 (*)    Lymphocytes Relative 3 (*)    Lymphs Abs 0.3 (*)    All other components within normal limits  COMPREHENSIVE METABOLIC PANEL - Abnormal; Notable for the following:    Glucose, Bld 191 (*)    GFR calc non Af Amer 87 (*)    All other components within normal limits  GLUCOSE, CAPILLARY - Abnormal; Notable for the following:    Glucose-Capillary 170 (*)    All other components within normal limits  POCT I-STAT 3, BLOOD GAS (G3+) - Abnormal; Notable for the following:    pH, Arterial 7.304 (*)    pCO2 arterial 48.7 (*)    Bicarbonate 24.2 (*)    Acid-base deficit 3.0 (*)    All other components within normal limits  LIPASE, BLOOD  D-DIMER, QUANTITATIVE  TROPONIN I  POCT I-STAT TROPONIN I   Dg Chest 2 View  03/03/2013  *RADIOLOGY REPORT*  Clinical Data: Left side chest pain.  CHEST - 2 VIEW  Comparison: Plain film of the chest 11/17/2010 and CT chest 07/21/2011.  Findings: Lungs are clear.  Heart size is normal.  No pneumothorax or pleural effusion.  No focal bony abnormality.  IMPRESSION: No acute disease.   Original Report Authenticated By: Holley Dexter, M.D.      1. Hyperglycemia   2. Nausea & vomiting       MDM   67 y.o. Female PMH significant for DM, HTN, HLP presents to the ED for nausea, vomiting, and diarrhea x several hours.  Now has residual epigastric abdominal pain.  Recently switched from prilosec to protonix for GERD- states she does not feel like it is controlling her sx.  Glucose at 191- several episodes of hyperglycemia recently.  IVF and zofran given with good resolution of sx.  Pt is tolerating PO fluids without difficulty.  Pulse ox fluctuating btwn 80's and 90's.  ABG  showing good oxygenation.  Pt ok for discharge.  Instructed to F/U with PCP within the week for tighter glucose control and management of GERD sx.  Return precautions discussed     Garlon Hatchet, PA-C 03/03/13 1442

## 2013-03-03 NOTE — ED Notes (Signed)
Patient ambulated to restroom and tolerated well.  

## 2013-03-03 NOTE — ED Notes (Signed)
Patient states she started throwing up several hours ago and had diarrhea 2 x.   Patient advised her sugar was high at 158.   Patient has stopped throwing up at this time.

## 2013-03-03 NOTE — ED Notes (Signed)
Checked patient blood sugar it was 170 notifed RN Eme of blood sugar

## 2013-03-03 NOTE — ED Notes (Signed)
O2 discontinued before new reading.   Removed nail polish from patient and was able to get a steady reading.

## 2013-03-03 NOTE — ED Provider Notes (Signed)
Medical screening examination/treatment/procedure(s) were conducted as a shared visit with non-physician practitioner(s) and myself.  I personally evaluated the patient during the encounter  Nausea, vomiting, diarrhea for several hours. No chest pain, abdominal pain, SOB. Pulse ox low 2/2 nail polish.  ABG with PO2 85.  Glynn Octave, MD 03/03/13 [phone]

## 2013-03-05 ENCOUNTER — Telehealth: Payer: Self-pay | Admitting: Gastroenterology

## 2013-03-05 NOTE — Telephone Encounter (Signed)
Pt notified that the appt is the soonest Dr Christella Hartigan has and will be put on a wait list pt agreed and will call back if she worsens

## 2013-03-08 ENCOUNTER — Other Ambulatory Visit: Payer: Self-pay | Admitting: Family Medicine

## 2013-03-19 ENCOUNTER — Encounter: Payer: Self-pay | Admitting: Gastroenterology

## 2013-03-19 ENCOUNTER — Ambulatory Visit (INDEPENDENT_AMBULATORY_CARE_PROVIDER_SITE_OTHER): Payer: PRIVATE HEALTH INSURANCE | Admitting: Gastroenterology

## 2013-03-19 VITALS — BP 110/64 | HR 93 | Ht 60.0 in | Wt 157.0 lb

## 2013-03-19 DIAGNOSIS — R1012 Left upper quadrant pain: Secondary | ICD-10-CM

## 2013-03-19 DIAGNOSIS — K5900 Constipation, unspecified: Secondary | ICD-10-CM

## 2013-03-19 MED ORDER — METOCLOPRAMIDE HCL 10 MG PO TABS: 10.0000 mg | ORAL_TABLET | ORAL | Status: DC

## 2013-03-19 MED ORDER — POLYETHYLENE GLYCOL 3350 17 GM/SCOOP PO POWD: 17.0000 g | Freq: Every day | ORAL | Status: DC | PRN

## 2013-03-19 NOTE — Patient Instructions (Addendum)
You will be set up for a CT scan of abdomen and pelvis with IV and oral contrast for LUQ pain. Your recent vomiting, diarrheal illness was almost certainly an infection (probably viral). Adjust your miralx to 1 1/2 doses per day for your bowels.   You have been scheduled for a CT scan of the abdomen and pelvis at [facility] ([address]---this is in the same building as [facility]).   You are scheduled on 03/24/13 at 2 pm. You should arrive 15 minutes prior to your appointment time for registration. Please follow the written instructions below on the day of your exam:  WARNING: IF YOU ARE ALLERGIC TO IODINE/X-RAY DYE, PLEASE NOTIFY RADIOLOGY IMMEDIATELY AT [phone]! YOU WILL BE GIVEN A 13 HOUR PREMEDICATION PREP.  1) Do not eat or drink anything after 10 am (4 hours prior to your test) 2) You have been given 2 bottles of oral contrast to drink. The solution may taste better if refrigerated, but do NOT add ice or any other liquid to this solution. Shake well before drinking.    Drink 1 bottle of contrast @ 12 noon  (2 hours prior to your exam)  Drink 1 bottle of contrast @ 1 pm  (1 hour prior to your exam)  You may take any medications as prescribed with a small amount of water except for the following: Metformin, Glucophage, Glucovance, Avandamet, Riomet, Fortamet, Actoplus Met, Janumet, Glumetza or Metaglip. The above medications must be held the day of the exam AND 48 hours after the exam.  The purpose of you drinking the oral contrast is to aid in the visualization of your intestinal tract. The contrast solution may cause some diarrhea. Before your exam is started, you will be given a small amount of fluid to drink. Depending on your individual set of symptoms, you may also receive an intravenous injection of x-ray contrast/dye. Plan on being at [facility] for 30 minutes or long, depending on the type of exam you are having performed.  If you have any  questions regarding your exam or if you need to reschedule, you may call the CT department at [phone] between the hours of 8:00 am and 5:00 pm, Monday-Friday.                                               We are excited to introduce MyChart, a new best-in-class service that provides you online access to important information in your electronic medical record. We want to make it easier for you to view your health information - all in one secure location - when and where you need it. We expect MyChart will enhance the quality of care and service we provide.  When you register for MyChart, you can:    View your test results.    Request appointments and receive appointment reminders via email.    Request medication renewals.    View your medical history, allergies, medications and immunizations.    Communicate with your physician's office through a password-protected site.    Conveniently print information such as your medication lists.  To find out if MyChart is right for you, please talk to a member of our clinical staff today. We will gladly answer your questions about this free health and wellness tool.  If you are age 69 or older and want a member  of your family to have access to your record, you must provide written consent by completing a proxy form available at our office. Please speak to our clinical staff about guidelines regarding accounts for patients younger than age 63.  As you activate your MyChart account and need any technical assistance, please call the MyChart technical support line at [phone]) or email your question to [email or web address]. If you email your question(s), please include your name, a return phone number and the best time to reach you.  If you have non-urgent health-related questions, you can send a message to our office through MyChart at [email or web address]. If you have a medical emergency, call 911.  Thank you for using  MyChart as your new health and wellness resource!   MyChart licensed from [employer],  [phone]. Patents Pending.   ________________________________________________________________________

## 2013-03-19 NOTE — Progress Notes (Signed)
Review of pertinent gastrointestinal problems:  1. History of colon polyps/colonoscopy 2005 in [place] (7mm polyp),Colonoscopy 2010 found no polyps or cancers, positive melanosis throughout colon. ++ Family history for colon cancer. Surveillance colonoscopy 2015.  2. Questionable history of stomach polyps or Barrett's. These were by endoscopies in [place]. Repeat esophagogastroduodenoscopy by Dr. Christella Hartigan January 2007 showed normal upper gastrointestinal evaluation. No Barrett's, no polyps.  3. Chronic constipation. Well treated with a variety of agents, including an herbal medicine that she takes, aloe vera, Miralax p.r.n.  4. intermitent abd pains: CT 08/2008 essentially negative, felt this was from chronic constipation. Ultrasound abdomen on November 16, 2009, showed diffuse fatty infiltration of the liver, otherwise unremarkable. HIDA scan on December, 2010, showed normal gallbladder ejection fraction, otherwise an unremarkable study.  5. abnormal gastric biopsies, June 2011. Moderate to severe gastritis noted. Biopsies taken. No H. pylori however pathology suggested possible carcinoid reaction. July 2011; August, 2011 repeat EGD performed; pan gastritis, pathology showed gastritis EC hyperplasia, NE features. Octreotide scan normal. 2011, intrinsic factor antibodies negative.  EGD 07/2012 ([facility]) 1) Small hiatal hernia 2) Moderate pan gastritis. Biopsies taken 3) Otherwise normal examination.  Biopsies showed no IM, only mild chronic gastritis without H. Pylori    HPI: This is a   very pleasant 67 year old woman whom I last saw 9 months ago at the time of upper endoscopy. See those results summarized above   She went to ER last month, awoke with diarrhea/vomiting acutely.  This lasted 3 days.  Numerous times every day.  She went to ER.  I reviewed labs, essentially normal.    Both her daughters had same symptoms shortly afterwards.  She also has her usual myriad GI symptoms, most focused on the  left upper quadrant pain, discomfort. She feels that she can feel a bump in her belly.  Takes miralax twice a day (one dose each), this causes diarrhea.  If she takes it once a day (dosen't really work very well).       Past Medical History  Diagnosis Date  . Diabetes mellitus   . Hyperlipidemia   . Hypertension   . Allergy   . Asthma   . Arthritis     Past Surgical History  Procedure Laterality Date  . Appendectomy    . Pituitary surgery    . Tubal ligation    . Abdominal hysterectomy      Current Outpatient Prescriptions  Medication Sig Dispense Refill  . albuterol (PROVENTIL HFA;VENTOLIN HFA) 108 (90 BASE) MCG/ACT inhaler Inhale 2 puffs into the lungs every 6 (six) hours as needed for wheezing (cough, shortness of breath or wheezing.).  1 Inhaler  5  . atorvastatin (LIPITOR) 20 MG tablet Take 1 tablet (20 mg total) by mouth daily.  90 tablet  1  . Blood Glucose Monitoring Suppl (BLOOD GLUCOSE METER) kit Use as instructed  1 each  0  . brimonidine (ALPHAGAN P) 0.1 % SOLN Place 1 drop into both eyes 3 (three) times daily.      . brinzolamide (AZOPT) 1 % ophthalmic suspension 1 drop 3 (three) times daily.      . carboxymethylcellulose (REFRESH PLUS) 0.5 % SOLN 1 drop 4 (four) times daily.      . fish oil-omega-3 fatty acids 1000 MG capsule Take 2 g by mouth daily as needed (for vitamin).       . Fluticasone-Salmeterol (ADVAIR) 500-50 MCG/DOSE AEPB Inhale 1 puff into the lungs every 12 (twelve) hours.  60 each  5  . gabapentin (NEURONTIN) 600 MG tablet Take 1 tablet (600 mg total) by mouth 4 (four) times daily.  360 tablet  1  . glucose blood test strip Use as instructed  100 each  12  . hydroxypropyl methylcellulose (ISOPTO TEARS) 2.5 % ophthalmic solution Place 1 drop into both eyes 3 (three) times daily as needed.      . Lancets (ONETOUCH ULTRASOFT) lancets Use as instructed. Dx code 250.00 patient checks blood sugar tid  100 each  12  . levothyroxine (SYNTHROID,  LEVOTHROID) 75 MCG tablet Take 1 tablet (75 mcg total) by mouth daily.  90 tablet  1  . metFORMIN (GLUCOPHAGE XR) 500 MG 24 hr tablet Take 1 tablet (500 mg total) by mouth daily with breakfast.  90 tablet  1  . metoCLOPramide (REGLAN) 10 MG tablet Take 1 tablet (10 mg total) by mouth 1 day or 1 dose.  90 tablet  1  . montelukast (SINGULAIR) 10 MG tablet TAKE ONE TABLET BY MOUTH EVERY DAY AT BEDTIME  90 tablet  0  . Multiple Vitamins-Minerals (MULTIVITAMIN WITH MINERALS) tablet Take 1 tablet by mouth daily. One Source Advanced Adult Multi withD3      . naproxen (NAPROSYN) 500 MG tablet Take 1 tablet (500 mg total) by mouth 2 (two) times daily with a meal.  60 tablet  3  . ondansetron (ZOFRAN ODT) 4 MG disintegrating tablet Take 1 tablet (4 mg total) by mouth every 8 (eight) hours as needed for nausea.  12 tablet  0  . pantoprazole (PROTONIX) 20 MG tablet Take 1 tablet (20 mg total) by mouth daily.  90 tablet  1  . polyethylene glycol powder (GLYCOLAX/MIRALAX) powder Take 17 g by mouth daily as needed (fror constipation).      . ramipril (ALTACE) 5 MG capsule Take 1 capsule (5 mg total) by mouth daily.  90 capsule  1  . triamcinolone (KENALOG) 0.025 % cream Apply 1 application topically 2 (two) times daily as needed (for skin).        No current facility-administered medications for this visit.    Allergies as of 03/19/2013  . (No Known Allergies)    Family History  Problem Relation Age of Onset  . Stomach cancer Mother     History   Social History  . Marital Status: Single    Spouse Name: N/A    Number of Children: 3  . Years of Education: N/A   Occupational History  . Retired    Social History Main Topics  . Smoking status: Former Smoker    Quit date: 02/02/1969  . Smokeless tobacco: Never Used  . Alcohol Use: No  . Drug Use: No  . Sexually Active: Not on file   Other Topics Concern  . Not on file   Social History Narrative  . No narrative on file      Physical  Exam: BP 110/64  Pulse 93  Ht 5' (1.524 m)  Wt 157 lb (71.215 kg)  BMI 30.66 kg/m2 Constitutional:  Obese, slow mentation  Psychiatric: alert and oriented x3 Abdomen: soft, nontender, nondistended, no obvious ascites, no peritoneal signs, normal bowel sounds     Assessment and plan: 67 y.o. female with recent infectious gastroenteritis, left upper quadrant pain  I'm going to arrange CT scan abdomen and pelvis for left upper quadrant pain. I suspect her recent acute GI illness was infectious, likely viral especially since 2 of her daughters had same symptoms shortly afterwards. She has issues  with chronic constipation and I recommended she try 1-1/2 doses of MiraLax every day.

## 2013-03-24 ENCOUNTER — Ambulatory Visit (INDEPENDENT_AMBULATORY_CARE_PROVIDER_SITE_OTHER)
Admission: RE | Admit: 2013-03-24 | Discharge: 2013-03-24 | Disposition: A | Discharge status: Home / Self Care | Payer: PRIVATE HEALTH INSURANCE | Source: Ambulatory Visit | Attending: Gastroenterology | Admitting: Gastroenterology

## 2013-03-24 DIAGNOSIS — R1012 Left upper quadrant pain: Secondary | ICD-10-CM

## 2013-03-24 MED ORDER — IOHEXOL 300 MG/ML  SOLN
100.0000 mL | Freq: Once | INTRAMUSCULAR | Status: AC | PRN
  Administered 2013-03-24: 100 mL via INTRAVENOUS

## 2013-05-07 ENCOUNTER — Other Ambulatory Visit: Payer: Self-pay | Admitting: Family Medicine

## 2013-05-21 ENCOUNTER — Encounter: Payer: Self-pay | Admitting: Family Medicine

## 2013-05-21 ENCOUNTER — Telehealth: Payer: Self-pay

## 2013-05-21 ENCOUNTER — Ambulatory Visit (INDEPENDENT_AMBULATORY_CARE_PROVIDER_SITE_OTHER): Payer: PRIVATE HEALTH INSURANCE | Admitting: Family Medicine

## 2013-05-21 VITALS — BP 126/71 | HR 79 | Resp 18 | Ht 60.5 in | Wt 155.0 lb

## 2013-05-21 DIAGNOSIS — E785 Hyperlipidemia, unspecified: Secondary | ICD-10-CM

## 2013-05-21 DIAGNOSIS — L989 Disorder of the skin and subcutaneous tissue, unspecified: Secondary | ICD-10-CM

## 2013-05-21 DIAGNOSIS — E039 Hypothyroidism, unspecified: Secondary | ICD-10-CM

## 2013-05-21 DIAGNOSIS — R7989 Other specified abnormal findings of blood chemistry: Secondary | ICD-10-CM

## 2013-05-21 DIAGNOSIS — Z0000 Encounter for general adult medical examination without abnormal findings: Secondary | ICD-10-CM

## 2013-05-21 DIAGNOSIS — Z139 Encounter for screening, unspecified: Secondary | ICD-10-CM

## 2013-05-21 DIAGNOSIS — E1149 Type 2 diabetes mellitus with other diabetic neurological complication: Secondary | ICD-10-CM

## 2013-05-21 LAB — CBC WITH DIFFERENTIAL/PLATELET
Basophils Absolute: 0 10*3/uL (ref 0.0–0.1)
Basophils Relative: 1 % (ref 0–1)
Eosinophils Absolute: 0.1 10*3/uL (ref 0.0–0.7)
Eosinophils Relative: 2 % (ref 0–5)
HCT: 41.6 % (ref 36.0–46.0)
Lymphocytes Relative: 30 % (ref 12–46)
MCH: 26.2 pg (ref 26.0–34.0)
MCHC: 33.4 g/dL (ref 30.0–36.0)
MCV: 78.3 fL (ref 78.0–100.0)
Monocytes Absolute: 0.3 10*3/uL (ref 0.1–1.0)
Platelets: 222 10*3/uL (ref 150–400)
RDW: 14.5 % (ref 11.5–15.5)

## 2013-05-21 LAB — POCT URINALYSIS DIPSTICK
Bilirubin, UA: NEGATIVE
Blood, UA: NEGATIVE
Ketones, UA: NEGATIVE
Protein, UA: NEGATIVE
pH, UA: 6.5

## 2013-05-21 LAB — BASIC METABOLIC PANEL
BUN: 12 mg/dL (ref 6–23)
CO2: 30 mEq/L (ref 19–32)
Calcium: 9.3 mg/dL (ref 8.4–10.5)
Chloride: 101 mEq/L (ref 96–112)
Creat: 0.75 mg/dL (ref 0.50–1.10)

## 2013-05-21 MED ORDER — ATORVASTATIN CALCIUM 20 MG PO TABS: 20.0000 mg | ORAL_TABLET | Freq: Every day | ORAL | Status: DC

## 2013-05-21 MED ORDER — GABAPENTIN 600 MG PO TABS: 600.0000 mg | ORAL_TABLET | Freq: Four times a day (QID) | ORAL | Status: DC

## 2013-05-21 MED ORDER — METFORMIN HCL ER 500 MG PO TB24: 500.0000 mg | ORAL_TABLET | Freq: Every day | ORAL | Status: DC

## 2013-05-21 MED ORDER — LEVOTHYROXINE SODIUM 75 MCG PO TABS: 75.0000 ug | ORAL_TABLET | Freq: Every day | ORAL | Status: DC

## 2013-05-21 MED ORDER — RAMIPRIL 5 MG PO CAPS: 5.0000 mg | ORAL_CAPSULE | Freq: Every day | ORAL | Status: DC

## 2013-05-21 MED ORDER — ALBUTEROL SULFATE HFA 108 (90 BASE) MCG/ACT IN AERS: 2.0000 | INHALATION_SPRAY | Freq: Four times a day (QID) | RESPIRATORY_TRACT | Status: DC | PRN

## 2013-05-21 MED ORDER — PANTOPRAZOLE SODIUM 20 MG PO TBEC: 20.0000 mg | DELAYED_RELEASE_TABLET | Freq: Every day | ORAL | Status: DC

## 2013-05-21 MED ORDER — FLUTICASONE-SALMETEROL 500-50 MCG/DOSE IN AEPB: 1.0000 | INHALATION_SPRAY | Freq: Two times a day (BID) | RESPIRATORY_TRACT | Status: DC

## 2013-05-21 NOTE — Telephone Encounter (Signed)
She is on protonix  Called to advise her to take protonix. She does not need both. She states it does not help, wants prilosec instead please advise.

## 2013-05-21 NOTE — Progress Notes (Signed)
Subjective:    Patient ID: Mandy Webb, female    DOB: Sep 24, 1946, 67 y.o.   MRN: [id number]  HPI This 67 y.o. Female is here for [facility] Annual Subsequent physical exam. She had an eval in  the [facility] ED in March; diagnosis was constipation which is a chronic problem for this pt. Pt has  hypothyroidism and is complaint with medication. Her GI  physician, Dr. Christella Hartigan, advised she use  Miralax which does provide some relief. CRS -due October 2015.     Dr. Harlon Flor monitors vision and does eye exams every 3-4 months.   Review of Systems  Constitutional: Negative.   HENT: Positive for tinnitus.   Eyes: Negative.   Respiratory: Positive for shortness of breath. Negative for cough, chest tightness and wheezing.   Cardiovascular: Positive for leg swelling. Negative for chest pain and palpitations.  Gastrointestinal: Positive for constipation.  Endocrine: Negative.   Genitourinary: Negative.   Neurological:       Pt has Diabetic peripheral neuropathy.  All other systems reviewed and are negative.       Objective:   Physical Exam  Nursing note and vitals reviewed. Constitutional: She is oriented to person, place, and time. Vital signs are normal. She appears well-developed and well-nourished. No distress.  HENT:  Head: Normocephalic and atraumatic.  Right Ear: Hearing, external ear and ear canal normal. Tympanic membrane is scarred.  Left Ear: Hearing, external ear and ear canal normal. Tympanic membrane is scarred.  Nose: Nose normal. No mucosal edema, nasal deformity or septal deviation.  Mouth/Throat: Uvula is midline, oropharynx is clear and moist and mucous membranes are normal. No oral lesions. Normal dentition.  Eyes: Conjunctivae, EOM and lids are normal. Pupils are equal, round, and reactive to light. No scleral icterus.  Fundoscopic exam:      The right eye shows no papilledema. The right eye shows red reflex.       The left eye shows no papilledema. The left eye shows red  reflex.  Routine exams w/ ophthalmologist every 3-4 months.  Neck: Normal range of motion. Neck supple. No JVD present. No thyromegaly present.  Cardiovascular: Normal rate, regular rhythm, normal heart sounds and intact distal pulses.  Exam reveals no gallop and no friction rub.   No murmur heard. Pulmonary/Chest: Effort normal and breath sounds normal. No respiratory distress. She has no wheezes. She has no rales.  Abdominal: Soft. Normal appearance and bowel sounds are normal. She exhibits no distension, no abdominal bruit, no pulsatile midline mass and no mass. There is no hepatosplenomegaly. There is tenderness in the left lower quadrant. There is no rigidity, no rebound, no guarding and no CVA tenderness. No hernia.  Genitourinary: Rectum normal. Rectal exam shows no external hemorrhoid, no fissure, no mass, no tenderness and anal tone normal. Guaiac negative stool. No breast swelling, tenderness, discharge or bleeding.  NEFG; no PAP or pelvic performed.  Musculoskeletal: Normal range of motion. She exhibits no edema and no tenderness.  Mild deg changes in hands and knees  Lymphadenopathy:    She has no cervical adenopathy.  Neurological: She is alert and oriented to person, place, and time. She has normal reflexes. No cranial nerve deficit. She exhibits normal muscle tone. Coordination normal.  See Diabetic foot exam.  Skin: Skin is warm and dry. No rash noted. She is not diaphoretic. No erythema. No pallor.  R leg- 3 distinct lesions on lower leg. 5 mm raised hyperpigmented lesion along w/ 2 flat lesions.  Psychiatric: She has  a normal mood and affect. Her behavior is normal. Judgment and thought content normal.     BECK SCALE: score=1 Health Risk Assessment for Home Safety completed- pt at low risk for accidents.  Results for orders placed in visit on 05/21/13  POCT URINALYSIS DIPSTICK      Result Value Range   Color, UA yellow     Clarity, UA clear     Glucose, UA neg      Bilirubin, UA neg     Ketones, UA neg     Spec Grav, UA 1.010     Blood, UA neg     pH, UA 6.5     Protein, UA neg     Urobilinogen, UA 4.0     Nitrite, UA neg     Leukocytes, UA small (1+)    POCT GLYCOSYLATED HEMOGLOBIN (HGB A1C)      Result Value Range   Hemoglobin A1C 5.7    IFOBT (OCCULT BLOOD)      Result Value Range   IFOBT Negative         Assessment & Plan:  Routine general medical examination at a health care facility   Unspecified hypothyroidism - Continue current dose of Levothyroxine pending labs- TSH, Free T4  Type II or unspecified type diabetes mellitus with neurological manifestations, not stated as uncontrolled(250.60) - Plan: Basic metabolic panel. Disease is well controlled; continue current medications.  Skin lesion of right leg - Plan: Ambulatory referral to Dermatology for biopsy.  Abnormal CBC - Plan: CBC with Differential (see CBC from March 2014).  Hyperlipidemia - Plan: atorvastatin (LIPITOR) 20 MG tablet  Meds ordered this encounter  Medications  . atorvastatin (LIPITOR) 20 MG tablet    Sig: Take 1 tablet (20 mg total) by mouth daily.    Dispense:  90 tablet    Refill:  3  . Fluticasone-Salmeterol (ADVAIR) 500-50 MCG/DOSE AEPB    Sig: Inhale 1 puff into the lungs every 12 (twelve) hours.    Dispense:  60 each    Refill:  11  . gabapentin (NEURONTIN) 600 MG tablet    Sig: Take 1 tablet (600 mg total) by mouth 4 (four) times daily.    Dispense:  360 tablet    Refill:  3  . levothyroxine (SYNTHROID, LEVOTHROID) 75 MCG tablet    Sig: Take 1 tablet (75 mcg total) by mouth daily.    Dispense:  90 tablet    Refill:  3  . metFORMIN (GLUCOPHAGE XR) 500 MG 24 hr tablet    Sig: Take 1 tablet (500 mg total) by mouth daily with breakfast.    Dispense:  90 tablet    Refill:  3  . ramipril (ALTACE) 5 MG capsule    Sig: Take 1 capsule (5 mg total) by mouth daily.    Dispense:  90 capsule    Refill:  3  . pantoprazole (PROTONIX) 20 MG tablet     Sig: Take 1 tablet (20 mg total) by mouth daily.    Dispense:  90 tablet    Refill:  3  . albuterol (PROVENTIL HFA;VENTOLIN HFA) 108 (90 BASE) MCG/ACT inhaler    Sig: Inhale 2 puffs into the lungs every 6 (six) hours as needed for wheezing (cough, shortness of breath or wheezing.).    Dispense:  1 Inhaler    Refill:  5    PLEASE FILL WITH PRO-AIR INHALER

## 2013-05-21 NOTE — Telephone Encounter (Signed)
Patient was seen today. Wants a prescription for Prilosex to help with acid reflux. [phone]

## 2013-05-21 NOTE — Patient Instructions (Signed)
I have ordered a DERMATOLOGY referral to evaluate the lesions on your right leg. Someone on staff will call you with your appointment which will be after June 20, 2013. I have asked that you be scheduled with Dr. Janalyn Harder.  I have refilled all your medications for 1 year. If any medications need to be changed after your labs are back, you will be notified.

## 2013-05-22 MED ORDER — OMEPRAZOLE 20 MG PO CPDR: 20.0000 mg | DELAYED_RELEASE_CAPSULE | Freq: Every day | ORAL | Status: DC

## 2013-05-22 NOTE — Telephone Encounter (Signed)
Please advise pt that generic Prilosec (omeprazole) 20 mg 1 cap daily has been e-prescribed to her pharmacy; it is a 30-day supply with 5 refills.

## 2013-05-22 NOTE — Progress Notes (Signed)
Quick Note:  Please notify pt that results are normal.   Provide pt with copy of labs. ______ 

## 2013-05-22 NOTE — Telephone Encounter (Signed)
She is advised.

## 2013-05-23 ENCOUNTER — Encounter: Payer: Self-pay | Admitting: *Deleted

## 2013-07-29 ENCOUNTER — Encounter: Payer: Self-pay | Admitting: Internal Medicine

## 2013-08-24 ENCOUNTER — Other Ambulatory Visit: Payer: Self-pay | Admitting: Family Medicine

## 2013-08-24 NOTE — Telephone Encounter (Signed)
Please clarify how this patient takes Reglan (Rx instructions read "Take one tablet by mouth.") TID? QD?

## 2013-08-25 NOTE — Telephone Encounter (Signed)
Dr Christella Hartigan Rxd this 03/19/13 for 1 year, shouldn't need any. Will deny w/note to pharmacy.

## 2013-09-03 ENCOUNTER — Other Ambulatory Visit: Payer: Self-pay | Admitting: Family Medicine

## 2013-09-04 ENCOUNTER — Telehealth: Payer: Self-pay

## 2013-09-04 NOTE — Telephone Encounter (Signed)
Patient is out of Reglan. States she called her pharmacy,[facility] on [address], and they told her she does not have anymore refills left. Patient states she only has 3 pills left. [phone]

## 2013-09-04 NOTE — Telephone Encounter (Signed)
This was given to her by Dr Christella Hartigan

## 2013-09-04 NOTE — Telephone Encounter (Signed)
Spoke with pt advised to request refill from Dr Christella Hartigan. Pt understood and will call tomorrow.

## 2013-09-10 ENCOUNTER — Telehealth: Payer: Self-pay | Admitting: Gastroenterology

## 2013-09-10 MED ORDER — METOCLOPRAMIDE HCL 10 MG PO TABS: 10.0000 mg | ORAL_TABLET | Freq: Every day | ORAL | Status: DC

## 2013-09-10 NOTE — Telephone Encounter (Signed)
Medication has been sent to the pharmacy. 

## 2013-09-11 ENCOUNTER — Encounter: Payer: Self-pay | Admitting: Family Medicine

## 2013-09-11 ENCOUNTER — Ambulatory Visit (INDEPENDENT_AMBULATORY_CARE_PROVIDER_SITE_OTHER): Payer: PRIVATE HEALTH INSURANCE | Admitting: Family Medicine

## 2013-09-11 VITALS — BP 120/74 | HR 83 | Temp 98.6°F | Resp 16 | Ht 60.5 in | Wt 155.0 lb

## 2013-09-11 DIAGNOSIS — M5136 Other intervertebral disc degeneration, lumbar region: Secondary | ICD-10-CM

## 2013-09-11 DIAGNOSIS — M5137 Other intervertebral disc degeneration, lumbosacral region: Secondary | ICD-10-CM

## 2013-09-11 DIAGNOSIS — M5126 Other intervertebral disc displacement, lumbar region: Secondary | ICD-10-CM

## 2013-09-11 NOTE — Patient Instructions (Addendum)
Degenerative Disk Disease Degenerative disk disease is a condition caused by the changes that occur in the cushions of the backbone (spinal disks) as you grow older. Spinal disks are soft and compressible disks located between the bones of the spine (vertebrae). They act like shock absorbers. Degenerative disk disease can affect the whole spine. However, the neck and lower back are most commonly affected. Many changes can occur in the spinal disks with aging, such as:  The spinal disks may dry and shrink.  Small tears may occur in the tough, outer covering of the disk (annulus).  The disk space may become smaller due to loss of water.  Abnormal growths in the bone (spurs) may occur. This can put pressure on the nerve roots exiting the spinal canal, causing pain.  The spinal canal may become narrowed. CAUSES  Degenerative disk disease is a condition caused by the changes that occur in the spinal disks with aging. The exact cause is not known, but there is a genetic basis for many patients. Degenerative changes can occur due to loss of fluid in the disk. This makes the disk thinner and reduces the space between the backbones. Small cracks can develop in the outer layer of the disk. This can lead to the breakdown of the disk. You are more likely to get degenerative disk disease if you are overweight. Smoking cigarettes and doing heavy work such as weightlifting can also increase your risk of this condition. Degenerative changes can start after a sudden injury. Growth of bone spurs can compress the nerve roots and cause pain.  SYMPTOMS  The symptoms vary from person to person. Some people may have no pain, while others have severe pain. The pain may be so severe that it can limit your activities. The location of the pain depends on the part of your backbone that is affected. You will have neck or arm pain if a disk in the neck area is affected. You will have pain in your back, buttocks, or legs if a disk  in the lower back is affected. The pain becomes worse while bending, reaching up, or with twisting movements. The pain may start gradually and then get worse as time passes. It may also start after a major or minor injury. You may feel numbness or tingling in the arms or legs.  DIAGNOSIS  Your caregiver will ask you about your symptoms and about activities or habits that may cause the pain. He or she may also ask about any injuries, diseases, or treatments you have had earlier. Your caregiver will examine you to check for the range of movement that is possible in the affected area, to check for strength in your extremities, and to check for sensation in the areas of the arms and legs supplied by different nerve roots. An X-ray of the spine may be taken. Your caregiver may suggest other imaging tests, such as magnetic resonance imaging (MRI), if needed.  TREATMENT  Treatment includes rest, modifying your activities, and applying ice and heat. Your caregiver may prescribe medicines to reduce your pain and may ask you to do some exercises to strengthen your back. In some cases, you may need surgery. You and your caregiver will decide on the treatment that is best for you. HOME CARE INSTRUCTIONS   Follow proper lifting and walking techniques as advised by your caregiver.  Maintain good posture.  Exercise regularly as advised.  Perform relaxation exercises.  Change your sitting, standing, and sleeping habits as advised. Change positions   frequently.  Lose weight as advised.  Stop smoking if you smoke.  Wear supportive footwear. SEEK MEDICAL CARE IF:  Your pain does not go away within 1 to 4 weeks. SEEK IMMEDIATE MEDICAL CARE IF:   Your pain is severe.  You notice weakness in your arms, hands, or legs.  You begin to lose control of your bladder or bowel movements. MAKE SURE YOU:   Understand these instructions.  Will watch your condition.  Will get help right away if you are not doing  well or get worse. Document Released: 10/01/2007 Document Revised: 02/26/2012 Document Reviewed: 10/01/2007 [facility] Patient Information 2014 [place], [place].    I will call you with the report of the MRI and we can discuss the next step in your treatment. If you have the name of the specialist you saw before, that would be helpful.

## 2013-09-11 NOTE — Progress Notes (Signed)
S:  This 67 y.o. [place] female is here w/ c/o LBP radiating to both buttocks, increased when arising from seated position and w/ prolonged standing. Pain present since fall ~ 2 yrs ago but now persistent. Pt seen at [facility] and evaluation included MRI Lumbar spine. Pt did recall having that test but not the result. She also recalls seeing a specialist but did not return for follow-up. Pt also reports pain in L upper arm, les so in neck. She has some weakness in arms w/ prolonged use. She denies numbness or loss of use of upper extremities.  Patient Active Problem List   Diagnosis Date Noted  . NEOPLASM UNCERTAIN BEHAVIOR STOMACH INTEST&RECT 07/26/2010  . NONSPECIFIC ABN FINDING RAD & OTH EXAM GI TRACT 07/26/2010  . DYSPEPSIA 05/11/2010  . COLONIC POLYPS, HX OF 03/02/2009  . CONSTIPATION 06/09/2008  . HEPATITIS C 07/29/2007  . PITUITARY ADENOMA 07/29/2007  . HYPOTHYROIDISM NOS 07/29/2007  . DIABETES MELLITUS, TYPE II 07/29/2007  . RESTLESS LEG SYNDROME 07/29/2007  . PERIPHERAL NEUROPATHY 07/29/2007  . HYPERTENSION 07/29/2007  . ASTHMA 07/29/2007  . GERD 07/29/2007   PMHx, Soc Hx and Fam Hx reviewed. Medications reconciled.  ROS: As per HPI.  O: Filed Vitals:   09/11/13 1126  BP: 120/74  Pulse: 83  Temp: 98.6 F (37 C)  Resp: 16   GEN: In NAD; WN,WD. HENT: [place]/AT; EOMI w/ clear conj/sclerae. Otherwise unremarkable. COR: RRR. No edema. LUNGS: Normal resp rate and effort. BACK: Spine- mild kyphosis and scoliosis. Muscle spasms in paravertebral muscles. Point tenderness over lower lumbar region. Decreased ROM. NEURO: A&O x 3; Cns intact. Motor function- pt cannot walk on toes or heels. Strength- 3+/5 in lower ext.  MRI (10/23/2011): Impression- No significant focal stenosis to explain symptoms; mild anterolisthesis at L4-5 w/ some uncovering of the disc and facet hypertrophy but no focal stenosis; minimal narrowing of the foramina bilaterally at L5-S1 is unlikely to be  significant.  I reviewed the actual MRI images with the pt.  A/P: DDD (degenerative disc disease), lumbar - Plan: MR Lumbar Spine Wo Contrast  Bulging lumbar disc - Plan: MR Lumbar Spine Wo Contrast; pt was seen by Dr. Ethelene Hal and will probably need to return to that specialist for further treatment. This was discussed with pt.  Continue current medications.

## 2013-09-15 ENCOUNTER — Encounter: Payer: Self-pay | Admitting: Family Medicine

## 2013-09-18 ENCOUNTER — Telehealth: Payer: Self-pay

## 2013-09-18 NOTE — Telephone Encounter (Signed)
Can you check on this for her? 

## 2013-09-18 NOTE — Telephone Encounter (Signed)
Patient states that she is supposed to have MRI scheduled and has not heard anything yet.  [phone]

## 2013-09-22 ENCOUNTER — Other Ambulatory Visit: Payer: Self-pay | Admitting: Physician Assistant

## 2013-09-23 ENCOUNTER — Telehealth: Payer: Self-pay

## 2013-09-23 NOTE — Telephone Encounter (Signed)
Pt states that she was supposed to have a referral for an MRI on her back but has not heard anything from our office as of yet. Best#[phone]

## 2013-09-23 NOTE — Telephone Encounter (Signed)
Will you please check on the MRI referral?

## 2013-09-24 ENCOUNTER — Telehealth: Payer: Self-pay | Admitting: Radiology

## 2013-09-24 NOTE — Telephone Encounter (Signed)
Patient wants to know status of MRI scan please advise. [phone]

## 2013-09-28 ENCOUNTER — Ambulatory Visit
Admission: RE | Admit: 2013-09-28 | Discharge: 2013-09-28 | Disposition: A | Discharge status: Home / Self Care | Payer: PRIVATE HEALTH INSURANCE | Source: Ambulatory Visit | Attending: Family Medicine | Admitting: Family Medicine

## 2013-09-28 DIAGNOSIS — M5126 Other intervertebral disc displacement, lumbar region: Secondary | ICD-10-CM

## 2013-09-28 DIAGNOSIS — M5136 Other intervertebral disc degeneration, lumbar region: Secondary | ICD-10-CM

## 2013-09-29 ENCOUNTER — Encounter: Payer: Self-pay | Admitting: Gastroenterology

## 2013-09-29 ENCOUNTER — Ambulatory Visit (INDEPENDENT_AMBULATORY_CARE_PROVIDER_SITE_OTHER): Payer: PRIVATE HEALTH INSURANCE | Admitting: Gastroenterology

## 2013-09-29 ENCOUNTER — Telehealth: Payer: Self-pay

## 2013-09-29 VITALS — BP 110/62 | HR 89 | Ht 65.0 in | Wt 155.5 lb

## 2013-09-29 DIAGNOSIS — R1012 Left upper quadrant pain: Secondary | ICD-10-CM

## 2013-09-29 DIAGNOSIS — K5900 Constipation, unspecified: Secondary | ICD-10-CM

## 2013-09-29 MED ORDER — LINACLOTIDE 145 MCG PO CAPS: 145.0000 ug | ORAL_CAPSULE | Freq: Every day | ORAL | Status: DC

## 2013-09-29 NOTE — Patient Instructions (Signed)
Trial of linzess, one pill once a day, to help with your constipation.  Samples given, call in 2 weeks and if it helps then a prescription will be called in. While you are trying linzess, you should STOP the miralax.

## 2013-09-29 NOTE — Telephone Encounter (Signed)
IMPRESSION:  Mild progression of degenerative changes L3-4 through L5-S1 without  significant spinal stenosis or nerve root compression as detailed  above.   Arthritis changes but no nerve compression. Called patient and she will call me back.

## 2013-09-29 NOTE — Telephone Encounter (Signed)
Patient is calling to request results of her MRI

## 2013-09-29 NOTE — Progress Notes (Signed)
Review of pertinent gastrointestinal problems:  1. History of colon polyps/colonoscopy 2005 in [place] (7mm polyp),Colonoscopy 2010 found no polyps or cancers, positive melanosis throughout colon. ++ Family history for colon cancer. Surveillance colonoscopy 2015.  2. Questionable history of stomach polyps or Barrett's. These were by endoscopies in [place]. Repeat esophagogastroduodenoscopy by Dr. Christella Hartigan January 2007 showed normal upper gastrointestinal evaluation. No Barrett's, no polyps.  3. Chronic constipation. Well treated with a variety of agents, including an herbal medicine that she takes, aloe vera, Miralax p.r.n.  4. intermitent abd pains: CT 08/2008 essentially negative, felt this was from chronic constipation. Ultrasound abdomen on November 16, 2009, showed diffuse fatty infiltration of the liver, otherwise unremarkable. HIDA scan on December, 2010, showed normal gallbladder ejection fraction, otherwise an unremarkable study.  CT scan 03/2013: mild constipation, otherwise normal. 5. abnormal gastric biopsies, June 2011. Moderate to severe gastritis noted. Biopsies taken. No H. pylori however pathology suggested possible carcinoid reaction. July 2011; August, 2011 repeat EGD performed; pan gastritis, pathology showed gastritis EC hyperplasia, NE features. Octreotide scan normal. 2011, intrinsic factor antibodies negative. EGD 07/2012 ([facility]) 1) Small hiatal hernia 2) Moderate pan gastritis. Biopsies taken 3) Otherwise normal examination. Biopsies showed no IM, only mild chronic gastritis without H. Pylori.   HPI: This is a  very pleasant 67 year old woman whom I last saw about 6 months ago.  She seems sedated today in the office.  She has low back pains.  Same complaints of LUQ pains, chronic for years without clear diagnosis despite many tests.  She takes miralax daily and has diarrhea stools.  So she has changed to every other day and this helps but she has to strain a lot.  Has been  having low back pains.    Past Medical History  Diagnosis Date  . Diabetes mellitus   . Hyperlipidemia   . Hypertension   . Allergy   . Asthma   . Arthritis     Past Surgical History  Procedure Laterality Date  . Appendectomy    . Pituitary surgery    . Tubal ligation    . Abdominal hysterectomy      Current Outpatient Prescriptions  Medication Sig Dispense Refill  . albuterol (PROVENTIL HFA;VENTOLIN HFA) 108 (90 BASE) MCG/ACT inhaler Inhale 2 puffs into the lungs every 6 (six) hours as needed for wheezing (cough, shortness of breath or wheezing.).  1 Inhaler  5  . atorvastatin (LIPITOR) 20 MG tablet Take 1 tablet (20 mg total) by mouth daily.  90 tablet  3  . Blood Glucose Monitoring Suppl (BLOOD GLUCOSE METER) kit Use as instructed  1 each  0  . brimonidine (ALPHAGAN P) 0.1 % SOLN Place 1 drop into both eyes 3 (three) times daily.      . brinzolamide (AZOPT) 1 % ophthalmic suspension 1 drop 3 (three) times daily.      . carboxymethylcellulose (REFRESH PLUS) 0.5 % SOLN 1 drop 4 (four) times daily.      . fish oil-omega-3 fatty acids 1000 MG capsule Take 2 g by mouth daily as needed (for vitamin).       . Fluticasone-Salmeterol (ADVAIR) 500-50 MCG/DOSE AEPB Inhale 1 puff into the lungs every 12 (twelve) hours.  60 each  11  . gabapentin (NEURONTIN) 600 MG tablet Take 1 tablet (600 mg total) by mouth 4 (four) times daily.  360 tablet  3  . glucose blood test strip Use as instructed  100 each  12  . Lancets (ONETOUCH ULTRASOFT)  lancets Use as instructed. Dx code 250.00 patient checks blood sugar tid  100 each  12  . levothyroxine (SYNTHROID, LEVOTHROID) 75 MCG tablet Take 1 tablet (75 mcg total) by mouth daily.  90 tablet  3  . metFORMIN (GLUCOPHAGE XR) 500 MG 24 hr tablet Take 1 tablet (500 mg total) by mouth daily with breakfast.  90 tablet  3  . metoCLOPramide (REGLAN) 10 MG tablet Take 1 tablet (10 mg total) by mouth daily.  90 tablet  3  . montelukast (SINGULAIR) 10 MG tablet  TAKE ONE TABLET BY MOUTH ONCE DAILY AT BEDTIME  90 tablet  0  . Multiple Vitamins-Minerals (MULTIVITAMIN WITH MINERALS) tablet Take 1 tablet by mouth daily. One Source Advanced Adult Multi withD3      . naproxen (NAPROSYN) 500 MG tablet Take 1 tablet (500 mg total) by mouth 2 (two) times daily with a meal.  60 tablet  3  . omeprazole (PRILOSEC) 20 MG capsule Take 1 capsule (20 mg total) by mouth daily.  30 capsule  5  . polyethylene glycol powder (GLYCOLAX/MIRALAX) powder Take 17 g by mouth daily as needed (fror constipation). Take as directed by Dr. Christella Hartigan  257 g  11  . ramipril (ALTACE) 5 MG capsule Take 1 capsule (5 mg total) by mouth daily.  90 capsule  3  . triamcinolone (KENALOG) 0.025 % cream Apply 1 application topically 2 (two) times daily as needed (for skin).        No current facility-administered medications for this visit.    Allergies as of 09/29/2013 - Review Complete 09/29/2013  Allergen Reaction Noted  . Protonix [pantoprazole sodium]  05/21/2013    Family History  Problem Relation Age of Onset  . Stomach cancer Mother     History   Social History  . Marital Status: Single    Spouse Name: N/A    Number of Children: 3  . Years of Education: N/A   Occupational History  . Retired    Social History Main Topics  . Smoking status: Former Smoker    Quit date: 02/02/1969  . Smokeless tobacco: Never Used  . Alcohol Use: No  . Drug Use: No  . Sexual Activity: Not on file   Other Topics Concern  . Not on file   Social History Narrative  . No narrative on file      Physical Exam: BP 110/62  Pulse 89  Ht 5\' 5"  (1.651 m)  Wt 155 lb 8 oz (70.534 kg)  BMI 25.88 kg/m2  SpO2 95% Constitutional: generally well-appearing Psychiatric: alert and oriented x3 Abdomen: soft, nontender, nondistended, no obvious ascites, no peritoneal signs, normal bowel sounds     Assessment and plan: 67 y.o. female with chronic abdominal discomforts, left upper quadrant,  chronic mild constipation  She has her usual GI complaints today. I explained to her that the left upper quadrant pain has been present for many years and eluded diagnosis despite numerous testing. I told her this is almost certainly nothing serious. Perhaps it is related to her mild chronic constipation. She is going to try linzess samples and we'll call to report on her symptoms in 2 weeks.

## 2013-09-30 ENCOUNTER — Ambulatory Visit: Payer: Medicaid Other

## 2013-10-02 NOTE — Progress Notes (Signed)
Quick Note:  I phoned pt to discuss results; left a message for her to call back to discuss details. Mandy Webb or myself can give the results to her. ______

## 2013-10-02 NOTE — Telephone Encounter (Signed)
I have left 2 messages for her to call me back, but she has not returned my calls. Have you had any contact with her?

## 2013-10-02 NOTE — Telephone Encounter (Addendum)
Phone call- left message notifying pt that there were no new findings on MRI but would like to discuss details w/ her. Advised that she ask for Amy Littrell or myself when she calls back.

## 2013-10-03 ENCOUNTER — Telehealth: Payer: Self-pay | Admitting: Radiology

## 2013-10-03 NOTE — Progress Notes (Signed)
Quick Note:  Your recent imaging study shows chronic arthritis and disc disease in the lower spine. It has not changed significantly since last MRI almost 2 years ago.  (I called the pt on 10/02/13 and left her a massage). ______

## 2013-10-03 NOTE — Telephone Encounter (Signed)
Patient advised of MRI results. She will Dr Audria Nine next week.

## 2013-10-08 ENCOUNTER — Encounter: Payer: Self-pay | Admitting: Podiatry

## 2013-10-08 ENCOUNTER — Ambulatory Visit (INDEPENDENT_AMBULATORY_CARE_PROVIDER_SITE_OTHER): Payer: PRIVATE HEALTH INSURANCE | Admitting: Podiatry

## 2013-10-08 VITALS — BP 132/86 | HR 91 | Resp 12 | Ht 63.0 in | Wt 155.0 lb

## 2013-10-08 DIAGNOSIS — B351 Tinea unguium: Secondary | ICD-10-CM

## 2013-10-08 DIAGNOSIS — M79609 Pain in unspecified limb: Secondary | ICD-10-CM

## 2013-10-08 NOTE — Progress Notes (Signed)
Patient ID: Mandy Webb, female   DOB: [birth year], 67 y.o.   MRN: [id number]   Subjective: This 67 year old diabetic female resents for ongoing debridement of painful toenails.  Objective: Hypertrophic elongated toenails with palpable tenderness in all nail plates.  Assessment: Symptomatic onychomycosis x10  Plan: All 10 toenails were debrided today without any bleeding, and returned intervals recommended at 3 months.  Mandy Webb C.Leeanne Deed, DPM

## 2013-10-10 ENCOUNTER — Telehealth: Payer: Self-pay | Admitting: Gastroenterology

## 2013-10-10 MED ORDER — LINACLOTIDE 145 MCG PO CAPS: 145.0000 ug | ORAL_CAPSULE | Freq: Every day | ORAL | Status: DC

## 2013-10-10 NOTE — Telephone Encounter (Signed)
rx has been sent per pt request 

## 2013-10-14 ENCOUNTER — Ambulatory Visit (INDEPENDENT_AMBULATORY_CARE_PROVIDER_SITE_OTHER): Payer: PRIVATE HEALTH INSURANCE | Admitting: Family Medicine

## 2013-10-14 ENCOUNTER — Encounter: Payer: Self-pay | Admitting: Family Medicine

## 2013-10-14 VITALS — BP 148/78 | HR 73 | Temp 98.4°F | Resp 16 | Ht 59.5 in | Wt 155.4 lb

## 2013-10-14 DIAGNOSIS — E119 Type 2 diabetes mellitus without complications: Secondary | ICD-10-CM

## 2013-10-14 DIAGNOSIS — M5137 Other intervertebral disc degeneration, lumbosacral region: Secondary | ICD-10-CM

## 2013-10-14 NOTE — Patient Instructions (Signed)

## 2013-10-15 NOTE — Progress Notes (Signed)
S:  This 67 y.o. [place] female has DDD of lumbosacral spine and returns today to review results of MRI. She continues to have LBP w/ stiffness and difficulty arising from a chair after prolonged sitting; morning stiffness eases off after she is up and about for 1-2 hours. Current medications are minimally effective. Pt has not fallen or had significant numbness or weakness in her lower extremities.She ambulates w/ a cane.  Pt maintains excellent control of Diabetes; reviewed last 3 A1c values w/ pt (all < 6.0%). Pt has no hypoglycemia or medication side effects.   PMHx, Soc Hx and Fam Hx reviewed. Problem List and medications reconciled.  ROS: As per HPI; negative for fever/chills, fatigue, Cp or palpitations, SOB or DOE, GI problems/ change in bowel habits or voiding, myalgias or other arthralgias except for hips/knees.  O: Filed Vitals:   10/14/13 1524  BP: 148/78  Pulse: 73  Temp: 98.4 F (36.9 C)  Resp: 16   GEN: in NAD: WN,WD. Truncal obesity noted. BACK: Spine is straight but tender w/ muscle spasms in lumbar region. Tender SI joints w/ moderate palpation.  MS: Gait is antalgic. NEURO: A&O x 3; CNs intact. Motor function (lower ext)- 3+-4/5. Sensory intact.   MRI (09/11/13): Mild progression of degenerative changes L3-4 through L5-S1 w/o significant spinal stenosis or nerve root compression. Note- L4-5: moderate facet joint deg changes; 3 mm anterior slippage of L4. Bulge w/o significant stenosis or foraminal narrowing.  MRI images reviewed with pt.  A/P: DDD (degenerative disc disease), lumbosacral - Discussed treatment options w/ pt; she would like to ry PT first.  If no improvement, will refer to Neurosurgeon.     Plan: Ambulatory referral to Physical Therapy  DIABETES MELLITUS, TYPE II- Stable and well controlled.

## 2013-11-02 ENCOUNTER — Other Ambulatory Visit: Payer: Self-pay | Admitting: Family Medicine

## 2013-11-24 ENCOUNTER — Other Ambulatory Visit: Payer: Self-pay | Admitting: Family Medicine

## 2014-01-06 ENCOUNTER — Ambulatory Visit: Payer: PRIVATE HEALTH INSURANCE

## 2014-01-15 ENCOUNTER — Encounter: Payer: Self-pay | Admitting: Family Medicine

## 2014-01-15 ENCOUNTER — Ambulatory Visit (INDEPENDENT_AMBULATORY_CARE_PROVIDER_SITE_OTHER): Payer: PRIVATE HEALTH INSURANCE | Admitting: Family Medicine

## 2014-01-15 VITALS — BP 120/74 | HR 78 | Temp 98.2°F | Resp 16 | Ht 59.0 in | Wt 155.8 lb

## 2014-01-15 DIAGNOSIS — R42 Dizziness and giddiness: Secondary | ICD-10-CM

## 2014-01-15 DIAGNOSIS — I10 Essential (primary) hypertension: Secondary | ICD-10-CM

## 2014-01-15 DIAGNOSIS — Q809 Congenital ichthyosis, unspecified: Secondary | ICD-10-CM

## 2014-01-15 DIAGNOSIS — E119 Type 2 diabetes mellitus without complications: Secondary | ICD-10-CM

## 2014-01-15 DIAGNOSIS — Q828 Other specified congenital malformations of skin: Secondary | ICD-10-CM

## 2014-01-15 DIAGNOSIS — E039 Hypothyroidism, unspecified: Secondary | ICD-10-CM

## 2014-01-15 LAB — COMPLETE METABOLIC PANEL WITH GFR
ALK PHOS: 48 U/L (ref 39–117)
ALT: 13 U/L (ref 0–35)
AST: 21 U/L (ref 0–37)
Albumin: 4.2 g/dL (ref 3.5–5.2)
BUN: 13 mg/dL (ref 6–23)
CO2: 29 mEq/L (ref 19–32)
CREATININE: 0.75 mg/dL (ref 0.50–1.10)
Calcium: 9.4 mg/dL (ref 8.4–10.5)
Chloride: 101 mEq/L (ref 96–112)
GFR, EST NON AFRICAN AMERICAN: 82 mL/min
GFR, Est African American: >89 mL/min
Glucose, Bld: 107 mg/dL — ABNORMAL HIGH (ref 70–99)
POTASSIUM: 4.2 meq/L (ref 3.5–5.3)
Sodium: 141 mEq/L (ref 135–145)
Total Bilirubin: 0.5 mg/dL (ref 0.2–1.2)
Total Protein: 7.5 g/dL (ref 6.0–8.3)

## 2014-01-15 LAB — POCT GLYCOSYLATED HEMOGLOBIN (HGB A1C): HEMOGLOBIN A1C: 5.8

## 2014-01-15 LAB — T3, FREE: T3, Free: 2.2 pg/mL — ABNORMAL LOW (ref 2.3–4.2)

## 2014-01-15 LAB — TSH: TSH: 1.206 u[IU]/mL (ref 0.350–4.500)

## 2014-01-15 MED ORDER — AMMONIUM LACTATE 12 % EX LOTN: 1.0000 "application " | TOPICAL_LOTION | CUTANEOUS | Status: DC | PRN

## 2014-01-15 NOTE — Patient Instructions (Addendum)
Blood pressure medication- You are taking Ramipril 5 mg  1 tablet daily.  You will return to clinic in 6 weeks for BP recheck. Also, you need to be sure that you are staying well hydrated. Older people sometimes do not drink enough fluids in the winter.

## 2014-01-15 NOTE — Progress Notes (Signed)
S: This 68 y.o. [place] female is here for Type II DM and HTN follow-up. She is compliant w/ self-care plan, checking FSBS and follows a meal plan most days of the week . She denies hypoglycemia. BP is stable and well controlled w/ adverse medication effects. Pt does not report fatigue, CP or tightness, palpitations, SOB or DOE, edema or HA.  Pt c/o episodic lightheadedness, not related to activity. She has no vision disturbances or unilateral numbness or facial asymmetry of slsurred speech. She reports adequate hydration.  Pt is complaint w/ thyroid medication; she reports excessively dry skin despite using a moisturizer.  Patient Active Problem List   Diagnosis Date Noted  . NEOPLASM UNCERTAIN BEHAVIOR STOMACH INTEST&RECT 07/26/2010  . NONSPECIFIC ABN FINDING RAD & OTH EXAM GI TRACT 07/26/2010  . DYSPEPSIA 05/11/2010  . COLONIC POLYPS, HX OF 03/02/2009  . CONSTIPATION 06/09/2008  . HEPATITIS C 07/29/2007  . PITUITARY ADENOMA 07/29/2007  . HYPOTHYROIDISM NOS 07/29/2007  . DIABETES MELLITUS, TYPE II 07/29/2007  . RESTLESS LEG SYNDROME 07/29/2007  . PERIPHERAL NEUROPATHY 07/29/2007  . HYPERTENSION 07/29/2007  . ASTHMA 07/29/2007  . GERD 07/29/2007   Prior to Admission medications   Medication Sig Start Date End Date Taking? Authorizing Provider  albuterol (PROVENTIL HFA;VENTOLIN HFA) 108 (90 BASE) MCG/ACT inhaler Inhale 2 puffs into the lungs every 6 (six) hours as needed for wheezing (cough, shortness of breath or wheezing.). 05/21/13  Yes Barton Fanny, MD  atorvastatin (LIPITOR) 20 MG tablet Take 1 tablet (20 mg total) by mouth daily. 05/21/13  Yes Barton Fanny, MD  Blood Glucose Monitoring Suppl (BLOOD GLUCOSE METER) kit Use as instructed 11/06/12  Yes Heather M Marte, PA-C  brinzolamide (AZOPT) 1 % ophthalmic suspension 1 drop 3 (three) times daily.   Yes Historical Provider, MD  carboxymethylcellulose (REFRESH PLUS) 0.5 % SOLN 1 drop 4 (four) times daily.   Yes Historical  Provider, MD  fish oil-omega-3 fatty acids 1000 MG capsule Take 2 g by mouth daily as needed (for vitamin).    Yes Historical Provider, MD  Fluticasone-Salmeterol (ADVAIR) 500-50 MCG/DOSE AEPB Inhale 1 puff into the lungs every 12 (twelve) hours. 05/21/13  Yes Barton Fanny, MD  gabapentin (NEURONTIN) 600 MG tablet Take 1 tablet (600 mg total) by mouth 4 (four) times daily. 05/21/13  Yes Barton Fanny, MD  glucose blood test strip Use as instructed 11/04/12  Yes Eleanore Kurtis Bushman, PA-C  Lancets [facility] ULTRASOFT) lancets Use as instructed. Dx code 250.00 patient checks blood sugar tid 11/06/12  Yes Heather M Marte, PA-C  Linaclotide [facility]) 145 MCG CAPS capsule Take 1 capsule (145 mcg total) by mouth daily. 10/10/13  Yes Milus Banister, MD  metFORMIN (GLUCOPHAGE XR) 500 MG 24 hr tablet Take 1 tablet (500 mg total) by mouth daily with breakfast. 05/21/13  Yes Barton Fanny, MD  metoCLOPramide (REGLAN) 10 MG tablet Take 1 tablet (10 mg total) by mouth daily. 09/10/13  Yes Milus Banister, MD  montelukast (SINGULAIR) 10 MG tablet TAKE ONE TABLET BY MOUTH ONCE DAILY AT BEDTIME 09/22/13  Yes Barton Fanny, MD  montelukast (SINGULAIR) 10 MG tablet TAKE ONE TABLET BY MOUTH AT BEDTIME 11/24/13  Yes Eleanore E Elana Alm, PA-C  Multiple Vitamins-Minerals (MULTIVITAMIN WITH MINERALS) tablet Take 1 tablet by mouth daily. One Source Advanced Adult Multi withD3   Yes Historical Provider, MD  omeprazole (PRILOSEC) 20 MG capsule TAKE ONE CAPSULE BY MOUTH ONCE DAILY 11/02/13  Yes Barton Fanny, MD  ramipril (ALTACE) 5 MG capsule Take 1 capsule (5 mg total) by mouth daily. 05/21/13  Yes Barton Fanny, MD  triamcinolone (KENALOG) 0.025 % cream Apply 1 application topically 2 (two) times daily as needed (for skin).    Yes Historical Provider, MD  ammonium lactate (LAC-HYDRIN) 12 % lotion Apply 1 application topically as needed for dry skin. 01/15/14   Barton Fanny, MD  brimonidine (ALPHAGAN  P) 0.1 % SOLN Place 1 drop into both eyes 3 (three) times daily.    Historical Provider, MD  levothyroxine (SYNTHROID, LEVOTHROID) 88 MCG tablet Take 1 tablet (88 mcg total) by mouth daily. 01/16/14   Barton Fanny, MD  naproxen (NAPROSYN) 500 MG tablet Take 1 tablet (500 mg total) by mouth 2 (two) times daily with a meal. 02/07/13   Barton Fanny, MD    PMHx, Surg Hx, Soc and Fam Hx reviewed.  ROS: As per HPI. Otherwise noncontributory.  O: Filed Vitals:   01/15/14 0844  BP: 120/74  Pulse: 78  Temp: 98.2 F (36.8 C)  Resp: 16   GEN: In NAD; WN,WD. HENT: [place]/AT; EOMI w/ clear conj/sclerae. EACs/TMs normal. Oroph clear and moist. NT sinuses. NECK: Supple w/o LAN or TMG. No carotid bruits. COR: RRR. Normal S1 and S2; no m/g/r. LUNGS: CTA; no wheezes, rhonchi or rales. Unlabored resp. SKIN: W&D; intact but dry. No rashes, erythema, ecchymoses or lesions. See DM Foot exam. MS: MAEs; no major joint deformities or muscle atrophy. NEURO: A&O x 3; CNs intact. Nonfocal.   Results for orders placed in visit on 01/15/14  POCT GLYCOSYLATED HEMOGLOBIN (HGB A1C)      Result Value Range   Hemoglobin A1C 5.8      A/P: Type II or unspecified type diabetes mellitus without mention of complication, not stated as uncontrolled - Stable on current medications; no change at this time. Plan: HM Diabetes Foot Exam, POCT glycosylated hemoglobin (Hb A1C)  HYPERTENSION - Stable and controlled on current medications. Plan: COMPLETE METABOLIC PANEL WITH GFR, Carotid duplex  Episodic lightheadedness - Plan: Carotid duplex  HYPOTHYROIDISM NOS - Continue current medication pending lab results. Plan: TSH, T3, Free  Xeroderma- Discussed skin care regimen; RX: Lac-Hydrin lotion.  Meds ordered this encounter  Medications  . ammonium lactate (LAC-HYDRIN) 12 % lotion    Sig: Apply 1 application topically as needed for dry skin.    Dispense:  400 g    Refill:  1

## 2014-01-16 ENCOUNTER — Other Ambulatory Visit: Payer: Self-pay | Admitting: Family Medicine

## 2014-01-16 MED ORDER — LEVOTHYROXINE SODIUM 88 MCG PO TABS: 88.0000 ug | ORAL_TABLET | Freq: Every day | ORAL | Status: DC

## 2014-01-16 NOTE — Progress Notes (Signed)
Quick Note:  Please advise pt regarding following labs...  All your labs look good. One of the thyroid test tells me that I need to increase your medication just a little. Your current dose is 75 mcg; I am increasing that to 88 mcg.  This new dose is at your pharmacy for pick up; remember to take this medication by itself, first thing in the morning.  Copy to pt. ______

## 2014-01-19 ENCOUNTER — Other Ambulatory Visit: Payer: Self-pay | Admitting: Physician Assistant

## 2014-01-20 ENCOUNTER — Ambulatory Visit (INDEPENDENT_AMBULATORY_CARE_PROVIDER_SITE_OTHER): Payer: PRIVATE HEALTH INSURANCE

## 2014-01-20 VITALS — BP 122/70 | HR 74 | Resp 18

## 2014-01-20 DIAGNOSIS — E1149 Type 2 diabetes mellitus with other diabetic neurological complication: Secondary | ICD-10-CM

## 2014-01-20 DIAGNOSIS — B351 Tinea unguium: Secondary | ICD-10-CM

## 2014-01-20 DIAGNOSIS — E1142 Type 2 diabetes mellitus with diabetic polyneuropathy: Secondary | ICD-10-CM

## 2014-01-20 DIAGNOSIS — E1140 Type 2 diabetes mellitus with diabetic neuropathy, unspecified: Secondary | ICD-10-CM

## 2014-01-20 DIAGNOSIS — M79609 Pain in unspecified limb: Secondary | ICD-10-CM

## 2014-01-20 NOTE — Patient Instructions (Signed)
Diabetes and Foot Care Diabetes may cause you to have problems because of poor blood supply (circulation) to your feet and legs. This may cause the skin on your feet to become thinner, break easier, and heal more slowly. Your skin may become dry, and the skin may peel and crack. You may also have nerve damage in your legs and feet causing decreased feeling in them. You may not notice minor injuries to your feet that could lead to infections or more serious problems. Taking care of your feet is one of the most important things you can do for yourself.  HOME CARE INSTRUCTIONS  Wear shoes at all times, even in the house. Do not go barefoot. Bare feet are easily injured.  Check your feet daily for blisters, cuts, and redness. If you cannot see the bottom of your feet, use a mirror or ask someone for help.  Wash your feet with warm water (do not use hot water) and mild soap. Then pat your feet and the areas between your toes until they are completely dry. Do not soak your feet as this can dry your skin.  Apply a moisturizing lotion or petroleum jelly (that does not contain alcohol and is unscented) to the skin on your feet and to dry, brittle toenails. Do not apply lotion between your toes.  Trim your toenails straight across. Do not dig under them or around the cuticle. File the edges of your nails with an emery board or nail file.  Do not cut corns or calluses or try to remove them with medicine.  Wear clean socks or stockings every day. Make sure they are not too tight. Do not wear knee-high stockings since they may decrease blood flow to your legs.  Wear shoes that fit properly and have enough cushioning. To break in new shoes, wear them for just a few hours a day. This prevents you from injuring your feet. Always look in your shoes before you put them on to be sure there are no objects inside.  Do not cross your legs. This may decrease the blood flow to your feet.  If you find a minor scrape,  cut, or break in the skin on your feet, keep it and the skin around it clean and dry. These areas may be cleansed with mild soap and water. Do not cleanse the area with peroxide, alcohol, or iodine.  When you remove an adhesive bandage, be sure not to damage the skin around it.  If you have a wound, look at it several times a day to make sure it is healing.  Do not use heating pads or hot water bottles. They may burn your skin. If you have lost feeling in your feet or legs, you may not know it is happening until it is too late.  Make sure your health care provider performs a complete foot exam at least annually or more often if you have foot problems. Report any cuts, sores, or bruises to your health care provider immediately. SEEK MEDICAL CARE IF:   You have an injury that is not healing.  You have cuts or breaks in the skin.  You have an ingrown nail.  You notice redness on your legs or feet.  You feel burning or tingling in your legs or feet.  You have pain or cramps in your legs and feet.  Your legs or feet are numb.  Your feet always feel cold. SEEK IMMEDIATE MEDICAL CARE IF:   There is increasing redness,   swelling, or pain in or around a wound.  There is a red line that goes up your leg.  Pus is coming from a wound.  You develop a fever or as directed by your health care provider.  You notice a bad smell coming from an ulcer or wound. Document Released: 12/01/2000 Document Revised: 08/06/2013 Document Reviewed: 05/13/2013 Exit[employer] Patient Information 2014 [employer].  

## 2014-01-20 NOTE — Progress Notes (Signed)
   Subjective:    Patient ID: Gelisa Tieken, female    DOB: 06/08/1946, 68 y.o.   MRN: [id number]  HPI I am here to get my toenails cut on    Review of Systems no new changes or findings     Objective:   Physical Exam Vascular status is intact with pulses palpable DP postal for PT plus one over 4 bilateral Refill time 3 seconds all digits skin color normal there is nails thick hypertrophic friable and criptotic incurvated 1 through 5 bilateral. Patient does have diabetes with complications diminished neurovascular status confirmed on Semmes Weinstein testing to the plantar forefoot and digits DTRs not elicited normal plantar response noted no open wounds ulcerations noted there is some diffuse keratoses and may be a residual verruca sub-first MTP area proximal in sub-fifth MTP area on the left sub-first right sub-fifth left. These have not painful no secondary infection is noted       Assessment & Plan:  Assessment diabetes with complications and peripheral neuropathy. Debridement of mycotic criptotic ingrowing incurvated nails 1 through 5 bilateral return for future palliative care and as-needed basis suggest 3 month followup also debrided a verrucoid type lesions which are none bleeding nonpainful noninfected at this current time contact us if any changes occur at  Harriet Masson DPM

## 2014-01-22 ENCOUNTER — Ambulatory Visit (HOSPITAL_COMMUNITY): Payer: PRIVATE HEALTH INSURANCE | Attending: Cardiology

## 2014-01-22 ENCOUNTER — Encounter: Payer: Self-pay | Admitting: Cardiology

## 2014-01-22 DIAGNOSIS — I10 Essential (primary) hypertension: Secondary | ICD-10-CM | POA: Insufficient documentation

## 2014-01-22 DIAGNOSIS — R42 Dizziness and giddiness: Secondary | ICD-10-CM | POA: Insufficient documentation

## 2014-01-22 DIAGNOSIS — I6529 Occlusion and stenosis of unspecified carotid artery: Secondary | ICD-10-CM | POA: Insufficient documentation

## 2014-01-22 DIAGNOSIS — Z87891 Personal history of nicotine dependence: Secondary | ICD-10-CM | POA: Insufficient documentation

## 2014-01-22 DIAGNOSIS — E119 Type 2 diabetes mellitus without complications: Secondary | ICD-10-CM | POA: Insufficient documentation

## 2014-01-22 DIAGNOSIS — I658 Occlusion and stenosis of other precerebral arteries: Secondary | ICD-10-CM | POA: Insufficient documentation

## 2014-01-23 NOTE — Progress Notes (Signed)
Quick Note:  Your recent study of the blood vessels in your neck (carotid arteries) showed a little plaque but there is no significant blockage in these arteries. The blood flow to the brain is good. ______

## 2014-02-24 ENCOUNTER — Encounter: Payer: Self-pay | Admitting: Gastroenterology

## 2014-03-18 ENCOUNTER — Other Ambulatory Visit: Payer: Self-pay | Admitting: Family Medicine

## 2014-03-19 NOTE — Telephone Encounter (Signed)
Dr Leward Quan, you saw this pt for check up 01/15/14 but I don't see GERD addressed. Can we RF for pt?

## 2014-03-19 NOTE — Telephone Encounter (Signed)
Omeprazole refilled.

## 2014-03-22 ENCOUNTER — Emergency Department (HOSPITAL_COMMUNITY): Payer: PRIVATE HEALTH INSURANCE

## 2014-03-22 ENCOUNTER — Encounter (HOSPITAL_COMMUNITY): Payer: Self-pay | Admitting: Emergency Medicine

## 2014-03-22 ENCOUNTER — Emergency Department (HOSPITAL_COMMUNITY)
Admission: EM | Admit: 2014-03-22 | Discharge: 2014-03-23 | Disposition: A | Discharge status: Home / Self Care | Payer: PRIVATE HEALTH INSURANCE | Attending: Emergency Medicine | Admitting: Emergency Medicine

## 2014-03-22 DIAGNOSIS — J45909 Unspecified asthma, uncomplicated: Secondary | ICD-10-CM | POA: Insufficient documentation

## 2014-03-22 DIAGNOSIS — IMO0002 Reserved for concepts with insufficient information to code with codable children: Secondary | ICD-10-CM | POA: Insufficient documentation

## 2014-03-22 DIAGNOSIS — M129 Arthropathy, unspecified: Secondary | ICD-10-CM | POA: Insufficient documentation

## 2014-03-22 DIAGNOSIS — R51 Headache: Secondary | ICD-10-CM | POA: Insufficient documentation

## 2014-03-22 DIAGNOSIS — I10 Essential (primary) hypertension: Secondary | ICD-10-CM | POA: Insufficient documentation

## 2014-03-22 DIAGNOSIS — R519 Headache, unspecified: Secondary | ICD-10-CM

## 2014-03-22 DIAGNOSIS — E785 Hyperlipidemia, unspecified: Secondary | ICD-10-CM | POA: Insufficient documentation

## 2014-03-22 DIAGNOSIS — Z79899 Other long term (current) drug therapy: Secondary | ICD-10-CM | POA: Insufficient documentation

## 2014-03-22 DIAGNOSIS — Z791 Long term (current) use of non-steroidal anti-inflammatories (NSAID): Secondary | ICD-10-CM | POA: Insufficient documentation

## 2014-03-22 DIAGNOSIS — Z87891 Personal history of nicotine dependence: Secondary | ICD-10-CM | POA: Insufficient documentation

## 2014-03-22 DIAGNOSIS — E119 Type 2 diabetes mellitus without complications: Secondary | ICD-10-CM | POA: Insufficient documentation

## 2014-03-22 LAB — CBC WITH DIFFERENTIAL/PLATELET
Basophils Absolute: 0 K/uL (ref 0.0–0.1)
Basophils Relative: 0 % (ref 0–1)
Eosinophils Absolute: 0.1 K/uL (ref 0.0–0.7)
Eosinophils Relative: 2 % (ref 0–5)
HCT: 43.2 % (ref 36.0–46.0)
Hemoglobin: 14.8 g/dL (ref 12.0–15.0)
Lymphocytes Relative: 34 % (ref 12–46)
Lymphs Abs: 2.3 K/uL (ref 0.7–4.0)
MCH: 27.1 pg (ref 26.0–34.0)
MCHC: 34.3 g/dL (ref 30.0–36.0)
MCV: 79 fL (ref 78.0–100.0)
Monocytes Absolute: 0.4 K/uL (ref 0.1–1.0)
Monocytes Relative: 6 % (ref 3–12)
Neutro Abs: 3.9 K/uL (ref 1.7–7.7)
Neutrophils Relative %: 58 % (ref 43–77)
Platelets: 189 K/uL (ref 150–400)
RBC: 5.47 MIL/uL — ABNORMAL HIGH (ref 3.87–5.11)
RDW: 13.3 % (ref 11.5–15.5)
WBC: 6.7 K/uL (ref 4.0–10.5)

## 2014-03-22 LAB — COMPREHENSIVE METABOLIC PANEL
ALBUMIN: 4.1 g/dL (ref 3.5–5.2)
ALT: 16 U/L (ref 0–35)
AST: 23 U/L (ref 0–37)
Alkaline Phosphatase: 52 U/L (ref 39–117)
BILIRUBIN TOTAL: 0.4 mg/dL (ref 0.3–1.2)
BUN: 11 mg/dL (ref 6–23)
CHLORIDE: 100 meq/L (ref 96–112)
CO2: 30 mEq/L (ref 19–32)
CREATININE: 0.7 mg/dL (ref 0.50–1.10)
Calcium: 9.3 mg/dL (ref 8.4–10.5)
GFR calc Af Amer: >90 mL/min (ref >90)
GFR calc non Af Amer: 86 mL/min — ABNORMAL LOW (ref >90)
Glucose, Bld: 107 mg/dL — ABNORMAL HIGH (ref 70–99)
Potassium: 4.2 mEq/L (ref 3.7–5.3)
Sodium: 143 mEq/L (ref 137–147)
TOTAL PROTEIN: 8.3 g/dL (ref 6.0–8.3)

## 2014-03-22 MED ORDER — ONDANSETRON HCL 4 MG/2ML IJ SOLN
4.0000 mg | Freq: Once | INTRAMUSCULAR | Status: DC
  Filled 2014-03-22: qty 2

## 2014-03-22 MED ORDER — NALOXONE HCL 0.4 MG/ML IJ SOLN
0.4000 mg | Freq: Once | INTRAMUSCULAR | Status: AC
  Administered 2014-03-23: 0.4 mg via INTRAVENOUS
  Filled 2014-03-22: qty 1

## 2014-03-22 MED ORDER — SODIUM CHLORIDE 0.9 % IV BOLUS (SEPSIS)
500.0000 mL | Freq: Once | INTRAVENOUS | Status: AC
  Administered 2014-03-22: 500 mL via INTRAVENOUS

## 2014-03-22 MED ORDER — MORPHINE SULFATE 4 MG/ML IJ SOLN
4.0000 mg | Freq: Once | INTRAMUSCULAR | Status: AC
  Administered 2014-03-22: 4 mg via INTRAVENOUS
  Filled 2014-03-22: qty 1

## 2014-03-22 MED ORDER — KETOROLAC TROMETHAMINE 30 MG/ML IJ SOLN
30.0000 mg | Freq: Once | INTRAMUSCULAR | Status: DC
  Filled 2014-03-22: qty 1

## 2014-03-22 NOTE — ED Provider Notes (Addendum)
TIME SEEN: 8:45 PM  CHIEF COMPLAINT: Headache, hypertension  HPI: Patient is a 68 year old female with a history of hypertension, hyperlipidemia, diabetes, asthma who presents to the emergency department with complaints of gradual onset, diffuse throbbing headache that started this evening. She states that she began having headache and asked her daughter take her blood pressure which was 160/88 at home. She reports she took her blood pressure medications today. When she arrived to the ED her blood pressure was 193/77. She denies any blurry vision or vision loss. No chest pain or shortness of breath. No numbness, tingling or focal weakness. She has had similar headaches in the past. She does not take any medications at home for this. No thunderclap headache or worst headache of her life.  ROS: See HPI Constitutional: no fever  Eyes: no drainage  ENT: no runny nose   Cardiovascular:  no chest pain  Resp: no SOB  GI: no vomiting GU: no dysuria Integumentary: no rash  Allergy: no hives  Musculoskeletal: no leg swelling  Neurological: no slurred speech ROS otherwise negative  PAST MEDICAL HISTORY/PAST SURGICAL HISTORY:  Past Medical History  Diagnosis Date  . Diabetes mellitus   . Hyperlipidemia   . Hypertension   . Allergy   . Asthma   . Arthritis     MEDICATIONS:  Prior to Admission medications   Medication Sig Start Date End Date Taking? Authorizing Provider  albuterol (PROVENTIL HFA;VENTOLIN HFA) 108 (90 BASE) MCG/ACT inhaler Inhale 2 puffs into the lungs every 6 (six) hours as needed for wheezing (cough, shortness of breath or wheezing.). 05/21/13   Barton Fanny, MD  ammonium lactate (LAC-HYDRIN) 12 % lotion Apply 1 application topically as needed for dry skin. 01/15/14   Barton Fanny, MD  atorvastatin (LIPITOR) 20 MG tablet Take 1 tablet (20 mg total) by mouth daily. 05/21/13   Barton Fanny, MD  Blood Glucose Monitoring Suppl (BLOOD GLUCOSE METER) kit Use as  instructed 11/06/12   Collene Leyden, PA-C  brimonidine (ALPHAGAN P) 0.1 % SOLN Place 1 drop into both eyes 3 (three) times daily.    Historical Provider, MD  brinzolamide (AZOPT) 1 % ophthalmic suspension 1 drop 3 (three) times daily.    Historical Provider, MD  carboxymethylcellulose (REFRESH PLUS) 0.5 % SOLN 1 drop 4 (four) times daily.    Historical Provider, MD  fish oil-omega-3 fatty acids 1000 MG capsule Take 2 g by mouth daily as needed (for vitamin).     Historical Provider, MD  Fluticasone-Salmeterol (ADVAIR) 500-50 MCG/DOSE AEPB Inhale 1 puff into the lungs every 12 (twelve) hours. 05/21/13   Barton Fanny, MD  gabapentin (NEURONTIN) 600 MG tablet Take 1 tablet (600 mg total) by mouth 4 (four) times daily. 05/21/13   Barton Fanny, MD  glucose blood (ONE TOUCH ULTRA TEST) test strip Use to test blood sugar daily. Dx code: 250.00 01/19/14   Mancel Bale, PA-C  glucose blood test strip Use as instructed 11/04/12   Theda Sers, PA-C  Lancets [facility] ULTRASOFT) lancets Use as instructed. Dx code 250.00 patient checks blood sugar tid 11/06/12   Collene Leyden, PA-C  levothyroxine (SYNTHROID, LEVOTHROID) 88 MCG tablet Take 1 tablet (88 mcg total) by mouth daily. 01/16/14   Barton Fanny, MD  Linaclotide [facility]) 145 MCG CAPS capsule Take 1 capsule (145 mcg total) by mouth daily. 10/10/13   Milus Banister, MD  metFORMIN (GLUCOPHAGE XR) 500 MG 24 hr tablet Take 1 tablet (500  mg total) by mouth daily with breakfast. 05/21/13   Barton Fanny, MD  metoCLOPramide (REGLAN) 10 MG tablet Take 1 tablet (10 mg total) by mouth daily. 09/10/13   Milus Banister, MD  montelukast (SINGULAIR) 10 MG tablet TAKE ONE TABLET BY MOUTH ONCE DAILY AT BEDTIME 09/22/13   Barton Fanny, MD  montelukast (SINGULAIR) 10 MG tablet TAKE ONE TABLET BY MOUTH AT BEDTIME 11/24/13   Theda Sers, PA-C  Multiple Vitamins-Minerals (MULTIVITAMIN WITH MINERALS) tablet Take 1 tablet by mouth daily. One  Source Advanced Adult Multi withD3    Historical Provider, MD  naproxen (NAPROSYN) 500 MG tablet Take 1 tablet (500 mg total) by mouth 2 (two) times daily with a meal. 02/07/13   Barton Fanny, MD  omeprazole (PRILOSEC) 20 MG capsule TAKE ONE CAPSULE BY MOUTH ONCE DAILY    Barton Fanny, MD  ramipril (ALTACE) 5 MG capsule Take 1 capsule (5 mg total) by mouth daily. 05/21/13   Barton Fanny, MD  triamcinolone (KENALOG) 0.025 % cream Apply 1 application topically 2 (two) times daily as needed (for skin).     Historical Provider, MD    ALLERGIES:  Allergies  Allergen Reactions  . Protonix [Pantoprazole Sodium]     SOCIAL HISTORY:  History  Substance Use Topics  . Smoking status: Former Smoker    Quit date: 02/02/1969  . Smokeless tobacco: Never Used  . Alcohol Use: No    FAMILY HISTORY: Family History  Problem Relation Age of Onset  . Stomach cancer Mother     EXAM: BP 193/77  Pulse 74  Temp(Src) 97.9 F (36.6 C)  Resp 20  Ht 5' (1.524 m)  Wt 156 lb (70.761 kg)  BMI 30.47 kg/m2  SpO2 100% CONSTITUTIONAL: Alert and oriented and responds appropriately to questions. Well-appearing; well-nourished HEAD: Normocephalic EYES: Conjunctivae clear, PERRL ENT: normal nose; no rhinorrhea; moist mucous membranes; pharynx without lesions noted NECK: Supple, no meningismus, no LAD  CARD: RRR; S1 and S2 appreciated; no murmurs, no clicks, no rubs, no gallops RESP: Normal chest excursion without splinting or tachypnea; breath sounds clear and equal bilaterally; no wheezes, no rhonchi, no rales,  ABD/GI: Normal bowel sounds; non-distended; soft, non-tender, no rebound, no guarding BACK:  The back appears normal and is non-tender to palpation, there is no CVA tenderness EXT: Normal ROM in all joints; non-tender to palpation; no edema; normal capillary refill; no cyanosis    SKIN: Normal color for age and race; warm NEURO: Moves all extremities equally, cranial nerves II  through XII intact, sensation to light touch intact diffusely, no dysmetria to finger-nose testing, normal gait  PSYCH: The patient's mood and manner are appropriate. Grooming and personal hygiene are appropriate.  MEDICAL DECISION MAKING: Patient here with diffuse headache. Not concerned for intracranial hemorrhage the family is requesting a head CT today. She has had no thunderclap headache and is neurologically intact on exam. Her blood pressure is elevated. Suspect her pain may be the cause of her elevated blood pressure. Will treat with pain medication and reassess. We'll obtain basic labs and head CT.  ED PROGRESS: Patient reports no improvement in her headache with morphine but states "it's making me feel funny". Her head CT is unremarkable. Labs are normal. We'll give Toradol, Zofran and reassess.  11:34 PM  Pt's headache is completely resolved with Toradol and Zofran. When I went to reevaluate the patient she was hypoxic in the 70s with sleeping. No history of sleep apnea.  She denies chest pain or shortness of breath. She is easily arousable and when awake her oxygen saturation was 98% on room air. We'll give Narcan and continue to monitor.  12:35 AM  Pt states she is feeling much better. She has been able to ambulate without difficulty. Her oxygen saturation is 92% with sleeping and 98% on room air when awake. We'll discharge patient home. Have discussed with patient that I recommend she keep a log of her blood pressure when she is feeling well and followup with her doctor this week. Have given strict return precautions. Patient and family verbalize understanding and are comfortable with plan.   EKG Interpretation  Date/Time:  Sunday March 22 2014 20:43:55 EDT Ventricular Rate:  75 PR Interval:  164 QRS Duration: 74 QT Interval:  372 QTC Calculation: 415 R Axis:   25 Text Interpretation:  Normal sinus rhythm Normal ECG Confirmed by WARD,  DO, KRISTEN ([id number]) on 03/22/2014 8:49:06 PM          Delice Bison Ward, DO 03/23/14 [place], DO 03/23/14 [id number]

## 2014-03-22 NOTE — ED Notes (Signed)
Unable  To locate the pt

## 2014-03-22 NOTE — ED Notes (Signed)
The pt is also c/o a headache

## 2014-03-22 NOTE — ED Notes (Signed)
The pt is c/o her bp being high today.  She takes bp med and it has been taken today

## 2014-03-23 LAB — URINALYSIS, ROUTINE W REFLEX MICROSCOPIC
Bilirubin Urine: NEGATIVE
GLUCOSE, UA: NEGATIVE mg/dL
HGB URINE DIPSTICK: NEGATIVE
Ketones, ur: NEGATIVE mg/dL
Nitrite: NEGATIVE
PH: 5 (ref 5.0–8.0)
Protein, ur: NEGATIVE mg/dL
SPECIFIC GRAVITY, URINE: 1.018 (ref 1.005–1.030)
Urobilinogen, UA: 0.2 mg/dL (ref 0.0–1.0)

## 2014-03-23 LAB — URINE MICROSCOPIC-ADD ON

## 2014-03-23 NOTE — Discharge Instructions (Signed)
Hypertension As your heart beats, it forces blood through your arteries. This force is your blood pressure. If the pressure is too high, it is called hypertension (HTN) or high blood pressure. HTN is dangerous because you may have it and not know it. High blood pressure may mean that your heart has to work harder to pump blood. Your arteries may be narrow or stiff. The extra work puts you at risk for heart disease, stroke, and other problems.  Blood pressure consists of two numbers, a higher number over a lower, 110/72, for example. It is stated as "110 over 72." The ideal is below 120 for the top number (systolic) and under 80 for the bottom (diastolic). Write down your blood pressure today. You should pay close attention to your blood pressure if you have certain conditions such as:  Heart failure.  Prior heart attack.  Diabetes  Chronic kidney disease.  Prior stroke.  Multiple risk factors for heart disease. To see if you have HTN, your blood pressure should be measured while you are seated with your arm held at the level of the heart. It should be measured at least twice. A one-time elevated blood pressure reading (especially in the Emergency Department) does not mean that you need treatment. There may be conditions in which the blood pressure is different between your right and left arms. It is important to see your caregiver soon for a recheck. Most people have essential hypertension which means that there is not a specific cause. This type of high blood pressure may be lowered by changing lifestyle factors such as:  Stress.  Smoking.  Lack of exercise.  Excessive weight.  Drug/tobacco/alcohol use.  Eating less salt. Most people do not have symptoms from high blood pressure until it has caused damage to the body. Effective treatment can often prevent, delay or reduce that damage. TREATMENT  When a cause has been identified, treatment for high blood pressure is directed at the  cause. There are a large number of medications to treat HTN. These fall into several categories, and your caregiver will help you select the medicines that are best for you. Medications may have side effects. You should review side effects with your caregiver. If your blood pressure stays high after you have made lifestyle changes or started on medicines,   Your medication(s) may need to be changed.  Other problems may need to be addressed.  Be certain you understand your prescriptions, and know how and when to take your medicine.  Be sure to follow up with your caregiver within the time frame advised (usually within two weeks) to have your blood pressure rechecked and to review your medications.  If you are taking more than one medicine to lower your blood pressure, make sure you know how and at what times they should be taken. Taking two medicines at the same time can result in blood pressure that is too low. SEEK IMMEDIATE MEDICAL CARE IF:  You develop a severe headache, blurred or changing vision, or confusion.  You have unusual weakness or numbness, or a faint feeling.  You have severe chest or abdominal pain, vomiting, or breathing problems. MAKE SURE YOU:   Understand these instructions.  Will watch your condition.  Will get help right away if you are not doing well or get worse. Document Released: 12/04/2005 Document Revised: 02/26/2012 Document Reviewed: 07/24/2008 [employer] [facility] Patient Information 2014 [place]. Tension Headache A tension headache is a feeling of pain, pressure, or aching often felt over  the front and sides of the head. The pain can be dull or can feel tight (constricting). It is the most common type of headache. Tension headaches are not normally associated with nausea or vomiting and do not get worse with physical activity. Tension headaches can last 30 minutes to several days.  CAUSES  The exact cause is not known, but it may be caused by chemicals and  hormones in the brain that lead to pain. Tension headaches often begin after stress, anxiety, or depression. Other triggers may include:  Alcohol.  Caffeine (too much or withdrawal).  Respiratory infections (colds, flu, sinus infections).  Dental problems or teeth clenching.  Fatigue.  Holding your head and neck in one position too long while using a computer. SYMPTOMS   Pressure around the head.   Dull, aching head pain.   Pain felt over the front and sides of the head.   Tenderness in the muscles of the head, neck, and shoulders. DIAGNOSIS  A tension headache is often diagnosed based on:   Symptoms.   Physical examination.   A CT scan or MRI of your head. These tests may be ordered if symptoms are severe or unusual. TREATMENT  Medicines may be given to help relieve symptoms.  HOME CARE INSTRUCTIONS   Only take over-the-counter or prescription medicines for pain or discomfort as directed by your caregiver.   Lie down in a dark, quiet room when you have a headache.   Keep a journal to find out what may be triggering your headaches. For example, write down:  What you eat and drink.  How much sleep you get.  Any change to your diet or medicines.  Try massage or other relaxation techniques.   Ice packs or heat applied to the head and neck can be used. Use these 3 to 4 times per day for 15 to 20 minutes each time, or as needed.   Limit stress.   Sit up straight, and do not tense your muscles.   Quit smoking if you smoke.  Limit alcohol use.  Decrease the amount of caffeine you drink, or stop drinking caffeine.  Eat and exercise regularly.  Get 7 to 9 hours of sleep, or as recommended by your caregiver.  Avoid excessive use of pain medicine as recurrent headaches can occur.  SEEK MEDICAL CARE IF:   You have problems with the medicines you were prescribed.  Your medicines do not work.  You have a change from the usual headache.  You have  nausea or vomiting. SEEK IMMEDIATE MEDICAL CARE IF:   Your headache becomes severe.  You have a fever.  You have a stiff neck.  You have loss of vision.  You have muscular weakness or loss of muscle control.  You lose your balance or have trouble walking.  You feel faint or pass out.  You have severe symptoms that are different from your first symptoms. MAKE SURE YOU:   Understand these instructions.  Will watch your condition.  Will get help right away if you are not doing well or get worse. Document Released: 12/04/2005 Document Revised: 02/26/2012 Document Reviewed: 11/24/2011 [facility] Patient Information 2014 [place], [place].

## 2014-03-23 NOTE — ED Notes (Signed)
Alert and oriented X 4. Pt states that pain medication made her head feel funny.

## 2014-03-25 ENCOUNTER — Encounter: Payer: Self-pay | Admitting: Family Medicine

## 2014-03-25 ENCOUNTER — Ambulatory Visit (INDEPENDENT_AMBULATORY_CARE_PROVIDER_SITE_OTHER): Payer: PRIVATE HEALTH INSURANCE | Admitting: Family Medicine

## 2014-03-25 VITALS — BP 160/82 | HR 71 | Temp 97.8°F | Resp 16 | Ht 59.5 in | Wt 157.4 lb

## 2014-03-25 DIAGNOSIS — E119 Type 2 diabetes mellitus without complications: Secondary | ICD-10-CM

## 2014-03-25 DIAGNOSIS — I10 Essential (primary) hypertension: Secondary | ICD-10-CM

## 2014-03-25 MED ORDER — CARBOXYMETHYLCELLULOSE SODIUM 0.5 % OP SOLN: 1.0000 [drp] | Freq: Four times a day (QID) | OPHTHALMIC | Status: DC

## 2014-03-25 MED ORDER — MONTELUKAST SODIUM 10 MG PO TABS: ORAL_TABLET | ORAL | Status: DC

## 2014-03-25 MED ORDER — ALBUTEROL SULFATE HFA 108 (90 BASE) MCG/ACT IN AERS: 2.0000 | INHALATION_SPRAY | Freq: Four times a day (QID) | RESPIRATORY_TRACT | Status: DC | PRN

## 2014-03-25 NOTE — Patient Instructions (Signed)
I have refilled the medications that had no refills. All other medications have refills until June 2015. Your pharmacy will contact us about refills when they are due. I will see you for complete physical exam in 4 months.

## 2014-03-25 NOTE — Progress Notes (Signed)
S:  This 68 y.o. [place] female is here for follow-up after ED evaluation for elevated BP and HA. She states compliance w/ medications. She takes multiple meds together every morning; she has been advised to take Levothyroxine separate from all other meds. Has had mild frontal pressure sensation but BP remains normal. Pt denies vision disturbance, diaphoresis, CP or palpitations, SOB or cough, n/v, dizziness or syncope.  ED evaluation- CT was negative for acute processes and labs were normal. HA resolved w/ Toradol and Zofran.(Morphine caused pt "to feel funny").  Patient Active Problem List   Diagnosis Date Noted  . NEOPLASM UNCERTAIN BEHAVIOR STOMACH INTEST&RECT 07/26/2010  . NONSPECIFIC ABN FINDING RAD & OTH EXAM GI TRACT 07/26/2010  . DYSPEPSIA 05/11/2010  . COLONIC POLYPS, HX OF 03/02/2009  . CONSTIPATION 06/09/2008  . HEPATITIS C 07/29/2007  . PITUITARY ADENOMA 07/29/2007  . HYPOTHYROIDISM NOS 07/29/2007  . DIABETES MELLITUS, TYPE II 07/29/2007  . RESTLESS LEG SYNDROME 07/29/2007  . PERIPHERAL NEUROPATHY 07/29/2007  . HYPERTENSION 07/29/2007  . ASTHMA 07/29/2007  . GERD 07/29/2007   PMHx, Surg Hx, Soc and Fam Hx reviewed. MEDICATIONS reconciled.  ROS: As per HPI.  O: Filed Vitals:   03/25/14 0801  BP: 160/82  Pulse: 71  Temp: 97.8 F (36.6 C)  Resp: 16   GEN: In NAD; WN, WD. HENT: [place]/AT; EOMI w/ clear conj/sclerae, normal EACs/nose and moist oropharynx. COR: RRR. LUNGS: unlabored resp. SKIN: W&D; intact w/o erythema or rashes. NEURO: A&O x 3; CNs intact. Nonfocal.  A/P: HYPERTENSION- Stable on current medication. No change.  DIABETES MELLITUS, TYPE II- Stable A1c 5.6- 5.9 for last 2 years.  Meds ordered this encounter  Medications  . albuterol (PROVENTIL HFA;VENTOLIN HFA) 108 (90 BASE) MCG/ACT inhaler    Sig: Inhale 2 puffs into the lungs every 6 (six) hours as needed for wheezing (cough, shortness of breath or wheezing.).    Dispense:  1 Inhaler    Refill:  5     PLEASE FILL WITH PRO-AIR INHALER  . carboxymethylcellulose (REFRESH PLUS) 0.5 % SOLN    Sig: Place 1 drop into both eyes 4 (four) times daily.    Dispense:  1 Bottle    Refill:  5  . montelukast (SINGULAIR) 10 MG tablet    Sig: TAKE ONE TABLET BY MOUTH ONCE DAILY AT BEDTIME    Dispense:  90 tablet    Refill:  3

## 2014-04-03 ENCOUNTER — Ambulatory Visit (AMBULATORY_SURGERY_CENTER): Payer: Self-pay

## 2014-04-03 VITALS — Ht 60.0 in | Wt 155.0 lb

## 2014-04-03 DIAGNOSIS — Z860100 Personal history of colon polyps, unspecified: Secondary | ICD-10-CM

## 2014-04-03 MED ORDER — MOVIPREP 100 G PO SOLR: 1.0000 | Freq: Once | ORAL | Status: DC

## 2014-04-03 NOTE — Progress Notes (Signed)
Emmi instructions not given.  Pt reported she does not have access to internet. No allergies to egg or soy. No problems with anesthesia. No oxygen use at home. No diet/weight loss meds.

## 2014-04-17 ENCOUNTER — Encounter: Payer: Self-pay | Admitting: Gastroenterology

## 2014-04-17 ENCOUNTER — Ambulatory Visit (AMBULATORY_SURGERY_CENTER): Payer: PRIVATE HEALTH INSURANCE | Admitting: Gastroenterology

## 2014-04-17 VITALS — BP 174/76 | HR 69 | Temp 97.5°F | Resp 19 | Ht 60.0 in | Wt 155.0 lb

## 2014-04-17 DIAGNOSIS — Z860100 Personal history of colon polyps, unspecified: Secondary | ICD-10-CM

## 2014-04-17 DIAGNOSIS — D126 Benign neoplasm of colon, unspecified: Secondary | ICD-10-CM

## 2014-04-17 DIAGNOSIS — Z124 Encounter for screening for malignant neoplasm of cervix: Secondary | ICD-10-CM

## 2014-04-17 DIAGNOSIS — Z800 Family history of malignant neoplasm of digestive organs: Secondary | ICD-10-CM

## 2014-04-17 LAB — GLUCOSE, CAPILLARY
GLUCOSE-CAPILLARY: 106 mg/dL — AB (ref 70–99)
Glucose-Capillary: 112 mg/dL — ABNORMAL HIGH (ref 70–99)

## 2014-04-17 MED ORDER — SODIUM CHLORIDE 0.9 % IV SOLN: 500.0000 mL | INTRAVENOUS | Status: DC

## 2014-04-17 NOTE — Patient Instructions (Addendum)

## 2014-04-17 NOTE — Progress Notes (Signed)
A/ox3 pleased with MAC, report to Wendy RN 

## 2014-04-17 NOTE — Op Note (Signed)
[place]  [address]. [place], [address]   COLONOSCOPY PROCEDURE REPORT  PATIENT: Mandy, Webb  MR#: [id number] BIRTHDATE: 11/01/1946 , 39  yrs. old GENDER: Female ENDOSCOPIST: Milus Banister, MD PROCEDURE DATE:  04/17/2014 PROCEDURE:   Colonoscopy, diagnostic First Screening Colonoscopy - Avg.  risk and is 50 yrs.  old or older - No.  Prior Negative Screening - Now for repeat screening. N/A  History of Adenoma - Now for follow-up colonoscopy & has been > or = to 3 yrs.  Yes hx of adenoma.  Has been 3 or more years since last colonoscopy.  Polyps Removed Today? No.  Recommend repeat exam, <10 yrs? Yes.  High risk (family or personal hx). ASA CLASS:   Class III INDICATIONS:father had colon cancer. MEDICATIONS: MAC sedation, administered by CRNA and Propofol (Diprivan) 120 mg IV  DESCRIPTION OF PROCEDURE:   After the risks benefits and alternatives of the procedure were thoroughly explained, informed consent was obtained.  A digital rectal exam revealed no abnormalities of the rectum.   The LB [id number]  endoscope was introduced through the anus and advanced to the cecum, which was identified by both the appendix and ileocecal valve. No adverse events experienced.   The quality of the prep was good.  The instrument was then slowly withdrawn as the colon was fully examined.  COLON FINDINGS: There were numerous diverticulum throughout the left colon.  The examination was otherwise normal.  Retroflexed views revealed no abnormalities. The time to cecum=3 minutes 24 seconds. Withdrawal time=6 minutes 31 seconds.  The scope was withdrawn and the procedure completed. COMPLICATIONS: There were no complications.  ENDOSCOPIC IMPRESSION: There were numerous diverticulum throughout the left colon. The examination was otherwise normal.  RECOMMENDATIONS: Given your significant family history of colon cancer, you should have a repeat colonoscopy in 5  years   eSigned:  Milus Banister, MD 04/17/2014 10:07 AM   cc:  Ellsworth Lennox, MD

## 2014-04-20 ENCOUNTER — Telehealth: Payer: Self-pay | Admitting: *Deleted

## 2014-04-20 NOTE — Telephone Encounter (Signed)
  Follow up Call-  Call back number 04/17/2014 07/26/2012  Post procedure Call Back phone  # [phone]  Permission to leave phone message Yes Yes     Patient questions:  Do you have a fever, pain , or abdominal swelling? no Pain Score  0 *  Have you tolerated food without any problems? yes  Have you been able to return to your normal activities? yes  Do you have any questions about your discharge instructions: Diet   no Medications  no Follow up visit  no  Do you have questions or concerns about your Care? no  Actions: * If pain score is 4 or above: No action needed, pain <4.

## 2014-04-28 ENCOUNTER — Ambulatory Visit (INDEPENDENT_AMBULATORY_CARE_PROVIDER_SITE_OTHER): Payer: PRIVATE HEALTH INSURANCE

## 2014-04-28 VITALS — BP 122/86 | HR 69 | Resp 12

## 2014-04-28 DIAGNOSIS — B351 Tinea unguium: Secondary | ICD-10-CM

## 2014-04-28 DIAGNOSIS — E1149 Type 2 diabetes mellitus with other diabetic neurological complication: Secondary | ICD-10-CM

## 2014-04-28 DIAGNOSIS — E1140 Type 2 diabetes mellitus with diabetic neuropathy, unspecified: Secondary | ICD-10-CM

## 2014-04-28 DIAGNOSIS — E1142 Type 2 diabetes mellitus with diabetic polyneuropathy: Secondary | ICD-10-CM

## 2014-04-28 DIAGNOSIS — M79609 Pain in unspecified limb: Secondary | ICD-10-CM

## 2014-04-28 NOTE — Progress Notes (Signed)
   Subjective:    Patient ID: Mandy Webb, female    DOB: [birth year], 68 y.o.   MRN: [id number]  HPI Toenails trim.   Review of Systems no new systemic changes or findings noted    Objective:   Physical Exam Neurovascular status is intact pedal pulses palpable DP +2/4 PT plus one over 4 bilateral. Capillary refill time 3 seconds all digits epicritic and proprioceptive sensations decreased on Semmes Weinstein testing to forefoot digits plantar arch. No open wounds ulcerations patient does have some skin lesions or appear be verruca lesions dorsum of the foot and lateral fifth MTP area of the left foot these are nonpainful symptomatic no open wounds ulcerations no secondary infection is noted normal plantar response and DTRs.       Assessment & Plan:  Assessment this time his diabetes with peripheral neuropathy and onychomycosis mycotic nails 1 through 5 bilateral debrided return for future palliative care Natilie Krabbenhoft months again monitor for the verrucoid lesions there is no RE exacerbation of pain for symptomatic no exacerbation at this time  Harriet Masson DPM

## 2014-04-28 NOTE — Patient Instructions (Signed)
Diabetes and Foot Care Diabetes may cause you to have problems because of poor blood supply (circulation) to your feet and legs. This may cause the skin on your feet to become thinner, break easier, and heal more slowly. Your skin may become dry, and the skin may peel and crack. You may also have nerve damage in your legs and feet causing decreased feeling in them. You may not notice minor injuries to your feet that could lead to infections or more serious problems. Taking care of your feet is one of the most important things you can do for yourself.  HOME CARE INSTRUCTIONS  Wear shoes at all times, even in the house. Do not go barefoot. Bare feet are easily injured.  Check your feet daily for blisters, cuts, and redness. If you cannot see the bottom of your feet, use a mirror or ask someone for help.  Wash your feet with warm water (do not use hot water) and mild soap. Then pat your feet and the areas between your toes until they are completely dry. Do not soak your feet as this can dry your skin.  Apply a moisturizing lotion or petroleum jelly (that does not contain alcohol and is unscented) to the skin on your feet and to dry, brittle toenails. Do not apply lotion between your toes.  Trim your toenails straight across. Do not dig under them or around the cuticle. File the edges of your nails with an emery board or nail file.  Do not cut corns or calluses or try to remove them with medicine.  Wear clean socks or stockings every day. Make sure they are not too tight. Do not wear knee-high stockings since they may decrease blood flow to your legs.  Wear shoes that fit properly and have enough cushioning. To break in new shoes, wear them for just a few hours a day. This prevents you from injuring your feet. Always look in your shoes before you put them on to be sure there are no objects inside.  Do not cross your legs. This may decrease the blood flow to your feet.  If you find a minor scrape,  cut, or break in the skin on your feet, keep it and the skin around it clean and dry. These areas may be cleansed with mild soap and water. Do not cleanse the area with peroxide, alcohol, or iodine.  When you remove an adhesive bandage, be sure not to damage the skin around it.  If you have a wound, look at it several times a day to make sure it is healing.  Do not use heating pads or hot water bottles. They may burn your skin. If you have lost feeling in your feet or legs, you may not know it is happening until it is too late.  Make sure your health care provider performs a complete foot exam at least annually or more often if you have foot problems. Report any cuts, sores, or bruises to your health care provider immediately. SEEK MEDICAL CARE IF:   You have an injury that is not healing.  You have cuts or breaks in the skin.  You have an ingrown nail.  You notice redness on your legs or feet.  You feel burning or tingling in your legs or feet.  You have pain or cramps in your legs and feet.  Your legs or feet are numb.  Your feet always feel cold. SEEK IMMEDIATE MEDICAL CARE IF:   There is increasing redness,   swelling, or pain in or around a wound.  There is a red line that goes up your leg.  Pus is coming from a wound.  You develop a fever or as directed by your health care provider.  You notice a bad smell coming from an ulcer or wound. Document Released: 12/01/2000 Document Revised: 08/06/2013 Document Reviewed: 05/13/2013 Exit[employer] Patient Information 2014 [employer].  

## 2014-05-04 ENCOUNTER — Other Ambulatory Visit: Payer: Self-pay | Admitting: Gastroenterology

## 2014-05-14 ENCOUNTER — Other Ambulatory Visit: Payer: Self-pay | Admitting: Gastroenterology

## 2014-05-28 ENCOUNTER — Other Ambulatory Visit: Payer: Self-pay | Admitting: Family Medicine

## 2014-05-29 ENCOUNTER — Other Ambulatory Visit: Payer: Self-pay | Admitting: Family Medicine

## 2014-06-04 ENCOUNTER — Other Ambulatory Visit: Payer: Self-pay | Admitting: Family Medicine

## 2014-06-25 ENCOUNTER — Telehealth: Payer: Self-pay

## 2014-06-25 NOTE — Telephone Encounter (Signed)
Pt called in and wanted to speak to Dr Leward Quan about the medication Neurontin. She want to know how long she has been on this med and wants to know why she is on it. Also wanted to know how a test for Neuropathy is done. She can be reached @ [phone]. Thank you

## 2014-06-25 NOTE — Telephone Encounter (Signed)
Pt would like to go see a Neurologist to have a nerve conduction study. She is wanting to determine which nerves are causing her pain and if she might be able to have anything done to solve the problem rather than take the medication. I have pended the order.

## 2014-07-01 NOTE — Telephone Encounter (Signed)
Pt was informed that Dr. Leward Quan will discuss options about her neuropathy at her next appt. She understood that a neuro referral was not appropriate at this time.

## 2014-07-01 NOTE — Telephone Encounter (Signed)
Pt has neuropathy of lower extremities due to lumbar disc disease. Some of it may be due to Diabetes. I discussed need for further evaluation with a spine specialist but pt declined. Seeing a neurologist at this time would not be the appropriate referral at this time for this problem. She has CPE w/ me in August; I will discuss this with her at that time.

## 2014-07-02 ENCOUNTER — Other Ambulatory Visit: Payer: Self-pay | Admitting: Family Medicine

## 2014-07-23 ENCOUNTER — Other Ambulatory Visit: Payer: Self-pay | Admitting: Physician Assistant

## 2014-07-24 ENCOUNTER — Other Ambulatory Visit: Payer: Self-pay

## 2014-07-24 MED ORDER — METOCLOPRAMIDE HCL 10 MG PO TABS: 10.0000 mg | ORAL_TABLET | Freq: Every day | ORAL | Status: DC

## 2014-07-25 ENCOUNTER — Other Ambulatory Visit: Payer: Self-pay | Admitting: Physician Assistant

## 2014-07-25 ENCOUNTER — Other Ambulatory Visit: Payer: Self-pay | Admitting: Family Medicine

## 2014-07-25 NOTE — Telephone Encounter (Signed)
Pt has an appt on 07/30/14. Called to see if she needed a RF until then. She does. Sent in 71

## 2014-07-30 ENCOUNTER — Encounter: Payer: PRIVATE HEALTH INSURANCE | Admitting: Family Medicine

## 2014-08-04 ENCOUNTER — Ambulatory Visit (INDEPENDENT_AMBULATORY_CARE_PROVIDER_SITE_OTHER): Payer: PRIVATE HEALTH INSURANCE

## 2014-08-04 DIAGNOSIS — E1140 Type 2 diabetes mellitus with diabetic neuropathy, unspecified: Secondary | ICD-10-CM

## 2014-08-04 DIAGNOSIS — E1149 Type 2 diabetes mellitus with other diabetic neurological complication: Secondary | ICD-10-CM

## 2014-08-04 DIAGNOSIS — M79609 Pain in unspecified limb: Secondary | ICD-10-CM

## 2014-08-04 DIAGNOSIS — B351 Tinea unguium: Secondary | ICD-10-CM

## 2014-08-04 DIAGNOSIS — E1142 Type 2 diabetes mellitus with diabetic polyneuropathy: Secondary | ICD-10-CM

## 2014-08-04 DIAGNOSIS — M79673 Pain in unspecified foot: Secondary | ICD-10-CM

## 2014-08-04 NOTE — Progress Notes (Signed)
   Subjective:    Patient ID: Mandy Webb, female    DOB: 15-Sep-1946, 68 y.o.   MRN: [id number]  HPI Comments: Pt present for trimming of elongated toenails 10 toenails.     Review of Systems No new findings or systemic changes in the    Objective:   Physical Exam Vascular status is intact pedal pulses palpable DP +2/4 PT one over 4 bilateral capillary refill time 3 seconds. Proprioceptive sensations diminished on Semmes Weinstein testing to forefoot digits her patient x-rays significant hyperesthesia in her feet burning and stinging sensation at times been taking gabapentin 3 mg 4 times daily her continues to have abnormal symptoms seem to be worsening with both feet. All plantar response DTRs not listed open wounds ulcerations no secondary infections at dystrophic friable criptotic nails 1 through 5 bilateral this time are debrided       Assessment & Plan:  Assessment diabetes with history peripheral neuropathy and hyperesthesia mycotic nails 1 through 5 bilateral debridement at this time return for palliative care is needed suggest 3 month followup for nail care. Maintain her gabapentin maintain a vitamin supplements stressed the importance of wearing socks rather shoes at all times.  Harriet Masson DPM

## 2014-08-04 NOTE — Patient Instructions (Signed)
Diabetes and Foot Care Diabetes may cause you to have problems because of poor blood supply (circulation) to your feet and legs. This may cause the skin on your feet to become thinner, break easier, and heal more slowly. Your skin may become dry, and the skin may peel and crack. You may also have nerve damage in your legs and feet causing decreased feeling in them. You may not notice minor injuries to your feet that could lead to infections or more serious problems. Taking care of your feet is one of the most important things you can do for yourself.  HOME CARE INSTRUCTIONS  Wear shoes at all times, even in the house. Do not go barefoot. Bare feet are easily injured.  Check your feet daily for blisters, cuts, and redness. If you cannot see the bottom of your feet, use a mirror or ask someone for help.  Wash your feet with warm water (do not use hot water) and mild soap. Then pat your feet and the areas between your toes until they are completely dry. Do not soak your feet as this can dry your skin.  Apply a moisturizing lotion or petroleum jelly (that does not contain alcohol and is unscented) to the skin on your feet and to dry, brittle toenails. Do not apply lotion between your toes.  Trim your toenails straight across. Do not dig under them or around the cuticle. File the edges of your nails with an emery board or nail file.  Do not cut corns or calluses or try to remove them with medicine.  Wear clean socks or stockings every day. Make sure they are not too tight. Do not wear knee-high stockings since they may decrease blood flow to your legs.  Wear shoes that fit properly and have enough cushioning. To break in new shoes, wear them for just a few hours a day. This prevents you from injuring your feet. Always look in your shoes before you put them on to be sure there are no objects inside.  Do not cross your legs. This may decrease the blood flow to your feet.  If you find a minor scrape,  cut, or break in the skin on your feet, keep it and the skin around it clean and dry. These areas may be cleansed with mild soap and water. Do not cleanse the area with peroxide, alcohol, or iodine.  When you remove an adhesive bandage, be sure not to damage the skin around it.  If you have a wound, look at it several times a day to make sure it is healing.  Do not use heating pads or hot water bottles. They may burn your skin. If you have lost feeling in your feet or legs, you may not know it is happening until it is too late.  Make sure your health care provider performs a complete foot exam at least annually or more often if you have foot problems. Report any cuts, sores, or bruises to your health care provider immediately. SEEK MEDICAL CARE IF:   You have an injury that is not healing.  You have cuts or breaks in the skin.  You have an ingrown nail.  You notice redness on your legs or feet.  You feel burning or tingling in your legs or feet.  You have pain or cramps in your legs and feet.  Your legs or feet are numb.  Your feet always feel cold. SEEK IMMEDIATE MEDICAL CARE IF:   There is increasing redness,   swelling, or pain in or around a wound.  There is a red line that goes up your leg.  Pus is coming from a wound.  You develop a fever or as directed by your health care provider.  You notice a bad smell coming from an ulcer or wound. Document Released: 12/01/2000 Document Revised: 08/06/2013 Document Reviewed: 05/13/2013 [employer] Patient Information 2015 [employer]. This information is not intended to replace advice given to you by your health care provider. Make sure you discuss any questions you have with your health care provider.  

## 2014-08-21 ENCOUNTER — Other Ambulatory Visit: Payer: Self-pay | Admitting: Family Medicine

## 2014-08-27 ENCOUNTER — Other Ambulatory Visit: Payer: Self-pay | Admitting: Family Medicine

## 2014-08-28 NOTE — Telephone Encounter (Signed)
Pt has appt sch for 12/17/14 for CPE. OK to refill for 90 days?

## 2014-08-30 NOTE — Telephone Encounter (Signed)
Medications refilled for 90 days + 1 refill.

## 2014-09-04 ENCOUNTER — Other Ambulatory Visit: Payer: Self-pay | Admitting: Physician Assistant

## 2014-09-04 NOTE — Telephone Encounter (Signed)
PT CALLED REGARDING A REFILL OF

## 2014-09-16 ENCOUNTER — Ambulatory Visit (INDEPENDENT_AMBULATORY_CARE_PROVIDER_SITE_OTHER): Payer: PRIVATE HEALTH INSURANCE | Admitting: Family Medicine

## 2014-09-16 VITALS — BP 132/70 | HR 105 | Temp 97.7°F | Resp 17 | Ht 60.5 in | Wt 156.0 lb

## 2014-09-16 DIAGNOSIS — R059 Cough, unspecified: Secondary | ICD-10-CM

## 2014-09-16 DIAGNOSIS — R05 Cough: Secondary | ICD-10-CM

## 2014-09-16 DIAGNOSIS — J069 Acute upper respiratory infection, unspecified: Secondary | ICD-10-CM

## 2014-09-16 DIAGNOSIS — E119 Type 2 diabetes mellitus without complications: Secondary | ICD-10-CM

## 2014-09-16 MED ORDER — BENZONATATE 100 MG PO CAPS: 100.0000 mg | ORAL_CAPSULE | Freq: Three times a day (TID) | ORAL | Status: DC | PRN

## 2014-09-16 NOTE — Progress Notes (Signed)
[facility] [address] [place] [address] [phone]  Date:  09/16/2014   Name:  Mandy Webb   DOB:  1946/10/06   MRN:  [id number]  PCP:  Elmer Bales, MD    Chief Complaint: Cough, Sore Throat and Abdominal Pain   History of Present Illness:  Mandy Webb is a 68 y.o. very pleasant female patient who presents with the following:  She went to a health expo recently and received her flu shot on 09/01/14.  Over the last couple of days she has noted a ST.  She tried some chloraseptic spray which does help.  Also, she has been coughing for the last 4 days or so.  She will noted pain in her abdominal muscles when she coughs. She coughed worse last night.  She thought that she might vomit from coughing but she did not.   The cough is dry.   She has not noted a fever, no diarrhea.   She would like to check her A1c today as well as it has been several months  Lab Results  Component Value Date   HGBA1C 5.8 01/15/2014     Patient Active Problem List   Diagnosis Date Noted  . NEOPLASM UNCERTAIN BEHAVIOR STOMACH INTEST&RECT 07/26/2010  . NONSPECIFIC ABN FINDING RAD & OTH EXAM GI TRACT 07/26/2010  . DYSPEPSIA 05/11/2010  . COLONIC POLYPS, HX OF 03/02/2009  . CONSTIPATION 06/09/2008  . HEPATITIS C 07/29/2007  . PITUITARY ADENOMA 07/29/2007  . HYPOTHYROIDISM NOS 07/29/2007  . DIABETES MELLITUS, TYPE II 07/29/2007  . RESTLESS LEG SYNDROME 07/29/2007  . PERIPHERAL NEUROPATHY 07/29/2007  . HYPERTENSION 07/29/2007  . ASTHMA 07/29/2007  . GERD 07/29/2007    Past Medical History  Diagnosis Date  . Diabetes mellitus   . Hyperlipidemia   . Hypertension   . Allergy   . Asthma   . Arthritis     Past Surgical History  Procedure Laterality Date  . Appendectomy    . Pituitary surgery    . Tubal ligation    . Abdominal hysterectomy      History  Substance Use Topics  . Smoking status: Former Smoker    Quit date: 02/02/1969  . Smokeless  tobacco: Never Used  . Alcohol Use: No    Family History  Problem Relation Age of Onset  . Stomach cancer Mother     Allergies  Allergen Reactions  . Protonix [Pantoprazole Sodium]     Medication list has been reviewed and updated.  Current Outpatient Prescriptions on File Prior to Visit  Medication Sig Dispense Refill  . albuterol (PROVENTIL HFA;VENTOLIN HFA) 108 (90 BASE) MCG/ACT inhaler Inhale 2 puffs into the lungs every 6 (six) hours as needed for wheezing (cough, shortness of breath or wheezing.).  1 Inhaler  5  . ammonium lactate (LAC-HYDRIN) 12 % lotion Apply 1 application topically as needed for dry skin.  400 g  1  . atorvastatin (LIPITOR) 20 MG tablet TAKE ONE TABLET BY MOUTH ONCE DAILY  90 tablet  0  . atorvastatin (LIPITOR) 20 MG tablet Take 1 tablet (20 mg total) by mouth daily. PATIENT NEEDS OFFICE VISIT FOR ADDITIONAL REFILLS  30 tablet  0  . atorvastatin (LIPITOR) 20 MG tablet TAKE ONE TABLET BY MOUTH ONCE DAILY  90 tablet  1  . Blood Glucose Monitoring Suppl (BLOOD GLUCOSE METER) kit Use as instructed  1 each  0  . brimonidine (ALPHAGAN P) 0.1 % SOLN Place 1 drop into both eyes 3 (  three) times daily.      . brinzolamide (AZOPT) 1 % ophthalmic suspension 1 drop 3 (three) times daily.      . carboxymethylcellulose (REFRESH PLUS) 0.5 % SOLN Place 1 drop into both eyes 4 (four) times daily.  1 Bottle  5  . fish oil-omega-3 fatty acids 1000 MG capsule Take 2 g by mouth daily as needed (for vitamin).       . Fluticasone-Salmeterol (ADVAIR) 500-50 MCG/DOSE AEPB Inhale 1 puff into the lungs every 12 (twelve) hours.  60 each  11  . gabapentin (NEURONTIN) 600 MG tablet TAKE ONE TABLET BY MOUTH 4 TIMES DAILY  360 tablet  0  . glucose blood (ONE TOUCH ULTRA TEST) test strip Use to test blood sugar daily. Dx code: 250.00  100 each  3  . glucose blood test strip Use as instructed  100 each  12  . Lancets (ONETOUCH ULTRASOFT) lancets Use as instructed. Dx code 250.00 patient checks  blood sugar tid  100 each  12  . levothyroxine (SYNTHROID, LEVOTHROID) 88 MCG tablet Take 1 tablet (88 mcg total) by mouth daily.  90 tablet  3  . LINZESS 145 MCG CAPS capsule TAKE ONE CAPSULE BY MOUTH ONCE DAILY  30 capsule  5  . metFORMIN (GLUCOPHAGE-XR) 500 MG 24 hr tablet TAKE ONE TABLET BY MOUTH ONCE DAILY WITH BREAKFAST  90 tablet  0  . metoCLOPramide (REGLAN) 10 MG tablet Take 1 tablet (10 mg total) by mouth daily.  90 tablet  3  . montelukast (SINGULAIR) 10 MG tablet TAKE ONE TABLET BY MOUTH ONCE DAILY AT BEDTIME  90 tablet  3  . Multiple Vitamins-Minerals (MULTIVITAMIN WITH MINERALS) tablet Take 1 tablet by mouth daily. One Source Advanced Adult Multi withD3      . naproxen (NAPROSYN) 500 MG tablet Take 1 tablet (500 mg total) by mouth 2 (two) times daily with a meal.  60 tablet  3  . omeprazole (PRILOSEC) 20 MG capsule TAKE ONE CAPSULE BY MOUTH ONCE DAILY  30 capsule  3  . OVER THE COUNTER MEDICATION Cascara granada (laxative) 421m as needed      . ramipril (ALTACE) 5 MG capsule TAKE ONE CAPSULE BY MOUTH ONCE DAILY  90 capsule  1  . triamcinolone (KENALOG) 0.025 % cream Apply 1 application topically 2 (two) times daily as needed (for skin).        No current facility-administered medications on file prior to visit.    Review of Systems:  As per HPI- otherwise negative.   Physical Examination: Filed Vitals:   09/16/14 0921  BP: 132/70  Pulse: 105  Temp: 97.7 F (36.5 C)  Resp: 17   Filed Vitals:   09/16/14 0921  Height: 5' 0.5" (1.537 m)  Weight: 156 lb (70.761 kg)   Body mass index is 29.95 kg/(m^2). Ideal Body Weight: Weight in (lb) to have BMI = 25: 129.9  GEN: WDWN, NAD, Non-toxic, A & O x 3, obese, looks well HEENT: Atraumatic, Normocephalic. Neck supple. No masses, No LAD.  Bilateral TM wnl, oropharynx normal.  PEERL,EOMI.   Ears and Nose: No external deformity. CV: RRR, No M/G/R. No JVD. No thrill. No extra heart sounds.  Pulse 90 BPm PULM: CTA B, no  wheezes, crackles, rhonchi. No retractions. No resp. distress. No accessory muscle use. ABD: S, NT, ND. She is sore over the ribs bilaterally from coughing.  otherwise no abdominal pain EXTR: No c/c/e NEURO Normal gait.  PSYCH: Normally interactive. Conversant. Not depressed  or anxious appearing.  Calm demeanor.   Ordered labs including CBC, CMP and A1c Pt left before I could do her labs as her "scat bus" had arrived Assessment and Plan: Cough - Plan: benzonatate (TESSALON) 100 MG capsule, CANCELED: POCT CBC  Viral URI - Plan: CANCELED: POCT CBC  Type II or unspecified type diabetes mellitus without mention of complication, not stated as uncontrolled - Plan: CANCELED: POCT glycosylated hemoglobin (Hb A1C), CANCELED: Comprehensive metabolic panel  Seen today with likely viral URI with sx for a few days.  Pt had to leave suddenly as her ride was here.  Was not able to stay for labs.  I did rx tessalon perles to use as needed for he cough, asked her to see [place] soon if not feeling better  Signed Lamar Blinks, MD

## 2014-10-09 ENCOUNTER — Other Ambulatory Visit: Payer: Self-pay | Admitting: Physician Assistant

## 2014-10-21 ENCOUNTER — Encounter: Payer: Self-pay | Admitting: Family Medicine

## 2014-10-21 ENCOUNTER — Ambulatory Visit (INDEPENDENT_AMBULATORY_CARE_PROVIDER_SITE_OTHER): Payer: PRIVATE HEALTH INSURANCE | Admitting: Family Medicine

## 2014-10-21 VITALS — BP 170/92 | HR 82 | Temp 98.0°F | Resp 16 | Ht 60.0 in | Wt 155.4 lb

## 2014-10-21 DIAGNOSIS — M5442 Lumbago with sciatica, left side: Secondary | ICD-10-CM

## 2014-10-21 DIAGNOSIS — M5136 Other intervertebral disc degeneration, lumbar region: Secondary | ICD-10-CM

## 2014-10-21 MED ORDER — GABAPENTIN 600 MG PO TABS: ORAL_TABLET | ORAL | Status: DC

## 2014-10-21 MED ORDER — LEVOTHYROXINE SODIUM 88 MCG PO TABS: 88.0000 ug | ORAL_TABLET | Freq: Every day | ORAL | Status: DC

## 2014-10-21 MED ORDER — FLUTICASONE-SALMETEROL 500-50 MCG/DOSE IN AEPB: 1.0000 | INHALATION_SPRAY | Freq: Two times a day (BID) | RESPIRATORY_TRACT | Status: DC

## 2014-10-21 NOTE — Progress Notes (Signed)
S:  This 68 y.o. [place] female is here to day for evaluation of acute back strain. She has know lumbar DDD, proven on MRI in October 2014. Pt had PT last fall as well as ORTHO evaluation with good results. She bent forward to pick up something off the floor and felt a pain in her lower back; pain on L side radiates into lower leg.  Difficult to climb into bed because she has to use a step stool (mattress is high pillow-top). Pt has relief with sitting and can bend forward from a seated position. She can flex forward at the waist to ~ 45 degrees w/o pain.   Patient Active Problem List   Diagnosis Date Noted  . NEOPLASM UNCERTAIN BEHAVIOR STOMACH INTEST&RECT 07/26/2010  . NONSPECIFIC ABN FINDING RAD & OTH EXAM GI TRACT 07/26/2010  . DYSPEPSIA 05/11/2010  . COLONIC POLYPS, HX OF 03/02/2009  . CONSTIPATION 06/09/2008  . HEPATITIS C 07/29/2007  . PITUITARY ADENOMA 07/29/2007  . HYPOTHYROIDISM NOS 07/29/2007  . DIABETES MELLITUS, TYPE II 07/29/2007  . RESTLESS LEG SYNDROME 07/29/2007  . PERIPHERAL NEUROPATHY 07/29/2007  . HYPERTENSION 07/29/2007  . ASTHMA 07/29/2007  . GERD 07/29/2007    Prior to Admission medications   Medication Sig Start Date End Date Taking? Authorizing Provider  albuterol (PROVENTIL HFA;VENTOLIN HFA) 108 (90 BASE) MCG/ACT inhaler Inhale 2 puffs into the lungs every 6 (six) hours as needed for wheezing (cough, shortness of breath or wheezing.). 03/25/14  Yes Maurice March, MD  atorvastatin (LIPITOR) 20 MG tablet TAKE ONE TABLET BY MOUTH ONCE DAILY 08/30/14  Yes Maurice March, MD  Blood Glucose Monitoring Suppl (BLOOD GLUCOSE METER) kit Use as instructed 11/06/12  Yes Heather M Marte, PA-C  brimonidine (ALPHAGAN P) 0.1 % SOLN Place 1 drop into both eyes 3 (three) times daily.   Yes Historical Provider, MD  brinzolamide (AZOPT) 1 % ophthalmic suspension 1 drop 3 (three) times daily.   Yes Historical Provider, MD  carboxymethylcellulose (REFRESH PLUS) 0.5 % SOLN Place 1  drop into both eyes 4 (four) times daily. 03/25/14  Yes Maurice March, MD  fish oil-omega-3 fatty acids 1000 MG capsule Take 2 g by mouth daily as needed (for vitamin).    Yes Historical Provider, MD  Fluticasone-Salmeterol (ADVAIR) 500-50 MCG/DOSE AEPB Inhale 1 puff into the lungs every 12 (twelve) hours. 10/21/14  Yes Maurice March, MD  gabapentin (NEURONTIN) 600 MG tablet TAKE ONE TABLET BY MOUTH 4 TIMES DAILY 10/21/14  Yes Maurice March, MD  glucose blood (ONE TOUCH ULTRA TEST) test strip Use to test blood sugar daily. Dx code: 250.00 01/19/14  Yes Morrell Riddle, PA-C  glucose blood test strip Use as instructed 11/04/12  Yes Eleanore Delia Chimes, PA-C  Lancets [facility] ULTRASOFT) lancets Use as instructed. Dx code 250.00 patient checks blood sugar tid 11/06/12  Yes Heather M Marte, PA-C  levothyroxine (SYNTHROID, LEVOTHROID) 88 MCG tablet Take 1 tablet (88 mcg total) by mouth daily. 10/21/14  Yes Maurice March, MD  LINZESS 145 MCG CAPS capsule TAKE ONE CAPSULE BY MOUTH ONCE DAILY 05/04/14  Yes Rachael Fee, MD  metFORMIN (GLUCOPHAGE-XR) 500 MG 24 hr tablet TAKE ONE TABLET BY MOUTH ONCE DAILY WITH BREAKFAST 10/09/14  Yes Morrell Riddle, PA-C  metoCLOPramide (REGLAN) 10 MG tablet Take 1 tablet (10 mg total) by mouth daily. 07/24/14  Yes Rachael Fee, MD  montelukast (SINGULAIR) 10 MG tablet TAKE ONE TABLET BY MOUTH ONCE DAILY AT BEDTIME 03/25/14  Yes Barton Fanny, MD  Multiple Vitamins-Minerals (MULTIVITAMIN WITH MINERALS) tablet Take 1 tablet by mouth daily. One Source Advanced Adult Multi withD3   Yes Historical Provider, MD  omeprazole (PRILOSEC) 20 MG capsule TAKE ONE CAPSULE BY MOUTH ONCE DAILY   Yes Barton Fanny, MD  OVER THE COUNTER MEDICATION Cascara granada (laxative) 450mg  as needed   Yes Historical Provider, MD  ramipril (ALTACE) 5 MG capsule TAKE ONE CAPSULE BY MOUTH ONCE DAILY 08/30/14  Yes Barton Fanny, MD  triamcinolone (KENALOG) 0.025 % cream Apply 1  application topically 2 (two) times daily as needed (for skin).    Yes Historical Provider, MD  ammonium lactate (LAC-HYDRIN) 12 % lotion Apply 1 application topically as needed for dry skin. 01/15/14   Barton Fanny, MD  benzonatate (TESSALON) 100 MG capsule Take 1 capsule (100 mg total) by mouth 3 (three) times daily as needed for cough. 09/16/14   Gay Filler Copland, MD    ROS: As per HPI; negative for fever, abnormal weight loss, fatigue, change in toilet habits, numbness or weakness in lower limbs.  O: Filed Vitals:   10/21/14 1138  BP: 170/92  Pulse: 82  Temp: 98 F (36.7 C)  Resp: 16    GEN: In NAD; mild discomfort with forward flesxion. HENT: [place]/AT; EOMI w/ clear conj/sclerae. Otherwise unremarkable. COR: RRR. LUNGS: Unlabored resp. BACK: Spine is straight w/o spinous process tenderness. Spasms in lumbar paraspinous region; forward flexion to 45 degrees. SLR + on L at 60 degrees. NEURO: A&O x 3; CNs intact. Gait is antalgic; pt ambulates w/ a cane.  Lumbar MRI (09/28/2013): Mild progression of degenerative changes L3-4 through L5-S1 w/o significant spinal stenosis or nerve root compression.  I reviewed the images and report w/ pt.  A/P: Left-sided low back pain with left-sided sciatica May continue Aleve, topical analgesic and rest. Expect improvement in next 2-3 weeks. If not improved, contact clinic for follow-up.  DDD (degenerative disc disease), lumbar- This is chronic and MRI shows worse disease at L4-5; there is 3 mm anterior slippage of L4 w/ bulge.

## 2014-10-21 NOTE — Patient Instructions (Signed)
You can continue to take Aleve 1 or 2 tablets daily for back pain. Use a topical cream to reduce pain ([address], [place], [place], Arnica Cream). Moist heat may help also. Try to avoid straining your back in any way to allow the spasms and pain to subside. If you are not better before Thanksgiving, contact the clinic for an appointment.

## 2014-10-29 ENCOUNTER — Telehealth: Payer: Self-pay

## 2014-10-29 DIAGNOSIS — M5136 Other intervertebral disc degeneration, lumbar region: Secondary | ICD-10-CM

## 2014-10-29 NOTE — Telephone Encounter (Signed)
Patient is calling saying that her back pain is not any better. Wants to know if she should go to orthopaedic doctor again. CB # [phone]

## 2014-10-31 DIAGNOSIS — M5136 Other intervertebral disc degeneration, lumbar region: Secondary | ICD-10-CM | POA: Insufficient documentation

## 2014-10-31 DIAGNOSIS — M51369 Other intervertebral disc degeneration, lumbar region without mention of lumbar back pain or lower extremity pain: Secondary | ICD-10-CM | POA: Insufficient documentation

## 2014-10-31 NOTE — Telephone Encounter (Signed)
That is the next reasonable step if she did not get any benefit from PT.  She has seen Dr. Noemi Chapel in the past (2011); I will order a referral but we would need to specify that this pt needs spine/DDD specialist. I think Dr. Louanne Skye is in the [place] and has some expertise w/ spine disease so I would suggest an appt with him.

## 2014-11-02 NOTE — Telephone Encounter (Signed)
Pt called saying we called her. It looks like referrals was calling to inform her of her orthopaedic appt 11/23/14 at 2:30pm with Dr. Louanne Skye. I informed pt of appt.

## 2014-11-05 ENCOUNTER — Other Ambulatory Visit: Payer: Self-pay | Admitting: Gastroenterology

## 2014-11-10 ENCOUNTER — Ambulatory Visit: Payer: PRIVATE HEALTH INSURANCE

## 2014-11-11 ENCOUNTER — Ambulatory Visit (INDEPENDENT_AMBULATORY_CARE_PROVIDER_SITE_OTHER): Payer: PRIVATE HEALTH INSURANCE | Admitting: Podiatry

## 2014-11-11 ENCOUNTER — Encounter: Payer: Self-pay | Admitting: Podiatry

## 2014-11-11 VITALS — BP 121/77 | HR 92 | Resp 12

## 2014-11-11 DIAGNOSIS — B351 Tinea unguium: Secondary | ICD-10-CM

## 2014-11-11 DIAGNOSIS — M79676 Pain in unspecified toe(s): Secondary | ICD-10-CM

## 2014-11-11 NOTE — Patient Instructions (Signed)
Diabetes and Foot Care Diabetes may cause you to have problems because of poor blood supply (circulation) to your feet and legs. This may cause the skin on your feet to become thinner, break easier, and heal more slowly. Your skin may become dry, and the skin may peel and crack. You may also have nerve damage in your legs and feet causing decreased feeling in them. You may not notice minor injuries to your feet that could lead to infections or more serious problems. Taking care of your feet is one of the most important things you can do for yourself.  HOME CARE INSTRUCTIONS  Wear shoes at all times, even in the house. Do not go barefoot. Bare feet are easily injured.  Check your feet daily for blisters, cuts, and redness. If you cannot see the bottom of your feet, use a mirror or ask someone for help.  Wash your feet with warm water (do not use hot water) and mild soap. Then pat your feet and the areas between your toes until they are completely dry. Do not soak your feet as this can dry your skin.  Apply a moisturizing lotion or petroleum jelly (that does not contain alcohol and is unscented) to the skin on your feet and to dry, brittle toenails. Do not apply lotion between your toes.  Trim your toenails straight across. Do not dig under them or around the cuticle. File the edges of your nails with an emery board or nail file.  Do not cut corns or calluses or try to remove them with medicine.  Wear clean socks or stockings every day. Make sure they are not too tight. Do not wear knee-high stockings since they may decrease blood flow to your legs.  Wear shoes that fit properly and have enough cushioning. To break in new shoes, wear them for just a few hours a day. This prevents you from injuring your feet. Always look in your shoes before you put them on to be sure there are no objects inside.  Do not cross your legs. This may decrease the blood flow to your feet.  If you find a minor scrape,  cut, or break in the skin on your feet, keep it and the skin around it clean and dry. These areas may be cleansed with mild soap and water. Do not cleanse the area with peroxide, alcohol, or iodine.  When you remove an adhesive bandage, be sure not to damage the skin around it.  If you have a wound, look at it several times a day to make sure it is healing.  Do not use heating pads or hot water bottles. They may burn your skin. If you have lost feeling in your feet or legs, you may not know it is happening until it is too late.  Make sure your health care provider performs a complete foot exam at least annually or more often if you have foot problems. Report any cuts, sores, or bruises to your health care provider immediately. SEEK MEDICAL CARE IF:   You have an injury that is not healing.  You have cuts or breaks in the skin.  You have an ingrown nail.  You notice redness on your legs or feet.  You feel burning or tingling in your legs or feet.  You have pain or cramps in your legs and feet.  Your legs or feet are numb.  Your feet always feel cold. SEEK IMMEDIATE MEDICAL CARE IF:   There is increasing redness,   swelling, or pain in or around a wound.  There is a red line that goes up your leg.  Pus is coming from a wound.  You develop a fever or as directed by your health care provider.  You notice a bad smell coming from an ulcer or wound. Document Released: 12/01/2000 Document Revised: 08/06/2013 Document Reviewed: 05/13/2013 [employer] Patient Information 2015 [employer]. This information is not intended to replace advice given to you by your health care provider. Make sure you discuss any questions you have with your health care provider.  

## 2014-11-12 NOTE — Progress Notes (Signed)
Patient ID: Mandy Webb, female   DOB: 1946-01-01, 68 y.o.   MRN: [id number]  Subjective: This patient presents for follow-up care complaining of painful toenails.  Objective: The toenails are elongated, incurvated, discolored 1-4 bilaterally  Assessment: Symptomatic onychomycoses times a History of diabetes with peripheral neuropathy    Plan: Nails 8 are debrided without a bleeding  Reappoint 3 months

## 2014-11-25 ENCOUNTER — Telehealth: Payer: Self-pay

## 2014-11-25 NOTE — Telephone Encounter (Signed)
Please advise if ok to change referral to [place] instead of [place].

## 2014-11-25 NOTE — Telephone Encounter (Signed)
Patient went to her appointment yesterday at [facility] but had to leave. She stated she waited a really long time and her transportation came for her. She did rescheduled but the next available appointment was 12/23/13. Patient is wanting to be seen sooner and requesting to be referred to [facility]. Per patient she has been there before. Patients call back number is [phone]

## 2014-11-26 NOTE — Telephone Encounter (Signed)
Yes, it is okay to refer to [facility].

## 2014-12-08 NOTE — Telephone Encounter (Signed)
Referrals made a new appt to go to [place]

## 2014-12-14 ENCOUNTER — Telehealth: Payer: Self-pay | Admitting: Gastroenterology

## 2014-12-14 NOTE — Telephone Encounter (Signed)
The pt will try florastor OTC and see if that helps her.  Her daughter in law uses VSL #3 and she thought she may be able to use it as well.  She will try the florastor and call back if she does not think it helps

## 2014-12-14 NOTE — Telephone Encounter (Signed)
No answer and no voice mail, I do not see VSL 3 on her med list

## 2014-12-16 ENCOUNTER — Encounter: Payer: Self-pay | Admitting: Family Medicine

## 2014-12-16 ENCOUNTER — Ambulatory Visit (INDEPENDENT_AMBULATORY_CARE_PROVIDER_SITE_OTHER): Payer: PRIVATE HEALTH INSURANCE | Admitting: Family Medicine

## 2014-12-16 VITALS — BP 155/78 | HR 85 | Temp 98.1°F | Resp 16 | Ht 60.0 in | Wt 157.0 lb

## 2014-12-16 DIAGNOSIS — E119 Type 2 diabetes mellitus without complications: Secondary | ICD-10-CM

## 2014-12-16 DIAGNOSIS — I10 Essential (primary) hypertension: Secondary | ICD-10-CM

## 2014-12-16 DIAGNOSIS — Z0000 Encounter for general adult medical examination without abnormal findings: Secondary | ICD-10-CM

## 2014-12-16 DIAGNOSIS — E785 Hyperlipidemia, unspecified: Secondary | ICD-10-CM

## 2014-12-16 DIAGNOSIS — K589 Irritable bowel syndrome without diarrhea: Secondary | ICD-10-CM

## 2014-12-16 LAB — COMPLETE METABOLIC PANEL WITH GFR
ALK PHOS: 54 U/L (ref 39–117)
ALT: 16 U/L (ref 0–35)
AST: 19 U/L (ref 0–37)
Albumin: 4.1 g/dL (ref 3.5–5.2)
BUN: 8 mg/dL (ref 6–23)
CO2: 32 mEq/L (ref 19–32)
CREATININE: 0.67 mg/dL (ref 0.50–1.10)
Calcium: 9.4 mg/dL (ref 8.4–10.5)
Chloride: 99 mEq/L (ref 96–112)
GFR, EST NON AFRICAN AMERICAN: 89 mL/min
GFR, Est African American: >89 mL/min
Glucose, Bld: 112 mg/dL — ABNORMAL HIGH (ref 70–99)
Potassium: 4 mEq/L (ref 3.5–5.3)
Sodium: 140 mEq/L (ref 135–145)
Total Bilirubin: 0.5 mg/dL (ref 0.2–1.2)
Total Protein: 7.1 g/dL (ref 6.0–8.3)

## 2014-12-16 LAB — LIPID PANEL
Cholesterol: 192 mg/dL (ref 0–200)
HDL: 53 mg/dL (ref >39)
LDL CALC: 106 mg/dL — AB (ref 0–99)
Total CHOL/HDL Ratio: 3.6 Ratio
Triglycerides: 165 mg/dL — ABNORMAL HIGH (ref <150)
VLDL: 33 mg/dL (ref 0–40)

## 2014-12-16 LAB — POCT GLYCOSYLATED HEMOGLOBIN (HGB A1C): Hemoglobin A1C: 6.3

## 2014-12-16 MED ORDER — METFORMIN HCL ER 500 MG PO TB24: ORAL_TABLET | ORAL | Status: DC

## 2014-12-16 MED ORDER — TRIAMCINOLONE ACETONIDE 0.025 % EX CREA: 1.0000 "application " | TOPICAL_CREAM | Freq: Two times a day (BID) | CUTANEOUS | Status: DC | PRN

## 2014-12-16 MED ORDER — AMMONIUM LACTATE 12 % EX LOTN: 1.0000 "application " | TOPICAL_LOTION | CUTANEOUS | Status: DC | PRN

## 2014-12-16 MED ORDER — VSL#3 PO CAPS: 1.0000 | ORAL_CAPSULE | Freq: Two times a day (BID) | ORAL | Status: DC

## 2014-12-16 MED ORDER — RAMIPRIL 5 MG PO CAPS: 5.0000 mg | ORAL_CAPSULE | Freq: Every day | ORAL | Status: DC

## 2014-12-16 MED ORDER — LINACLOTIDE 145 MCG PO CAPS: 145.0000 ug | ORAL_CAPSULE | Freq: Every day | ORAL | Status: DC

## 2014-12-16 MED ORDER — ATORVASTATIN CALCIUM 20 MG PO TABS: 20.0000 mg | ORAL_TABLET | Freq: Every day | ORAL | Status: DC

## 2014-12-16 MED ORDER — ALBUTEROL SULFATE HFA 108 (90 BASE) MCG/ACT IN AERS: 2.0000 | INHALATION_SPRAY | Freq: Four times a day (QID) | RESPIRATORY_TRACT | Status: AC | PRN

## 2014-12-16 NOTE — Progress Notes (Addendum)
Subjective:    Patient ID: Mandy Webb, female    DOB: 1946-09-20, 68 y.o.   MRN: [id number]  HPI  This 68 y.o. [place] female is here for [facility] Subsequent annual exam. Recent evaluation with ORTHO for chronic back pain/lumbar DDD >> PT referral and prescription for meloxicam and prednisone dose pack. Pt ambulates w/ cane; no recent falls. Type II DM well controlled and pt is compliant w/ medications. FSBS: lowest = 60 but most values betw/ 90-150; no hypoglycemia. Pt has IBS and is taking probiotic recommended by her daughter; she requests RX: VSL#3 capsules twice daily. She continues to take Linzess per Dr. Ardis Hughs' management.   All medications were reviewed.  HCM: MMG- Pt declines (2013 ordered image never done).           CRS- Current (May 2015- L colon diverticulum > recall @ 5 years) per Dr. Ardis Hughs.           IMM- Tetanus needed (pt declines today).           Vision- Current.  At 04/03/2014 visit, pt was asked about Advanced Directive (Living Will); it is not on the chart but pt advised that a copy should be requested from her family.   Patient Active Problem List   Diagnosis Date Noted  . Lumbar degenerative disc disease 10/31/2014  . NEOPLASM UNCERTAIN BEHAVIOR STOMACH INTEST&RECT 07/26/2010  . NONSPECIFIC ABN FINDING RAD & OTH EXAM GI TRACT 07/26/2010  . DYSPEPSIA 05/11/2010  . COLONIC POLYPS, HX OF 03/02/2009  . CONSTIPATION 06/09/2008  . HEPATITIS C 07/29/2007  . PITUITARY ADENOMA 07/29/2007  . Hypothyroidism 07/29/2007  . DIABETES MELLITUS, TYPE II 07/29/2007  . RESTLESS LEG SYNDROME 07/29/2007  . PERIPHERAL NEUROPATHY 07/29/2007  . Essential hypertension 07/29/2007  . ASTHMA 07/29/2007  . GERD 07/29/2007    Prior to Admission medications   Medication Sig Start Date End Date Taking? Authorizing Provider  albuterol (PROVENTIL HFA;VENTOLIN HFA) 108 (90 BASE) MCG/ACT inhaler Inhale 2 puffs into the lungs every 6 (six) hours as needed for wheezing (cough, shortness of  breath or wheezing.).   Yes Barton Fanny, MD  ammonium lactate (LAC-HYDRIN) 12 % lotion Apply 1 application topically as needed for dry skin.   Yes Barton Fanny, MD  atorvastatin (LIPITOR) 20 MG tablet Take 1 tablet (20 mg total) by mouth daily.   Yes Barton Fanny, MD  Blood Glucose Monitoring Suppl (BLOOD GLUCOSE METER) kit Use as instructed 11/06/12  Yes Heather M Marte, PA-C  brimonidine (ALPHAGAN P) 0.1 % SOLN Place 1 drop into both eyes 3 (three) times daily.   Yes Historical Provider, MD  brinzolamide (AZOPT) 1 % ophthalmic suspension 1 drop 3 (three) times daily.   Yes Historical Provider, MD  carboxymethylcellulose (REFRESH PLUS) 0.5 % SOLN Place 1 drop into both eyes 4 (four) times daily. 03/25/14  Yes Barton Fanny, MD  fish oil-omega-3 fatty acids 1000 MG capsule Take 2 g by mouth daily as needed (for vitamin).    Yes Historical Provider, MD  gabapentin (NEURONTIN) 600 MG tablet TAKE ONE TABLET BY MOUTH 4 TIMES DAILY 10/21/14  Yes Barton Fanny, MD  glucose blood (ONE TOUCH ULTRA TEST) test strip Use to test blood sugar daily. Dx code: 250.00 01/19/14  Yes Mancel Bale, PA-C  glucose blood test strip Use as instructed 11/04/12  Yes Eleanore Kurtis Bushman, PA-C  Lancets [facility] ULTRASOFT) lancets Use as instructed. Dx code 250.00 patient checks blood sugar tid 11/06/12  Yes  Heather M Marte, PA-C  levothyroxine (SYNTHROID, LEVOTHROID) 88 MCG tablet Take 1 tablet (88 mcg total) by mouth daily. 10/21/14  Yes Barton Fanny, MD  Linaclotide [facility]) 145 MCG CAPS capsule Take 1 capsule (145 mcg total) by mouth daily.   Yes Barton Fanny, MD  meloxicam (MOBIC) 7.5 MG tablet Take 7.5 mg by mouth 2 (two) times daily after a meal.   Yes Historical Provider, MD  metFORMIN (GLUCOPHAGE-XR) 500 MG 24 hr tablet TAKE ONE TABLET BY MOUTH ONCE DAILY WITH BREAKFAST   Yes Barton Fanny, MD  metoCLOPramide (REGLAN) 10 MG tablet Take 1 tablet (10 mg total) by mouth  daily. 07/24/14  Yes Milus Banister, MD  montelukast (SINGULAIR) 10 MG tablet TAKE ONE TABLET BY MOUTH ONCE DAILY AT BEDTIME 03/25/14  Yes Barton Fanny, MD  Multiple Vitamins-Minerals (MULTIVITAMIN WITH MINERALS) tablet Take 1 tablet by mouth daily. One Source Advanced Adult Multi withD3   Yes Historical Provider, MD  omeprazole (PRILOSEC) 20 MG capsule TAKE ONE CAPSULE BY MOUTH ONCE DAILY   Yes Barton Fanny, MD  OVER THE COUNTER MEDICATION [place] (laxative) 450mg  as needed   Yes Historical Provider, MD  ramipril (ALTACE) 5 MG capsule Take 1 capsule (5 mg total) by mouth daily.   Yes Barton Fanny, MD  triamcinolone (KENALOG) 0.025 % cream Apply 1 application topically 2 (two) times daily as needed (for skin).   Yes Barton Fanny, MD  Probiotic Product (VSL#3) CAPS Take 1 capsule by mouth 2 (two) times daily.    Barton Fanny, MD    History   Social History  . Marital Status: Single    Spouse Name: N/A    Number of Children: 3  . Years of Education: N/A   Occupational History  . Retired    Social History Main Topics  . Smoking status: Former Smoker    Quit date: 02/02/1969  . Smokeless tobacco: Never Used  . Alcohol Use: No  . Drug Use: No  . Sexual Activity: No   Other Topics Concern  . Not on file   Social History Narrative   Lives alone;   1 daughter lives in [place]   1 son lives in [place]    Family History  Problem Relation Age of Onset  . Stomach cancer Mother   . Cancer Mother     stomach cancer  . Hypertension Father   . Heart disease Brother             Review of Systems  Constitutional: Negative.        Activity limited by arthritic and back problems; ambulates w/ a cane.  HENT: Positive for postnasal drip and tinnitus. Negative for congestion, dental problem, ear pain, hearing loss, rhinorrhea, sinus pressure, sore throat, trouble swallowing and voice change.   Eyes: Positive for visual disturbance.       Wears  corrective lenses; eye care w/ Dr. Lanell Matar.  Respiratory: Negative.   Cardiovascular: Negative.   Gastrointestinal: Negative.   Endocrine: Negative.   Genitourinary: Negative.   Musculoskeletal: Positive for back pain, arthralgias and gait problem. Negative for myalgias.       Pt recently eval by [facility] and referred to PT.  Skin: Negative.   Neurological: Negative.   Hematological: Negative.   Psychiatric/Behavioral: Negative.       Objective:   Physical Exam  Constitutional: She is oriented to person, place, and time. Vital signs are normal. She appears well-developed and well-nourished.  No distress.  HENT:  Head: Normocephalic and atraumatic.  Right Ear: Hearing, tympanic membrane, external ear and ear canal normal.  Left Ear: Hearing, tympanic membrane, external ear and ear canal normal.  Nose: Nose normal. No nasal deformity or septal deviation.  Mouth/Throat: Uvula is midline, oropharynx is clear and moist and mucous membranes are normal. No oral lesions. No uvula swelling.  Eyes: Conjunctivae, EOM and lids are normal. Pupils are equal, round, and reactive to light. No scleral icterus.  Neck: Trachea normal, normal range of motion and phonation normal. Neck supple. No JVD present. No spinous process tenderness and no muscular tenderness present. Carotid bruit is not present. No thyroid mass and no thyromegaly present.  Cardiovascular: Normal rate, regular rhythm, S1 normal, S2 normal, normal heart sounds, intact distal pulses and normal pulses.   No extrasystoles are present. PMI is not displaced.  Exam reveals no gallop and no friction rub.   No murmur heard. Pulmonary/Chest: Effort normal and breath sounds normal. No respiratory distress. Right breast exhibits no inverted nipple, no mass, no nipple discharge, no skin change and no tenderness. Left breast exhibits no inverted nipple, no mass, no nipple discharge, no skin change and no tenderness. Breasts are symmetrical.    Abdominal: Soft. Normal appearance and bowel sounds are normal. She exhibits no distension, no abdominal bruit, no pulsatile midline mass and no mass. There is no hepatosplenomegaly. There is no tenderness. There is no guarding and no CVA tenderness.  Genitourinary:  Deferred.  Musculoskeletal:       Cervical back: Normal.       Thoracic back: Normal.       Lumbar back: Normal.  Degenerative changes in hands, wrists, knees and ankles. Stiffness w/ mild deformity.  Lymphadenopathy:       Head (right side): No submental, no submandibular, no tonsillar, no preauricular, no posterior auricular and no occipital adenopathy present.       Head (left side): No submental, no submandibular, no tonsillar, no preauricular, no posterior auricular and no occipital adenopathy present.    She has no cervical adenopathy.    She has no axillary adenopathy.       Right: No inguinal and no supraclavicular adenopathy present.       Left: No inguinal and no supraclavicular adenopathy present.  Neurological: She is alert and oriented to person, place, and time. She displays no atrophy and no tremor. No cranial nerve deficit or sensory deficit. She exhibits normal muscle tone. Gait abnormal. Coordination normal.  Antalgic gait. Motor: strength normal given age.  Skin: Skin is warm, dry and intact. No ecchymosis, no lesion and no rash noted. She is not diaphoretic. No cyanosis or erythema. Nails show no clubbing.  Psychiatric: She has a normal mood and affect. Her speech is normal. Judgment and thought content normal. Cognition and memory are normal.  Nursing note and vitals reviewed.   Results for orders placed or performed in visit on 12/16/14  POCT glycosylated hemoglobin (Hb A1C)  Result Value Ref Range   Hemoglobin A1C 6.3       Assessment & Plan:  Medicare annual wellness visit, subsequent  IBS (irritable bowel syndrome)- Continue Linzess and VLS#3 probiotic 1 cap twice a day.  Essential hypertension  - Stable on current medication; continue ramipril 5 mg daily. Plan: Lipid panel  Hyperlipidemia - Plan: COMPLETE METABOLIC PANEL WITH GFR  Type II diabetes mellitus, well controlled - Pt wants to discontinue metformin; she follows a good meal plan and plans to  get more active once she completes PT. We discussed this and agree to no metformin w/ regular FSBS. Pt to contact clinic if FSBS consistently above 150 (close to 200). She voices understanding. Plan: POCT glycosylated hemoglobin (Hb A1C)   Meds ordered this encounter  Medications  . meloxicam (MOBIC) 7.5 MG tablet    Sig: Take 7.5 mg by mouth 2 (two) times daily after a meal.  . Linaclotide (LINZESS) 145 MCG CAPS capsule    Sig: Take 1 capsule (145 mcg total) by mouth daily.    Dispense:  30 capsule    Refill:  5  . triamcinolone (KENALOG) 0.025 % cream    Sig: Apply 1 application topically 2 (two) times daily as needed (for skin).    Dispense:  30 g    Refill:  3  . ammonium lactate (LAC-HYDRIN) 12 % lotion    Sig: Apply 1 application topically as needed for dry skin.    Dispense:  400 g    Refill:  2  . albuterol (PROVENTIL HFA;VENTOLIN HFA) 108 (90 BASE) MCG/ACT inhaler    Sig: Inhale 2 puffs into the lungs every 6 (six) hours as needed for wheezing (cough, shortness of breath or wheezing.).    Dispense:  1 Inhaler    Refill:  5    PLEASE FILL WITH PRO-AIR INHALER  . ramipril (ALTACE) 5 MG capsule    Sig: Take 1 capsule (5 mg total) by mouth daily.    Dispense:  90 capsule    Refill:  1  . metFORMIN (GLUCOPHAGE-XR) 500 MG 24 hr tablet    Sig: TAKE ONE TABLET BY MOUTH ONCE DAILY WITH BREAKFAST    Dispense:  90 tablet    Refill:  1  . atorvastatin (LIPITOR) 20 MG tablet    Sig: Take 1 tablet (20 mg total) by mouth daily.    Dispense:  90 tablet    Refill:  1  . Probiotic Product (VSL#3) CAPS    Sig: Take 1 capsule by mouth 2 (two) times daily.    Dispense:  60 capsule    Refill:  5

## 2014-12-16 NOTE — Patient Instructions (Addendum)
Keeping You Healthy  Get These Tests  Blood Pressure- Have your blood pressure checked by your healthcare provider at least once a year.  Normal blood pressure is 120/80.  Weight- Have your body mass index (BMI) calculated to screen for obesity.  BMI is a measure of body fat based on height and weight.  You can calculate your own BMI at [employer].it  Cholesterol- Have your cholesterol checked every year.  Diabetes- Have your blood sugar checked every year if you have high blood pressure, high cholesterol, a family history of diabetes or if you are overweight.  Pap Smear- Have a pap smear every 1 to 3 years if you have been sexually active.  If you are older than 65 and recent pap smears have been normal you may not need additional pap smears.  In addition, if you have had a hysterectomy  For benign disease additional pap smears are not necessary.  Mammogram-Yearly mammograms are essential for early detection of breast cancer  Screening for Colon Cancer- Colonoscopy starting at age 36. Screening may begin sooner depending on your family history and other health conditions.  Follow up colonoscopy as directed by your Gastroenterologist.  Screening for Osteoporosis- Screening begins at age 71 with bone density scanning, sooner if you are at higher risk for developing Osteoporosis.  Get these medicines  Calcium with Vitamin D- Your body requires 1200-1500 mg of Calcium a day and [phone] IU of Vitamin D a day.  You can only absorb 500 mg of Calcium at a time therefore Calcium must be taken in 2 or 3 separate doses throughout the day.  Hormones- Hormone therapy has been associated with increased risk for certain cancers and heart disease.  Talk to your healthcare provider about if you need relief from menopausal symptoms.  Aspirin- Ask your healthcare provider about taking Aspirin to prevent Heart Disease and Stroke.  Get these Immuniztions  Flu shot- Every fall  Pneumonia  shot- Once after the age of 33; if you are younger ask your healthcare provider if you need a pneumonia shot.  Tetanus- Every ten years.  Our records show that you need this vaccine; Medicare does not cover Tdap which is recommended for one dose as an adult.  Zostavax- Once after the age of 20 to prevent shingles.  Take these steps  Don't smoke- Your healthcare provider can help you quit. For tips on how to quit, ask your healthcare provider or go to www.smokefree.gov or call 1-800 QUIT-NOW.  Be physically active- Exercise 5 days a week for a minimum of 30 minutes.  If you are not already physically active, start slow and gradually work up to 30 minutes of moderate physical activity.  Try walking, dancing, bike riding, swimming, etc.  Eat a healthy diet- Eat a variety of healthy foods such as fruits, vegetables, whole grains, low fat milk, low fat cheeses, yogurt, lean meats, chicken, fish, eggs, dried beans, tofu, etc.  For more information go to www.thenutritionsource.org  Dental visit- Brush and floss teeth twice daily; visit your dentist twice a year.  Eye exam- Visit your Optometrist or Ophthalmologist yearly.  Drink alcohol in moderation- Limit alcohol intake to one drink or less a day.  Never drink and drive.  Depression- Your emotional health is as important as your physical health.  If you're feeling down or losing interest in things you normally enjoy, please talk to your healthcare provider.  Seat Belts- can save your life; always wear one  Smoke/Carbon Monoxide detectors- These detectors  need to be installed on the appropriate level of your home.  Replace batteries at least once a year.  Violence- If anyone is threatening or hurting you, please tell your healthcare provider.  Living Will/ Health care power of attorney- Discuss with your healthcare provider and family.   I will call you with the result of the A1c test (Diabetes control number).  I want to see you again in 4  months. Take care... Let us know if you need anything. Good luck with physical therapy.

## 2014-12-21 ENCOUNTER — Telehealth: Payer: Self-pay | Admitting: *Deleted

## 2014-12-21 NOTE — Telephone Encounter (Signed)
Phoned Dr. Lysle Dingwall office and Shirlean Mylar is faxing a copy of patient's 2015 DEE.

## 2014-12-22 NOTE — Telephone Encounter (Signed)
Received fax from Dr. 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  Health maintenance and provider updated.

## 2014-12-23 NOTE — Progress Notes (Signed)
Quick Note:  Please advise pt regarding following labs... Recent labs look stable. Blood sugar was a little above normal but other values are normal. Continue current medications and healthy nutrition, Try ti get some regular exercise (walking) most days of the week.   Copy to pt. ______

## 2015-01-04 ENCOUNTER — Telehealth: Payer: Self-pay

## 2015-01-04 NOTE — Telephone Encounter (Signed)
McPherson - Pt says she needs a refill on her atorvastatin.  She says she only has a few pills left and that they wont refill it.  [phone]

## 2015-01-05 ENCOUNTER — Telehealth: Payer: Self-pay

## 2015-01-05 NOTE — Telephone Encounter (Signed)
Pt states she has stop taking her metFORMIN (GLUCOPHAGE-XR) 500 MG 24 hr tablet [[id number]] and everything is going well. She would like to know how many times a day she should prick her finger. She says it is starting hurt. Please advise at  [phone]

## 2015-01-05 NOTE — Telephone Encounter (Signed)
Called in Rx again that was sent on 12/16/14 and notified pt.

## 2015-01-07 NOTE — Telephone Encounter (Signed)
How often should she check her sugars.

## 2015-01-07 NOTE — Telephone Encounter (Signed)
I am pleased to hear that blood sugars are stable off the medication. She can check blood sugar 3-4 times a week (best to fasting morning sugar). Also check when she is not feeling well or having a "sick" day.

## 2015-01-10 NOTE — Telephone Encounter (Signed)
Pt has been given instructions and understands.

## 2015-01-25 ENCOUNTER — Other Ambulatory Visit: Payer: Self-pay | Admitting: Radiology

## 2015-01-25 DIAGNOSIS — H25813 Combined forms of age-related cataract, bilateral: Secondary | ICD-10-CM | POA: Diagnosis not present

## 2015-01-25 DIAGNOSIS — I6523 Occlusion and stenosis of bilateral carotid arteries: Secondary | ICD-10-CM

## 2015-01-25 DIAGNOSIS — E11319 Type 2 diabetes mellitus with unspecified diabetic retinopathy without macular edema: Secondary | ICD-10-CM | POA: Diagnosis not present

## 2015-01-25 DIAGNOSIS — H4011X3 Primary open-angle glaucoma, severe stage: Secondary | ICD-10-CM | POA: Diagnosis not present

## 2015-01-25 DIAGNOSIS — H35039 Hypertensive retinopathy, unspecified eye: Secondary | ICD-10-CM | POA: Diagnosis not present

## 2015-01-27 ENCOUNTER — Other Ambulatory Visit: Payer: Self-pay | Admitting: Dermatology

## 2015-01-27 DIAGNOSIS — L920 Granuloma annulare: Secondary | ICD-10-CM | POA: Diagnosis not present

## 2015-01-27 DIAGNOSIS — D485 Neoplasm of uncertain behavior of skin: Secondary | ICD-10-CM | POA: Diagnosis not present

## 2015-02-01 ENCOUNTER — Encounter (HOSPITAL_COMMUNITY): Payer: Medicaid Other

## 2015-02-02 ENCOUNTER — Telehealth: Payer: Self-pay

## 2015-02-02 DIAGNOSIS — E119 Type 2 diabetes mellitus without complications: Secondary | ICD-10-CM

## 2015-02-02 NOTE — Telephone Encounter (Signed)
Pt called wanting to speak a "diabetic educator". I told her any doctor could see and talk with her. She wants a "diabetic educator". Please advise at [phone]

## 2015-02-03 ENCOUNTER — Telehealth: Payer: Self-pay

## 2015-02-03 NOTE — Telephone Encounter (Signed)
Messages already sent to Dr. Leward Quan. Awaiting response.

## 2015-02-03 NOTE — Telephone Encounter (Signed)
Diabetes education and nutrition counseling ordered.

## 2015-02-03 NOTE — Telephone Encounter (Signed)
Spoke with pt she would like to be referred to a diabetes educator so she can learn how to control her diabetes through diet. She states she stopped her Metformin but feels her glucose is a little high after eating and is afraid she may need to take the metformin. Can we refer?

## 2015-02-03 NOTE — Telephone Encounter (Signed)
Mandy Webb is speaking with her now. It was regarding a CB

## 2015-02-08 ENCOUNTER — Ambulatory Visit (HOSPITAL_COMMUNITY): Payer: Medicare Other | Attending: Family Medicine | Admitting: Cardiology

## 2015-02-08 DIAGNOSIS — I6523 Occlusion and stenosis of bilateral carotid arteries: Secondary | ICD-10-CM | POA: Diagnosis not present

## 2015-02-08 NOTE — Progress Notes (Signed)
Carotid duplex performed 

## 2015-02-10 ENCOUNTER — Ambulatory Visit (INDEPENDENT_AMBULATORY_CARE_PROVIDER_SITE_OTHER): Payer: Medicare Other | Admitting: Podiatry

## 2015-02-10 ENCOUNTER — Encounter: Payer: Self-pay | Admitting: Podiatry

## 2015-02-10 DIAGNOSIS — M79676 Pain in unspecified toe(s): Secondary | ICD-10-CM

## 2015-02-10 DIAGNOSIS — B351 Tinea unguium: Secondary | ICD-10-CM

## 2015-02-10 NOTE — Patient Instructions (Signed)
Diabetes and Foot Care Diabetes may cause you to have problems because of poor blood supply (circulation) to your feet and legs. This may cause the skin on your feet to become thinner, break easier, and heal more slowly. Your skin may become dry, and the skin may peel and crack. You may also have nerve damage in your legs and feet causing decreased feeling in them. You may not notice minor injuries to your feet that could lead to infections or more serious problems. Taking care of your feet is one of the most important things you can do for yourself.  HOME CARE INSTRUCTIONS  Wear shoes at all times, even in the house. Do not go barefoot. Bare feet are easily injured.  Check your feet daily for blisters, cuts, and redness. If you cannot see the bottom of your feet, use a mirror or ask someone for help.  Wash your feet with warm water (do not use hot water) and mild soap. Then pat your feet and the areas between your toes until they are completely dry. Do not soak your feet as this can dry your skin.  Apply a moisturizing lotion or petroleum jelly (that does not contain alcohol and is unscented) to the skin on your feet and to dry, brittle toenails. Do not apply lotion between your toes.  Trim your toenails straight across. Do not dig under them or around the cuticle. File the edges of your nails with an emery board or nail file.  Do not cut corns or calluses or try to remove them with medicine.  Wear clean socks or stockings every day. Make sure they are not too tight. Do not wear knee-high stockings since they may decrease blood flow to your legs.  Wear shoes that fit properly and have enough cushioning. To break in new shoes, wear them for just a few hours a day. This prevents you from injuring your feet. Always look in your shoes before you put them on to be sure there are no objects inside.  Do not cross your legs. This may decrease the blood flow to your feet.  If you find a minor scrape,  cut, or break in the skin on your feet, keep it and the skin around it clean and dry. These areas may be cleansed with mild soap and water. Do not cleanse the area with peroxide, alcohol, or iodine.  When you remove an adhesive bandage, be sure not to damage the skin around it.  If you have a wound, look at it several times a day to make sure it is healing.  Do not use heating pads or hot water bottles. They may burn your skin. If you have lost feeling in your feet or legs, you may not know it is happening until it is too late.  Make sure your health care provider performs a complete foot exam at least annually or more often if you have foot problems. Report any cuts, sores, or bruises to your health care provider immediately. SEEK MEDICAL CARE IF:   You have an injury that is not healing.  You have cuts or breaks in the skin.  You have an ingrown nail.  You notice redness on your legs or feet.  You feel burning or tingling in your legs or feet.  You have pain or cramps in your legs and feet.  Your legs or feet are numb.  Your feet always feel cold. SEEK IMMEDIATE MEDICAL CARE IF:   There is increasing redness,   swelling, or pain in or around a wound.  There is a red line that goes up your leg.  Pus is coming from a wound.  You develop a fever or as directed by your health care provider.  You notice a bad smell coming from an ulcer or wound. Document Released: 12/01/2000 Document Revised: 08/06/2013 Document Reviewed: 05/13/2013 [employer] Patient Information 2015 [employer]. This information is not intended to replace advice given to you by your health care provider. Make sure you discuss any questions you have with your health care provider.  

## 2015-02-10 NOTE — Progress Notes (Signed)
Patient ID: Mandy Webb, female   DOB: Dec 09, 1946, 68 y.o.   MRN: [id number]  Subjective: This patient presents again complaining of painful toenails and requests debridement  Objective: The toenails are elongated incurvated discolored 1-4 bilaterally and tender to direct palpation  Assessment: Symptomatic onychomycoses 8 History of diabetic peripheral neuropathy  Plan: Debridement toenails 8 without any bleeding  Reappoint 3 months

## 2015-02-11 ENCOUNTER — Telehealth: Payer: Self-pay | Admitting: *Deleted

## 2015-02-11 MED ORDER — ONETOUCH ULTRASOFT LANCETS MISC: Status: DC

## 2015-02-11 MED ORDER — GLUCOSE BLOOD VI STRP: ORAL_STRIP | Status: DC

## 2015-02-11 NOTE — Telephone Encounter (Signed)
Pt needs RF on lancets and test strips. D/w pt testing schedule and she reported her BS has been running between 105 - 129 different times of the day since she stopped metformin per Dr McPherson's instr's at last [place]. Sent RFs.

## 2015-02-11 NOTE — Telephone Encounter (Signed)
Pt called stating she is in need for test strips.  The last time she had some ordered was 11/04/2012.  Pt states she has an appt next month with Dr. Leward Quan.  She states she only has an few left and needs them today.

## 2015-02-12 ENCOUNTER — Telehealth: Payer: Self-pay

## 2015-02-12 NOTE — Telephone Encounter (Signed)
Dr. Leward Quan I spoke with Pamala Hurry and she stated Medicare will only cover Rx if tested once daily. I called the pharmacy and advised the pharmacist to change the directions on the Rx. Disregard this message. Thanks.

## 2015-02-12 NOTE — Telephone Encounter (Signed)
Pharmacy called requesting a new Rx for pt's test strips. They need to know specifically how many times per day she needs to test her blood sugar.    glucose blood (ONE TOUCH ULTRA TEST) test strip [[id number]]      Order Details    Dose, Route, Frequency: As Directed    Dispense Quantity:  100 each Refills:  2 Fills Remaining:  2          Sig: Use to test blood sugar daily. Dx code: E11.9         Written Date:  02/11/15 Expiration Date:  02/11/16     Start Date:  02/11/15 End Date:  --     Ordering Provider:  Barton Fanny, MD Authorizing Provider:  Barton Fanny, MD Ordering User:  Dallas Schimke, RN

## 2015-02-14 ENCOUNTER — Telehealth: Payer: Self-pay | Admitting: Family Medicine

## 2015-02-14 NOTE — Telephone Encounter (Signed)
Please call pt about her recent study of the carotid arteries (arteries in the neck that supply the brain)- there is plaque in the arteries; the left side has a little more than the right side but blood flow is good on both sides. This study should be repeated in 1 year.

## 2015-02-16 NOTE — Telephone Encounter (Signed)
No answer no machine

## 2015-02-16 NOTE — Telephone Encounter (Signed)
I spoke with pt and gave her the results of the carotid doppler study. Also advised her that it needs to be repeated in 12 months. She sees DM educator next week; FSBS 90- 120, off medication.

## 2015-02-18 ENCOUNTER — Other Ambulatory Visit: Payer: Self-pay | Admitting: Physician Assistant

## 2015-02-24 ENCOUNTER — Encounter: Payer: Medicare Other | Attending: Family Medicine | Admitting: *Deleted

## 2015-02-24 ENCOUNTER — Encounter: Payer: Self-pay | Admitting: *Deleted

## 2015-02-24 VITALS — Ht 60.0 in

## 2015-02-24 DIAGNOSIS — Z713 Dietary counseling and surveillance: Secondary | ICD-10-CM | POA: Insufficient documentation

## 2015-02-24 DIAGNOSIS — E119 Type 2 diabetes mellitus without complications: Secondary | ICD-10-CM | POA: Diagnosis not present

## 2015-02-24 NOTE — Patient Instructions (Addendum)
Plan:  Aim for 2-3 Carb Choices per meal (30-45 grams) +/- 1 either way  Aim for 0-15 Carbs per snack if hungry  Include protein in moderation with your meals and snacks Consider reading food labels for Total Carbohydrate and Fat Grams of foods Consider  increasing your activity level by water aerobics or chair exercises daily as tolerated Consider checking BG at alternate times per day as directed by MD . One day do fasting the next do 2 hours after a meal. Alternate this through the week. It is not necessary to check more than one time per day. continue taking medication as directed by MD  [place] and drained fruit Pre boil some eggs to have in refrigerator as good protein source Activia Light is good for breakfast or snack

## 2015-02-24 NOTE — Progress Notes (Signed)
Diabetes Self-Management Education  Visit Type:  Initial DSME  Appt. Start Time: 0800 Appt. End Time: 0930  02/24/2015  [name], identified by name and date of birth, is a 69 y.o. female with a diagnosis of Diabetes: Type 2.  Other people present during visit:  Patient Mandy Webb lives alone. She does her own grocery shopping and food preparation. She is very active in the [employer] and attends exercise classes 3 times weekly. She has a long time friend that has been guiding her with dietary recommendations. Her recommendations have been quite accurate.  ASSESSMENT  Height 5' (1.524 m). There is no weight on file to calculate BMI.  Initial Visit Information:  Are you currently following a meal plan?: No Are you taking your medications as prescribed?: Yes Are you checking your feet?: Yes (goes to podiatrist every 3 months) How often do you need to have someone help you when you read instructions, pamphlets, or other written materials from your doctor or pharmacy?: 1 - Never   Psychosocial:   Patient Belief/Attitude about Diabetes: Motivated to manage diabetes (wants to do what is necessary to stay off medication) Self-care barriers: English as a second language Self-management support: Doctor's office, Friends, CDE visits Other persons present: Patient Patient Concerns: Nutrition/Meal planning Special Needs: Other (comment) (speak slowly to assure patient understands information) Preferred Learning Style: No preference indicated Learning Readiness: Change in progress  Complications:   How often do you check your blood sugar?: 1-2 times/day Fasting Blood glucose range (mg/dL): 70-129 Postprandial Blood glucose range (mg/dL): 70-129, 130-179 (82-142mg /dl) Have you had a dilated eye exam in the past 12 months?: Yes Have you had a dental exam in the past 12 months?: Yes  Diet Intake:  Breakfast: yogurt, coffee / boiled egg, coffee/ instant oatmeal, almond milk /  eggs, grits, toast, coffe Snack (morning): none Lunch: mashed potatoes, meatballs, brocolli,  Snack (afternoon): coffee, Dinner: pizza X2 /  frozen meals/ rice, salmon, black or red beans/  Snack (evening): peanutbutter crackers Beverage(s): coffe, splenda, cream/ water / almond milk  Exercise:  Exercise: Moderate (swimming / aerobic walking), Light (walking / raking leaves) (goes to water aerobics 1X weekly and does chair exercises at [facility] 2-3 times per week) Light Exercise amount of time (min / week): 150  Individualized Plan for Diabetes Self-Management Training:   Learning Objective:  Patient will have a greater understanding of diabetes self-management. Patient education plan per assessed needs and concerns is to attend individual sessions     Education Topics Reviewed with Patient Today:  Factors that contribute to the development of diabetes Role of diet in the treatment of diabetes and the relationship between the three main macronutrients and blood glucose level, Information on hints to eating out and maintain blood glucose control., Meal options for control of blood glucose level and chronic complications. Role of exercise on diabetes management, blood pressure control and cardiac health. (Patient is active in [employer]. They have someone come in to do chair exercises with them 2X weekly. they also go and do water aerobics weekly) Reviewed patients medication for diabetes, action, purpose, timing of dose and side effects. Purpose and frequency of SMBG. Taught treatment of hypoglycemia - the 15 rule. Relationship between chronic complications and blood glucose control, Assessed and discussed foot care and prevention of foot problems, Dental care, Identified and discussed with patient  current chronic complications Role of stress on diabetes  PATIENTS GOALS/Plan (Developed by the patient):  Nutrition: General guidelines for  healthy choices and portions  discussed Physical Activity: Exercise 5-7 days per week Monitoring : test my blood glucose as discussed (note x per day with comment) Reducing Risk: do foot checks daily, treat hypoglycemia with 15 grams of carbs if blood glucose less than 70mg /dL   Patient Instructions  Plan:  Aim for 2-3 Carb Choices per meal (30-45 grams) +/- 1 either way  Aim for 0-15 Carbs per snack if hungry  Include protein in moderation with your meals and snacks Consider reading food labels for Total Carbohydrate and Fat Grams of foods Consider  increasing your activity level by water aerobics or chair exercises daily as tolerated Consider checking BG at alternate times per day as directed by MD . One day do fasting the next do 2 hours after a meal. Alternate this through the week. It is not necessary to check more than one time per day. continue taking medication as directed by MD [place] and drained fruit Pre boil some eggs to have in refrigerator as good protein source Activia Light is good for breakfast or snack  Expected Outcomes:  Demonstrated interest in learning. Expect positive outcomes  Education material provided: Living Well with Diabetes, Meal plan card, My Plate and Snack sheet  If problems or questions, patient to contact team via:  Phone  Future DSME appointment: PRN

## 2015-03-11 ENCOUNTER — Telehealth: Payer: Self-pay | Admitting: Physician Assistant

## 2015-03-11 NOTE — Telephone Encounter (Signed)
Called to confirm her appointment on 4/05 with Dr. Leward Quan.  I noted that she also has 2 MRNs. Ticket placed to merge.

## 2015-03-23 ENCOUNTER — Encounter: Payer: Self-pay | Admitting: Family Medicine

## 2015-03-23 ENCOUNTER — Ambulatory Visit (INDEPENDENT_AMBULATORY_CARE_PROVIDER_SITE_OTHER): Payer: Medicare Other | Admitting: Family Medicine

## 2015-03-23 VITALS — BP 186/86 | HR 71 | Temp 98.1°F | Resp 16 | Ht 59.25 in | Wt 153.6 lb

## 2015-03-23 DIAGNOSIS — E034 Atrophy of thyroid (acquired): Secondary | ICD-10-CM

## 2015-03-23 DIAGNOSIS — E038 Other specified hypothyroidism: Secondary | ICD-10-CM | POA: Diagnosis not present

## 2015-03-23 DIAGNOSIS — I10 Essential (primary) hypertension: Secondary | ICD-10-CM

## 2015-03-23 DIAGNOSIS — Z8639 Personal history of other endocrine, nutritional and metabolic disease: Secondary | ICD-10-CM

## 2015-03-23 LAB — BASIC METABOLIC PANEL
BUN: 9 mg/dL (ref 6–23)
CO2: 30 meq/L (ref 19–32)
Calcium: 9.2 mg/dL (ref 8.4–10.5)
Chloride: 101 mEq/L (ref 96–112)
Creat: 0.67 mg/dL (ref 0.50–1.10)
Glucose, Bld: 100 mg/dL — ABNORMAL HIGH (ref 70–99)
POTASSIUM: 4.4 meq/L (ref 3.5–5.3)
SODIUM: 142 meq/L (ref 135–145)

## 2015-03-23 LAB — THYROID PANEL WITH TSH
Free Thyroxine Index: 2.6 (ref 1.4–3.8)
T3 UPTAKE: 31 % (ref 22–35)
T4 TOTAL: 8.3 ug/dL (ref 4.5–12.0)
TSH: 0.284 u[IU]/mL — AB (ref 0.350–4.500)

## 2015-03-23 LAB — LDL CHOLESTEROL, DIRECT: LDL DIRECT: 89 mg/dL

## 2015-03-23 NOTE — Patient Instructions (Addendum)
You are doing well off Metformin. Continue the good self-care. You will be notified about your lab results within the next 2 days.  I am retiring at the end of May 2016. I would like for you to continue your care here with Dr. Marin Comment. She sees patients on Fridays.  If you have any needs before the end of May, please contact the clinic.

## 2015-03-24 ENCOUNTER — Telehealth: Payer: Self-pay | Admitting: Family Medicine

## 2015-03-24 DIAGNOSIS — I779 Disorder of arteries and arterioles, unspecified: Secondary | ICD-10-CM | POA: Insufficient documentation

## 2015-03-24 DIAGNOSIS — I739 Peripheral vascular disease, unspecified: Secondary | ICD-10-CM

## 2015-03-24 LAB — HEMOGLOBIN A1C
Hgb A1c MFr Bld: 5.8 % — ABNORMAL HIGH (ref <5.7)
MEAN PLASMA GLUCOSE: 120 mg/dL — AB (ref <117)

## 2015-03-24 MED ORDER — LEVOTHYROXINE SODIUM 75 MCG PO TABS: 75.0000 ug | ORAL_TABLET | Freq: Every day | ORAL | Status: DC

## 2015-03-24 NOTE — Progress Notes (Signed)
S:  This 69 y.o. Female has Type II DM, currently off oral medications and using nutrition management. FSBS 2-3 times daily. Log indicates values 80- 140; no hypoglycemic symptoms. Pt also has hypothyroidism and is compliant with medication; no reports of fatigue, diaphoresis, abnormal weight change, vision changes, CP or tightness, palpitations, GI problems, edema, HA, dizziness, weakness or syncope. She is compliant w/ BP medication, w/o  adverse effects. Home monitoring >> 140-150/70-90. Weight is down 4 lbs.  Patient Active Problem List   Diagnosis Date Noted  . Lumbar degenerative disc disease 10/31/2014  . NEOPLASM UNCERTAIN BEHAVIOR STOMACH INTEST&RECT 07/26/2010  . NONSPECIFIC ABN FINDING RAD & OTH EXAM GI TRACT 07/26/2010  . DYSPEPSIA 05/11/2010  . COLONIC POLYPS, HX OF 03/02/2009  . CONSTIPATION 06/09/2008  . HEPATITIS C 07/29/2007  . PITUITARY ADENOMA 07/29/2007  . Hypothyroidism 07/29/2007  . DIABETES MELLITUS, TYPE II 07/29/2007  . RESTLESS LEG SYNDROME 07/29/2007  . PERIPHERAL NEUROPATHY 07/29/2007  . Essential hypertension 07/29/2007  . ASTHMA 07/29/2007  . GERD 07/29/2007    Prior to Admission medications   Medication Sig Start Date End Date Taking? Authorizing Provider  albuterol (PROVENTIL HFA;VENTOLIN HFA) 108 (90 BASE) MCG/ACT inhaler Inhale 2 puffs into the lungs every 6 (six) hours as needed for wheezing (cough, shortness of breath or wheezing.). 12/16/14  Yes Barton Fanny, MD  ammonium lactate (LAC-HYDRIN) 12 % lotion Apply 1 application topically as needed for dry skin. 12/16/14  Yes Barton Fanny, MD  atorvastatin (LIPITOR) 20 MG tablet Take 1 tablet (20 mg total) by mouth daily. 12/16/14  Yes Barton Fanny, MD  Blood Glucose Monitoring Suppl (BLOOD GLUCOSE METER) kit Use as instructed 11/06/12  Yes Heather M Marte, PA-C  brimonidine (ALPHAGAN P) 0.1 % SOLN Place 1 drop into both eyes 3 (three) times daily.   Yes Historical Provider, MD   brinzolamide (AZOPT) 1 % ophthalmic suspension 1 drop 3 (three) times daily.   Yes Historical Provider, MD  carboxymethylcellulose (REFRESH PLUS) 0.5 % SOLN Place 1 drop into both eyes 4 (four) times daily. 03/25/14  Yes Barton Fanny, MD  fish oil-omega-3 fatty acids 1000 MG capsule Take 2 g by mouth daily as needed (for vitamin).    Yes Historical Provider, MD  gabapentin (NEURONTIN) 600 MG tablet TAKE ONE TABLET BY MOUTH 4 TIMES DAILY 10/21/14  Yes Barton Fanny, MD  glucose blood (ONE TOUCH ULTRA TEST) test strip Use to test blood sugar daily. Dx code: E11.9 02/11/15  Yes Barton Fanny, MD  Lancets [facility] ULTRASOFT) lancets Test blood sugar daily. Dx code: E11.9 02/11/15  Yes Barton Fanny, MD  levothyroxine (SYNTHROID, LEVOTHROID) 88 MCG tablet Take 1 tablet (88 mcg total) by mouth daily. 10/21/14  Yes Barton Fanny, MD  Linaclotide [facility]) 145 MCG CAPS capsule Take 1 capsule (145 mcg total) by mouth daily. 12/16/14  Yes Barton Fanny, MD  meloxicam (MOBIC) 7.5 MG tablet Take 7.5 mg by mouth 2 (two) times daily after a meal.   Yes Historical Provider, MD  metoCLOPramide (REGLAN) 10 MG tablet Take 1 tablet (10 mg total) by mouth daily. 07/24/14  Yes Milus Banister, MD  montelukast (SINGULAIR) 10 MG tablet TAKE ONE TABLET BY MOUTH ONCE DAILY AT BEDTIME 03/25/14  Yes Barton Fanny, MD  Multiple Vitamins-Minerals (MULTIVITAMIN WITH MINERALS) tablet Take 1 tablet by mouth daily. One Source Advanced Adult Multi withD3   Yes Historical Provider, MD  omeprazole (PRILOSEC) 20 MG capsule TAKE ONE  CAPSULE BY MOUTH ONCE DAILY   Yes Barton Fanny, MD  OVER THE COUNTER MEDICATION [place] (laxative) 450mg  as needed   Yes Historical Provider, MD  Probiotic Product (VSL#3) CAPS Take 1 capsule by mouth 2 (two) times daily. 12/16/14  Yes Barton Fanny, MD  ramipril (ALTACE) 5 MG capsule Take 1 capsule (5 mg total) by mouth daily. 12/16/14  Yes Barton Fanny, MD  triamcinolone (KENALOG) 0.025 % cream Apply 1 application topically 2 (two) times daily as needed (for skin). 12/16/14  Yes Barton Fanny, MD    Past Surgical History  Procedure Laterality Date  . Appendectomy    . Pituitary surgery    . Tubal ligation    . Abdominal hysterectomy      SOC and FAM HX reviewed.  ROS: As per HPI.  O: Filed Vitals:   03/23/15 1459  BP: 186/86  Pulse: 71  Temp: 98.1 F (36.7 C)  Resp: 16    GEN: In NAD; WN,WD. HENT: [place]/AT; EOMI w/ clear conj/sclerae. Otherwise unremarkable. COR: RRR. LUNGS: Normal resp rate and effort. SKIN; W&D; intact. NEURO: A&O x 3; CNs intact. Gait- antalgic w/ gait. Otherwise, at baseline.  CAROTID DUPLEX (02/09/2015): Heterogeneous plaque bilaterally. Stable 1-39% RICA stenosis. Progression of LICA stenosis, now 47-53%. Normal subclavian arteries bilaterally. Patent vertebral arteries with antegrade flow.  Follow-up in 1 year.  A/P: History of diabetes mellitus, type II - Pt can remain off oral DM agent; continue appropriate nutrition and regular physical activity. Plan: Hemoglobin A1C, LDL cholesterol, direct  Hypothyroidism due to acquired atrophy of thyroid - continue current medication pending lab result. Plan: Thyroid Panel With TSH  Essential hypertension - Pt's home measurements confirm adequate medications. Plan: Basic metabolic panel

## 2015-03-24 NOTE — Telephone Encounter (Signed)
Pt was called and given A1c results.  Pt was pleased to hear.  Pt was also advised of instructions on stopping current medication and picking up new meds at pharmacy.  Pt understood  Pt was also advised of upcoming appt in Sept 2016 with Dr. Marin Comment

## 2015-03-24 NOTE — Progress Notes (Signed)
Quick Note:  Please advise pt regarding following labs...  Thyroid medication dose has been decreased to 75 micrograms. Stop current thyroid medication. New medication is at your pharmacy for pick up.   Copy of results to pt. ______

## 2015-03-24 NOTE — Telephone Encounter (Signed)
Phoned pt to give results of A1c= 5.8%; better than last time. Doing well off metformin. Thyroid results indicate that medication dose needs to be reduced just a little. Current dose is 88 mcg daily. I am reducing dose to 75 mcg daily. This refill has been routed to pharmacy. I will have clinical message pool notify pt.

## 2015-03-25 ENCOUNTER — Telehealth: Payer: Self-pay

## 2015-03-25 DIAGNOSIS — Z1239 Encounter for other screening for malignant neoplasm of breast: Secondary | ICD-10-CM

## 2015-03-25 NOTE — Telephone Encounter (Signed)
Patient would like a referral for a mammogram. Please call!

## 2015-03-25 NOTE — Telephone Encounter (Signed)
Dr Leward Quan-- Can referral be placed for mammo?

## 2015-03-28 NOTE — Telephone Encounter (Signed)
MMG ordered >> [facility].

## 2015-04-08 DIAGNOSIS — E039 Hypothyroidism, unspecified: Secondary | ICD-10-CM | POA: Diagnosis not present

## 2015-04-08 DIAGNOSIS — E1165 Type 2 diabetes mellitus with hyperglycemia: Secondary | ICD-10-CM | POA: Diagnosis not present

## 2015-04-08 DIAGNOSIS — E780 Pure hypercholesterolemia: Secondary | ICD-10-CM | POA: Diagnosis not present

## 2015-04-08 DIAGNOSIS — D443 Neoplasm of uncertain behavior of pituitary gland: Secondary | ICD-10-CM | POA: Diagnosis not present

## 2015-04-13 ENCOUNTER — Ambulatory Visit (HOSPITAL_COMMUNITY): Payer: Medicare Other

## 2015-04-13 ENCOUNTER — Other Ambulatory Visit: Payer: Self-pay | Admitting: Family Medicine

## 2015-04-13 ENCOUNTER — Ambulatory Visit (HOSPITAL_COMMUNITY)
Admission: RE | Admit: 2015-04-13 | Discharge: 2015-04-13 | Disposition: A | Discharge status: Home / Self Care | Payer: Medicare Other | Source: Ambulatory Visit | Attending: Family Medicine | Admitting: Family Medicine

## 2015-04-13 DIAGNOSIS — Z1231 Encounter for screening mammogram for malignant neoplasm of breast: Secondary | ICD-10-CM | POA: Insufficient documentation

## 2015-04-13 DIAGNOSIS — Z1239 Encounter for other screening for malignant neoplasm of breast: Secondary | ICD-10-CM

## 2015-04-14 DIAGNOSIS — I10 Essential (primary) hypertension: Secondary | ICD-10-CM | POA: Diagnosis not present

## 2015-04-14 DIAGNOSIS — G609 Hereditary and idiopathic neuropathy, unspecified: Secondary | ICD-10-CM | POA: Diagnosis not present

## 2015-04-14 DIAGNOSIS — E780 Pure hypercholesterolemia: Secondary | ICD-10-CM | POA: Diagnosis not present

## 2015-04-14 DIAGNOSIS — E1165 Type 2 diabetes mellitus with hyperglycemia: Secondary | ICD-10-CM | POA: Diagnosis not present

## 2015-04-14 DIAGNOSIS — D443 Neoplasm of uncertain behavior of pituitary gland: Secondary | ICD-10-CM | POA: Diagnosis not present

## 2015-04-14 DIAGNOSIS — E039 Hypothyroidism, unspecified: Secondary | ICD-10-CM | POA: Diagnosis not present

## 2015-04-16 ENCOUNTER — Ambulatory Visit: Payer: PRIVATE HEALTH INSURANCE | Admitting: Family Medicine

## 2015-04-23 ENCOUNTER — Ambulatory Visit (INDEPENDENT_AMBULATORY_CARE_PROVIDER_SITE_OTHER): Payer: Medicare Other

## 2015-04-23 DIAGNOSIS — M79673 Pain in unspecified foot: Secondary | ICD-10-CM | POA: Diagnosis not present

## 2015-04-23 DIAGNOSIS — E1140 Type 2 diabetes mellitus with diabetic neuropathy, unspecified: Secondary | ICD-10-CM

## 2015-04-23 DIAGNOSIS — B351 Tinea unguium: Secondary | ICD-10-CM

## 2015-04-23 NOTE — Progress Notes (Signed)
Patient ID: Mandy Webb, female   DOB: 29-Mar-1946, 69 y.o.   MRN: [id number]  Subjective: This patient presents again complaining of painful toenails and requests debridement  Objective: The toenails are elongated incurvated discolored 1-4 bilaterally and tender to direct palpation  Assessment: Symptomatic onychomycoses 8 History of diabetic peripheral neuropathy  Plan: Debridement toenails 8 without any bleeding  Reappoint 3 months

## 2015-05-11 ENCOUNTER — Ambulatory Visit: Payer: Medicare Other

## 2015-05-12 ENCOUNTER — Emergency Department (HOSPITAL_COMMUNITY)
Admission: EM | Admit: 2015-05-12 | Discharge: 2015-05-12 | Disposition: A | Discharge status: Home / Self Care | Payer: Medicare Other | Attending: Emergency Medicine | Admitting: Emergency Medicine

## 2015-05-12 ENCOUNTER — Encounter (HOSPITAL_COMMUNITY): Payer: Self-pay | Admitting: Emergency Medicine

## 2015-05-12 ENCOUNTER — Ambulatory Visit: Payer: Medicare Other | Admitting: Podiatry

## 2015-05-12 DIAGNOSIS — Z79899 Other long term (current) drug therapy: Secondary | ICD-10-CM | POA: Insufficient documentation

## 2015-05-12 DIAGNOSIS — E785 Hyperlipidemia, unspecified: Secondary | ICD-10-CM | POA: Diagnosis not present

## 2015-05-12 DIAGNOSIS — I10 Essential (primary) hypertension: Secondary | ICD-10-CM

## 2015-05-12 DIAGNOSIS — Z8739 Personal history of other diseases of the musculoskeletal system and connective tissue: Secondary | ICD-10-CM | POA: Insufficient documentation

## 2015-05-12 DIAGNOSIS — R531 Weakness: Secondary | ICD-10-CM | POA: Diagnosis not present

## 2015-05-12 DIAGNOSIS — R404 Transient alteration of awareness: Secondary | ICD-10-CM | POA: Diagnosis not present

## 2015-05-12 DIAGNOSIS — E119 Type 2 diabetes mellitus without complications: Secondary | ICD-10-CM | POA: Insufficient documentation

## 2015-05-12 DIAGNOSIS — Z794 Long term (current) use of insulin: Secondary | ICD-10-CM | POA: Insufficient documentation

## 2015-05-12 DIAGNOSIS — Z87891 Personal history of nicotine dependence: Secondary | ICD-10-CM | POA: Diagnosis not present

## 2015-05-12 DIAGNOSIS — J45909 Unspecified asthma, uncomplicated: Secondary | ICD-10-CM | POA: Diagnosis not present

## 2015-05-12 LAB — I-STAT CHEM 8, ED
BUN: 9 mg/dL (ref 6–20)
Calcium, Ion: 1.16 mmol/L (ref 1.13–1.30)
Chloride: 101 mmol/L (ref 101–111)
Creatinine, Ser: 0.8 mg/dL (ref 0.44–1.00)
Glucose, Bld: 106 mg/dL — ABNORMAL HIGH (ref 65–99)
HEMATOCRIT: 44 % (ref 36.0–46.0)
HEMOGLOBIN: 15 g/dL (ref 12.0–15.0)
Potassium: 3.9 mmol/L (ref 3.5–5.1)
Sodium: 141 mmol/L (ref 135–145)
TCO2: 26 mmol/L (ref 0–100)

## 2015-05-12 MED ORDER — LORAZEPAM 0.5 MG PO TABS
0.5000 mg | ORAL_TABLET | Freq: Once | ORAL | Status: AC
  Administered 2015-05-12: 0.5 mg via ORAL
  Filled 2015-05-12: qty 1

## 2015-05-12 MED ORDER — ACETAMINOPHEN 325 MG PO TABS
650.0000 mg | ORAL_TABLET | Freq: Once | ORAL | Status: AC
  Administered 2015-05-12: 650 mg via ORAL
  Filled 2015-05-12: qty 2

## 2015-05-12 NOTE — ED Notes (Signed)
AVS explained in detail. Knows to follow up with PCP as soon as possible and keep BP monitored. Neurologically intact. Moving all extremities. A&Ox4. No other c/c.

## 2015-05-12 NOTE — ED Notes (Signed)
Bed: RESB Expected date:  Expected time:  Means of arrival:  Comments: EMS 69 yo female headache and hypertension-systolic 195

## 2015-05-12 NOTE — Discharge Instructions (Signed)

## 2015-05-12 NOTE — ED Provider Notes (Signed)
CSN: [id number]     Arrival date & time 05/12/15  1934 History   First MD Initiated Contact with Patient 05/12/15 2045     Chief Complaint  Patient presents with  . Hypertension     (Consider location/radiation/quality/duration/timing/severity/associated sxs/prior Treatment) HPI Comments: Patient with a history of diabetes mellitus hypertension and hyperlipidemia presents with elevated blood pressure. She states that she hasn't been sleeping well over the last couple nights. She's not sure why that she feels like she just has a lot on her mind. She states that she started having a throbbing type headache this afternoon in felt like her blood pressure was going up. She called EMS and EMS noted her blood pressure to be 230/110. She has a throbbing bilateral tip headache. She's had similar type headaches in the past when her blood pressures been elevated. She states it's a little bit better now than it was before. She denies any numbness or weakness in her extremities. She denies any speech deficits. There is no dizziness. No gait disturbances. She denies any fevers. No nausea or vomiting. No recent head injuries. She states she's been taking her blood pressure medicines as directed. She denies any chest pain or shortness of breath.  Patient is a 69 y.o. female presenting with hypertension.  Hypertension Associated symptoms include headaches. Pertinent negatives include no chest pain, no abdominal pain and no shortness of breath.    Past Medical History  Diagnosis Date  . Diabetes mellitus   . Hyperlipidemia   . Hypertension   . Allergy   . Asthma   . Arthritis    Past Surgical History  Procedure Laterality Date  . Appendectomy    . Pituitary surgery    . Tubal ligation    . Abdominal hysterectomy     Family History  Problem Relation Age of Onset  . Stomach cancer Mother   . Cancer Mother     stomach cancer  . Hypertension Father   . Heart disease Brother    History  Substance  Use Topics  . Smoking status: Former Smoker    Quit date: 02/02/1969  . Smokeless tobacco: Never Used  . Alcohol Use: No   OB History    No data available     Review of Systems  Constitutional: Negative for fever, chills, diaphoresis and fatigue.  HENT: Negative for congestion, rhinorrhea and sneezing.   Eyes: Negative.   Respiratory: Negative for cough, chest tightness and shortness of breath.   Cardiovascular: Negative for chest pain and leg swelling.  Gastrointestinal: Negative for nausea, vomiting, abdominal pain, diarrhea and blood in stool.  Genitourinary: Negative for frequency, hematuria, flank pain and difficulty urinating.  Musculoskeletal: Negative for back pain and arthralgias.  Skin: Negative for rash.  Neurological: Positive for headaches. Negative for dizziness, speech difficulty, weakness and numbness.      Allergies  Protonix  Home Medications   Prior to Admission medications   Medication Sig Start Date End Date Taking? Authorizing Provider  ammonium lactate (LAC-HYDRIN) 12 % lotion Apply 1 application topically as needed for dry skin. 12/16/14  Yes Barton Fanny, MD  atorvastatin (LIPITOR) 20 MG tablet Take 1 tablet (20 mg total) by mouth daily. 12/16/14  Yes Barton Fanny, MD  brimonidine (ALPHAGAN P) 0.1 % SOLN Place 1 drop into both eyes 2 (two) times daily.    Yes Historical Provider, MD  brinzolamide (AZOPT) 1 % ophthalmic suspension Place 1 drop into both eyes 2 (two) times daily.  Yes Historical Provider, MD  levothyroxine (SYNTHROID, LEVOTHROID) 75 MCG tablet Take 1 tablet (75 mcg total) by mouth daily. 03/24/15  Yes Barton Fanny, MD  Linaclotide [facility]) 145 MCG CAPS capsule Take 1 capsule (145 mcg total) by mouth daily. 12/16/14  Yes Barton Fanny, MD  metFORMIN (GLUCOPHAGE-XR) 500 MG 24 hr tablet Take 500 mg by mouth daily with breakfast.   Yes Historical Provider, MD  metoCLOPramide (REGLAN) 10 MG tablet Take 1 tablet (10  mg total) by mouth daily. 07/24/14  Yes Milus Banister, MD  montelukast (SINGULAIR) 10 MG tablet TAKE ONE TABLET BY MOUTH ONCE DAILY AT BEDTIME 03/25/14  Yes Barton Fanny, MD  ramipril (ALTACE) 5 MG capsule Take 1 capsule (5 mg total) by mouth daily. 12/16/14  Yes Barton Fanny, MD  triamcinolone (KENALOG) 0.025 % cream Apply 1 application topically 2 (two) times daily as needed (for skin). 12/16/14  Yes Barton Fanny, MD  albuterol (PROVENTIL HFA;VENTOLIN HFA) 108 (90 BASE) MCG/ACT inhaler Inhale 2 puffs into the lungs every 6 (six) hours as needed for wheezing (cough, shortness of breath or wheezing.). Patient not taking: Reported on 05/12/2015 12/16/14   Barton Fanny, MD  Blood Glucose Monitoring Suppl (BLOOD GLUCOSE METER) kit Use as instructed Patient not taking: Reported on 05/12/2015 11/06/12   Collene Leyden, PA-C  carboxymethylcellulose (REFRESH PLUS) 0.5 % SOLN Place 1 drop into both eyes 4 (four) times daily. Patient not taking: Reported on 05/12/2015 03/25/14   Barton Fanny, MD  gabapentin (NEURONTIN) 600 MG tablet TAKE ONE TABLET BY MOUTH 4 TIMES DAILY Patient not taking: Reported on 05/12/2015 10/21/14   Barton Fanny, MD  glucose blood (ONE TOUCH ULTRA TEST) test strip Use to test blood sugar daily. Dx code: E11.9 Patient not taking: Reported on 05/12/2015 02/11/15   Barton Fanny, MD  Lancets [facility] ULTRASOFT) lancets Test blood sugar daily. Dx code: E11.9 Patient not taking: Reported on 05/12/2015 02/11/15   Barton Fanny, MD  omeprazole (PRILOSEC) 20 MG capsule TAKE ONE CAPSULE BY MOUTH ONCE DAILY Patient not taking: Reported on 05/12/2015    Barton Fanny, MD  Probiotic Product (VSL#3) CAPS Take 1 capsule by mouth 2 (two) times daily. Patient not taking: Reported on 05/12/2015 12/16/14   Barton Fanny, MD   BP 184/82 mmHg  Pulse 71  Temp(Src) 98.1 F (36.7 C) (Oral)  Resp 15  SpO2 94% Physical Exam  Constitutional: She  is oriented to person, place, and time. She appears well-developed and well-nourished.  HENT:  Head: Normocephalic and atraumatic.  Eyes: Pupils are equal, round, and reactive to light.  Neck: Normal range of motion. Neck supple.  Cardiovascular: Normal rate, regular rhythm and normal heart sounds.   Pulmonary/Chest: Effort normal and breath sounds normal. No respiratory distress. She has no wheezes. She has no rales. She exhibits no tenderness.  Abdominal: Soft. Bowel sounds are normal. There is no tenderness. There is no rebound and no guarding.  Musculoskeletal: Normal range of motion. She exhibits no edema.  Lymphadenopathy:    She has no cervical adenopathy.  Neurological: She is alert and oriented to person, place, and time.  Motor 4 out of 5 all extremities. Sensation grossly intact to light touch all extremities. Finger-nose intact, no pronator drift. Cranial nerves II through XII grossly intact  Skin: Skin is warm and dry. No rash noted.  Psychiatric: She has a normal mood and affect.    ED Course  Procedures (  including critical care time) Labs Review Labs Reviewed  I-STAT CHEM 8, ED - Abnormal; Notable for the following:    Glucose, Bld 106 (*)    All other components within normal limits    Imaging Review No results found.   EKG Interpretation   Date/Time:  Wednesday May 12 2015 20:48:06 EDT Ventricular Rate:  74 PR Interval:  145 QRS Duration: 73 QT Interval:  407 QTC Calculation: 451 R Axis:   32 Text Interpretation:  Sinus rhythm Abnormal R-wave progression, early  transition since last tracing no significant change Confirmed by Lorynn Moeser   MD, Quinisha Mould ([id number]) on 05/12/2015 9:20:14 PM      MDM   Final diagnoses:  Essential hypertension    Patient's blood pressure has improved to 184/82. She's feeling better. Her headache has resolved. She has no neurologic deficits or evidence of a hypertensive emergency. Her labs are unremarkable.  She was discharged home  in good condition advised to follow-up with her primary care physician for recheck on her blood pressure.    Malvin Johns, MD 05/12/15 2245

## 2015-05-12 NOTE — ED Notes (Addendum)
Per EMS- auscultated BP 230/110 in right arm. C/o headache starting yesterday-intermittent. Takes HTN medications as prescribed. Hasn't been to MD since January. No visual disturbances. Neurologically intact. VS: HR 88. A&Ox4. No other c/c.

## 2015-05-14 ENCOUNTER — Ambulatory Visit (INDEPENDENT_AMBULATORY_CARE_PROVIDER_SITE_OTHER): Payer: Medicare Other | Admitting: Physician Assistant

## 2015-05-14 VITALS — BP 134/62 | HR 75 | Temp 98.9°F | Resp 20 | Ht 60.5 in | Wt 148.0 lb

## 2015-05-14 DIAGNOSIS — Z8669 Personal history of other diseases of the nervous system and sense organs: Secondary | ICD-10-CM

## 2015-05-14 DIAGNOSIS — Z09 Encounter for follow-up examination after completed treatment for conditions other than malignant neoplasm: Secondary | ICD-10-CM | POA: Diagnosis not present

## 2015-05-14 DIAGNOSIS — Z8679 Personal history of other diseases of the circulatory system: Secondary | ICD-10-CM

## 2015-05-14 MED ORDER — BLOOD PRESSURE KIT: PACK | Status: DC

## 2015-05-14 NOTE — Progress Notes (Signed)
05/14/2015 at 8:32 PM  Mandy Webb / DOB: Sep 16, 1946 / MRN: [id number]  The patient has HEPATITIS C; PITUITARY ADENOMA; NEOPLASM UNCERTAIN BEHAVIOR STOMACH INTEST&RECT; Hypothyroidism; DIABETES MELLITUS, TYPE II; RESTLESS LEG SYNDROME; PERIPHERAL NEUROPATHY; Essential hypertension; ASTHMA; GERD; DYSPEPSIA; CONSTIPATION; NONSPECIFIC ABN FINDING RAD & OTH EXAM GI TRACT; COLONIC POLYPS, HX OF; Lumbar degenerative disc disease; and Carotid artery disease without cerebral infarction on her problem list.  SUBJECTIVE  Chief complaint: Hypertension  Mandy Webb is a very pleasant latino [place] female here today for an emergency room follow up.  She was seen at [facility] ED on 5/25 after she called 911 for severe HA.  EMS recorded her BP at 230/110.  At the ED her neurological exam was normal, and her blood pressure reduced nicely during her brief stay.  She was advised to follow up with her PCP.  Her labs were largely normal at the ED and no imaging was done. She did have a negative head CT on 03/22/14 and no acute changes were noted at that time.   Today the patient is feeling well and is not complaining of HA.  She reports that she has not missed any doses of her medication.  Her pressure is managed with Altace 5 mg qd.  She is taking an alpha adrenergic eye drop to manage her glaucoma.  She sees Dr. Tracie Harrier for her opthalmic care.   She  has a past medical history of Diabetes mellitus; Hyperlipidemia; Hypertension; Allergy; Asthma; and Arthritis.    Medications reviewed and updated by myself where necessary, and exist elsewhere in the encounter.  Mandy Webb is allergic to protonix. She  reports that she quit smoking about 46 years ago. She has never used smokeless tobacco. She reports that she does not drink alcohol or use illicit drugs. She  reports that she does not engage in sexual activity. The patient  has past surgical history that includes Appendectomy; Pituitary surgery; Tubal  ligation; and Abdominal hysterectomy.  Her family history includes Cancer in her mother; Heart disease in her brother; Hypertension in her father; Stomach cancer in her mother.  Review of Systems  Constitutional: Negative for fever and chills.  Eyes: Negative for blurred vision.  Respiratory: Negative for cough.   Cardiovascular: Negative for chest pain.  Gastrointestinal: Negative for nausea.  Genitourinary: Negative for dysuria.  Neurological: Negative for dizziness and headaches.    OBJECTIVE  Her  height is 5' 0.5" (1.537 m) and weight is 148 lb (67.132 kg). Her oral temperature is 98.9 F (37.2 C). Her blood pressure is 134/62 and her pulse is 75. Her respiration is 20 and oxygen saturation is 96%.  The patient's body mass index is 28.42 kg/(m^2).  Physical Exam  Vitals reviewed. Constitutional: She is oriented to person, place, and time.  Cardiovascular: Normal rate, regular rhythm and normal heart sounds.   Respiratory: Effort normal.  GI: Soft.  Musculoskeletal: Normal range of motion.  Neurological: She is alert and oriented to person, place, and time. No cranial nerve deficit. Coordination and gait normal.    No results found for this or any previous visit (from the past 24 hour(s)).  ASSESSMENT & PLAN  Mandy Webb was seen today for hypertension.  Diagnoses and all orders for this visit:  Hospital discharge follow-up: Medications extensively review with patient.  No interactions found that would cause an isolated incident of hypertension.  I consulted with Dr. Tracie Harrier via phone regarding Alphagan as a possible cause of this isolated hypertensive episode given  its alpha adrenergic properties.  He did feel that this may have been a cause of this most recent episode of emergent hypertension, as the medication may have been inadvertently absorbed systemically through the punctum.  He advised that this medication be stopped and would like to see Mandy Webb in clinic to adjust  her glaucoma treatment plan.  Patient in agreement with stopping Alphagan and with opthalmology follow up.  Appreciate the assistance of our specialist.    History of hypertension Orders: -     Blood Pressure KIT; Please check you blood pressure after resting for five minutes.  Call [facility] if you pressure is consistently higher than 150/90.  History of glaucoma: Stopping Alphagan per specialist recs.  Patient to follow up in with optho.      The patient was advised to call or come back to clinic if she does not see an improvement in symptoms, or worsens with the above plan.   Philis Fendt, MHS, PA-C [facility] 05/14/2015 8:32 PM

## 2015-05-14 NOTE — Patient Instructions (Addendum)
Stop taking Alphagan eyedrops. Please return to see Dr. Venetia Maxon for an adjustment to your glaucoma care.

## 2015-05-18 ENCOUNTER — Other Ambulatory Visit: Payer: Self-pay | Admitting: Family Medicine

## 2015-05-31 ENCOUNTER — Encounter: Payer: Self-pay | Admitting: Family Medicine

## 2015-05-31 ENCOUNTER — Ambulatory Visit (INDEPENDENT_AMBULATORY_CARE_PROVIDER_SITE_OTHER): Payer: Medicare Other | Admitting: Family Medicine

## 2015-05-31 VITALS — BP 112/68 | HR 74 | Temp 98.4°F | Resp 16 | Ht 61.0 in | Wt 149.4 lb

## 2015-05-31 DIAGNOSIS — E038 Other specified hypothyroidism: Secondary | ICD-10-CM

## 2015-05-31 DIAGNOSIS — K5900 Constipation, unspecified: Secondary | ICD-10-CM | POA: Diagnosis not present

## 2015-05-31 DIAGNOSIS — I10 Essential (primary) hypertension: Secondary | ICD-10-CM

## 2015-05-31 DIAGNOSIS — J029 Acute pharyngitis, unspecified: Secondary | ICD-10-CM

## 2015-05-31 DIAGNOSIS — J4520 Mild intermittent asthma, uncomplicated: Secondary | ICD-10-CM | POA: Diagnosis not present

## 2015-05-31 DIAGNOSIS — E034 Atrophy of thyroid (acquired): Secondary | ICD-10-CM

## 2015-05-31 MED ORDER — RAMIPRIL 5 MG PO CAPS: 5.0000 mg | ORAL_CAPSULE | Freq: Every day | ORAL | Status: DC

## 2015-05-31 MED ORDER — MONTELUKAST SODIUM 10 MG PO TABS: 10.0000 mg | ORAL_TABLET | Freq: Every day | ORAL | Status: DC

## 2015-05-31 MED ORDER — LEVOTHYROXINE SODIUM 75 MCG PO TABS: 75.0000 ug | ORAL_TABLET | Freq: Every day | ORAL | Status: DC

## 2015-05-31 MED ORDER — LINACLOTIDE 145 MCG PO CAPS: 145.0000 ug | ORAL_CAPSULE | Freq: Every day | ORAL | Status: DC

## 2015-05-31 NOTE — Progress Notes (Signed)
Subjective:    Patient ID: Mandy Webb, female    DOB: 12/20/1945, 69 y.o.   MRN: [id number]  HPI This is a very pleasant 69 yo female who presents today with a sore throat for several days. She is concerned because she is going out of town to [place] soon to visit her family and she does not want to be ill. Her throat feels dry and the discomfort is relieved with warm liquids and gargling with Listerine mouth wash. She has not had any difficulty swallowing foods or liquids. She denies chest pain, reflux, nausea/vomiting. Sleeping well. No sick contacts.   She also requests several medication refills today. She was seen in ER 05/12/15 with elevated BP and headache. No recurrence of headache. Tolerating ramipril 5 mg well.   She requests refill of eye drops- this will be deferred to her opthomalogist. She has an appointment tomorrow.   Past Medical History  Diagnosis Date  . Diabetes mellitus   . Hyperlipidemia   . Hypertension   . Allergy   . Asthma   . Arthritis    Past Surgical History  Procedure Laterality Date  . Appendectomy    . Pituitary surgery    . Tubal ligation    . Abdominal hysterectomy     Family History  Problem Relation Age of Onset  . Stomach cancer Mother   . Cancer Mother     stomach cancer  . Hypertension Father   . Heart disease Brother    History  Substance Use Topics  . Smoking status: Former Smoker    Quit date: 02/02/1969  . Smokeless tobacco: Never Used  . Alcohol Use: No   Medications, allergies, past medical history, surgical history, family history, social history and problem list reviewed and updated.   Review of Systems No cough, no SOB, breathing stable, no headache, no ear pain, no nasal drainage, no chest pain, no swelling of feet or ankles. No fever or chills.     Objective:   Physical Exam Physical Exam  Constitutional: Oriented to person, place, and time. He appears well-developed and well-nourished.  HENT:  Head:  Normocephalic and atraumatic.  Eyes: Conjunctivae are normal.  Ears: Normal canals, normal TMs Nose: small amount rhinorrhea Throat: No erythema, no edema, no exudate Neck: Normal range of motion. Neck supple. No adenopathy.  Cardiovascular: Normal rate, regular rhythm and normal heart sounds.   Pulmonary/Chest: Effort normal and breath sounds normal.  Musculoskeletal: Normal range of motion.  Neurological: Alert and oriented to person, place, and time.  Skin: Skin is warm and dry.  Psychiatric: Normal mood and affect. Behavior is normal. Judgment and thought content normal.  Vitals reviewed.  BP 112/68 mmHg  Pulse 74  Temp(Src) 98.4 F (36.9 C) (Oral)  Resp 16  Ht 5\' 1"  (1.549 m)  Wt 149 lb 6.4 oz (67.767 kg)  BMI 28.24 kg/m2  SpO2 94%     Assessment & Plan:  1. Sore throat - suspect viral illness or post nasal drainage as cause of discomfort.  - provided instructions for comfort measures - RTC if no improvement in 5 days, sooner if she develops fever/cough/SOB  2. Hypothyroidism due to acquired atrophy of thyroid - levothyroxine (SYNTHROID, LEVOTHROID) 75 MCG tablet; Take 1 tablet (75 mcg total) by mouth daily.  Dispense: 90 tablet; Refill: 1 - will need TSH checked at next appointment  3. Essential hypertension - normal blood pressure readings at last two appointments - ramipril (ALTACE) 5 MG capsule; Take 1  capsule (5 mg total) by mouth daily.  Dispense: 90 capsule; Refill: 1  4. Constipation, unspecified constipation type - Linaclotide (LINZESS) 145 MCG CAPS capsule; Take 1 capsule (145 mcg total) by mouth daily.  Dispense: 30 capsule; Refill: 5  5. Asthma, mild intermittent, uncomplicated - montelukast (SINGULAIR) 10 MG tablet; Take 1 tablet (10 mg total) by mouth at bedtime.  Dispense: 90 tablet; Refill: [place], FNP-BC  [facility]  06/03/2015 9:07 PM

## 2015-05-31 NOTE — Patient Instructions (Signed)
Keep throat moist with liquids- warm liquids may make it feel better.  Gargle with listerine, warm salt water and can take tylenol for throat pain.

## 2015-06-01 DIAGNOSIS — H35039 Hypertensive retinopathy, unspecified eye: Secondary | ICD-10-CM | POA: Diagnosis not present

## 2015-06-01 DIAGNOSIS — H2513 Age-related nuclear cataract, bilateral: Secondary | ICD-10-CM | POA: Diagnosis not present

## 2015-06-01 DIAGNOSIS — H04123 Dry eye syndrome of bilateral lacrimal glands: Secondary | ICD-10-CM | POA: Diagnosis not present

## 2015-06-01 DIAGNOSIS — H4011X3 Primary open-angle glaucoma, severe stage: Secondary | ICD-10-CM | POA: Diagnosis not present

## 2015-06-28 ENCOUNTER — Other Ambulatory Visit: Payer: Self-pay | Admitting: Family Medicine

## 2015-07-24 ENCOUNTER — Other Ambulatory Visit: Payer: Self-pay | Admitting: Gastroenterology

## 2015-07-29 ENCOUNTER — Ambulatory Visit (INDEPENDENT_AMBULATORY_CARE_PROVIDER_SITE_OTHER): Payer: Medicare Other | Admitting: Podiatry

## 2015-07-29 ENCOUNTER — Encounter: Payer: Self-pay | Admitting: Podiatry

## 2015-07-29 DIAGNOSIS — B351 Tinea unguium: Secondary | ICD-10-CM | POA: Diagnosis not present

## 2015-07-29 DIAGNOSIS — M79673 Pain in unspecified foot: Secondary | ICD-10-CM | POA: Diagnosis not present

## 2015-07-29 DIAGNOSIS — E1140 Type 2 diabetes mellitus with diabetic neuropathy, unspecified: Secondary | ICD-10-CM | POA: Diagnosis not present

## 2015-07-29 NOTE — Progress Notes (Signed)
Patient ID: Mandy Webb, female   DOB: [birth year], 69 y.o.   MRN: [id number] Complaint:  Visit Type: Patient returns to my office for continued preventative foot care services. Complaint: Patient states" my nails have grown long and thick and become painful to walk and wear shoes" .Patient is diabetic with neuropathy. The patient presents for preventative foot care services. No changes to ROS  Podiatric Exam: Vascular: dorsalis pedis and posterior tibial pulses are palpable bilateral. Capillary return is immediate. Temperature gradient is WNL. Skin turgor WNL  Sensorium: Normal Semmes Weinstein monofilament test. Normal tactile sensation bilaterally. Nail Exam: Pt has thick disfigured discolored nails with subungual debris noted bilateral entire nail hallux through fifth toenails Ulcer Exam: There is no evidence of ulcer or pre-ulcerative changes or infection. Orthopedic Exam: Muscle tone and strength are WNL. No limitations in general ROM. No crepitus or effusions noted. Foot type and digits show no abnormalities. Bony prominences are unremarkable. Skin: No Porokeratosis. No infection or ulcers  Diagnosis:  Onychomycosis, , Pain in right toe, pain in left toes  Treatment & Plan Procedures and Treatment: Consent by patient was obtained for treatment procedures. The patient understood the discussion of treatment and procedures well. All questions were answered thoroughly reviewed. Debridement of mycotic and hypertrophic toenails, 1 through 5 bilateral and clearing of subungual debris. No ulceration, no infection noted.  Return Visit-Office Procedure: Patient instructed to return to the office for a follow up visit 3 months for continued evaluation and treatment.

## 2015-07-31 DIAGNOSIS — J45909 Unspecified asthma, uncomplicated: Secondary | ICD-10-CM | POA: Diagnosis not present

## 2015-07-31 DIAGNOSIS — Z79899 Other long term (current) drug therapy: Secondary | ICD-10-CM | POA: Insufficient documentation

## 2015-07-31 DIAGNOSIS — E785 Hyperlipidemia, unspecified: Secondary | ICD-10-CM | POA: Insufficient documentation

## 2015-07-31 DIAGNOSIS — I10 Essential (primary) hypertension: Secondary | ICD-10-CM | POA: Diagnosis not present

## 2015-07-31 DIAGNOSIS — E119 Type 2 diabetes mellitus without complications: Secondary | ICD-10-CM | POA: Diagnosis not present

## 2015-07-31 DIAGNOSIS — Z87891 Personal history of nicotine dependence: Secondary | ICD-10-CM | POA: Insufficient documentation

## 2015-07-31 DIAGNOSIS — M1990 Unspecified osteoarthritis, unspecified site: Secondary | ICD-10-CM | POA: Insufficient documentation

## 2015-07-31 DIAGNOSIS — R51 Headache: Secondary | ICD-10-CM | POA: Diagnosis present

## 2015-07-31 DIAGNOSIS — I159 Secondary hypertension, unspecified: Secondary | ICD-10-CM | POA: Insufficient documentation

## 2015-07-31 NOTE — ED Notes (Signed)
Patient here with hypertension via EMS. Was experiencing dizziness and headache. Called EMS with concern for HTN. 180/118 reported by [facility]. No other complaints at this time.

## 2015-08-01 ENCOUNTER — Encounter (HOSPITAL_COMMUNITY): Payer: Self-pay | Admitting: Emergency Medicine

## 2015-08-01 ENCOUNTER — Emergency Department (HOSPITAL_COMMUNITY)
Admission: EM | Admit: 2015-08-01 | Discharge: 2015-08-01 | Disposition: A | Discharge status: Home / Self Care | Payer: Medicare Other | Attending: Emergency Medicine | Admitting: Emergency Medicine

## 2015-08-01 DIAGNOSIS — I159 Secondary hypertension, unspecified: Secondary | ICD-10-CM

## 2015-08-01 DIAGNOSIS — R51 Headache: Secondary | ICD-10-CM

## 2015-08-01 DIAGNOSIS — R519 Headache, unspecified: Secondary | ICD-10-CM

## 2015-08-01 LAB — CBC
HEMATOCRIT: 41.7 % (ref 36.0–46.0)
Hemoglobin: 14.2 g/dL (ref 12.0–15.0)
MCH: 26.7 pg (ref 26.0–34.0)
MCHC: 34.1 g/dL (ref 30.0–36.0)
MCV: 78.4 fL (ref 78.0–100.0)
Platelets: 179 10*3/uL (ref 150–400)
RBC: 5.32 MIL/uL — ABNORMAL HIGH (ref 3.87–5.11)
RDW: 13 % (ref 11.5–15.5)
WBC: 5 10*3/uL (ref 4.0–10.5)

## 2015-08-01 LAB — URINALYSIS, ROUTINE W REFLEX MICROSCOPIC
Bilirubin Urine: NEGATIVE
Glucose, UA: NEGATIVE mg/dL
Hgb urine dipstick: NEGATIVE
Ketones, ur: NEGATIVE mg/dL
NITRITE: NEGATIVE
Protein, ur: NEGATIVE mg/dL
SPECIFIC GRAVITY, URINE: 1.014 (ref 1.005–1.030)
Urobilinogen, UA: 4 mg/dL — ABNORMAL HIGH (ref 0.0–1.0)
pH: 7.5 (ref 5.0–8.0)

## 2015-08-01 LAB — BASIC METABOLIC PANEL
Anion gap: 12 (ref 5–15)
BUN: 11 mg/dL (ref 6–20)
CO2: 29 mmol/L (ref 22–32)
Calcium: 9.4 mg/dL (ref 8.9–10.3)
Chloride: 99 mmol/L — ABNORMAL LOW (ref 101–111)
Creatinine, Ser: 0.92 mg/dL (ref 0.44–1.00)
GFR calc Af Amer: >60 mL/min (ref >60)
GFR calc non Af Amer: >60 mL/min (ref >60)
Glucose, Bld: 135 mg/dL — ABNORMAL HIGH (ref 65–99)
POTASSIUM: 3.6 mmol/L (ref 3.5–5.1)
SODIUM: 140 mmol/L (ref 135–145)

## 2015-08-01 LAB — URINE MICROSCOPIC-ADD ON

## 2015-08-01 MED ORDER — NAPROXEN 250 MG PO TABS
500.0000 mg | ORAL_TABLET | Freq: Once | ORAL | Status: AC
  Administered 2015-08-01: 500 mg via ORAL
  Filled 2015-08-01: qty 2

## 2015-08-01 NOTE — Discharge Instructions (Signed)
General Headache Without Cause °A headache is pain or discomfort felt around the head or neck area. The specific cause of a headache may not be found. There are many causes and types of headaches. A few common ones are: °· Tension headaches. °· Migraine headaches. °· Cluster headaches. °· Chronic daily headaches. °HOME CARE INSTRUCTIONS  °· Keep all follow-up appointments with your caregiver or any specialist referral. °· Only take over-the-counter or prescription medicines for pain or discomfort as directed by your caregiver. °· Lie down in a dark, quiet room when you have a headache. °· Keep a headache journal to find out what may trigger your migraine headaches. For example, write down: °· What you eat and drink. °· How much sleep you get. °· Any change to your diet or medicines. °· Try massage or other relaxation techniques. °· Put ice packs or heat on the head and neck. Use these 3 to 4 times per day for 15 to 20 minutes each time, or as needed. °· Limit stress. °· Sit up straight, and do not tense your muscles. °· Quit smoking if you smoke. °· Limit alcohol use. °· Decrease the amount of caffeine you drink, or stop drinking caffeine. °· Eat and sleep on a regular schedule. °· Get 7 to 9 hours of sleep, or as recommended by your caregiver. °· Keep lights dim if bright lights bother you and make your headaches worse. °SEEK MEDICAL CARE IF:  °· You have problems with the medicines you were prescribed. °· Your medicines are not working. °· You have a change from the usual headache. °· You have nausea or vomiting. °SEEK IMMEDIATE MEDICAL CARE IF:  °· Your headache becomes severe. °· You have a fever. °· You have a stiff neck. °· You have loss of vision. °· You have muscular weakness or loss of muscle control. °· You start losing your balance or have trouble walking. °· You feel faint or pass out. °· You have severe symptoms that are different from your first symptoms. °MAKE SURE YOU:  °· Understand these  instructions. °· Will watch your condition. °· Will get help right away if you are not doing well or get worse. °Document Released: 12/04/2005 Document Revised: 02/26/2012 Document Reviewed: 12/20/2011 °ExitCare® Patient Information ©2015 [employer]. This information is not intended to replace advice given to you by your health care provider. Make sure you discuss any questions you have with your health care provider. °Hypertension °Hypertension, commonly called high blood pressure, is when the force of blood pumping through your arteries is too strong. Your arteries are the blood vessels that carry blood from your heart throughout your body. A blood pressure reading consists of a higher number over a lower number, such as 110/72. The higher number (systolic) is the pressure inside your arteries when your heart pumps. The lower number (diastolic) is the pressure inside your arteries when your heart relaxes. Ideally you want your blood pressure below 120/80. °Hypertension forces your heart to work harder to pump blood. Your arteries may become narrow or stiff. Having hypertension puts you at risk for heart disease, stroke, and other problems.  °RISK FACTORS °Some risk factors for high blood pressure are controllable. Others are not.  °Risk factors you cannot control include:  °· Race. You may be at higher risk if you are African American. °· Age. Risk increases with age. °· Gender. Men are at higher risk than women before age 45 years. After age 65, women are at higher risk than   than men. Risk factors you can control include:  Not getting enough exercise or physical activity.  Being overweight.  Getting too much fat, sugar, calories, or salt in your diet.  Drinking too much alcohol. SIGNS AND SYMPTOMS Hypertension does not usually cause signs or symptoms. Extremely high blood pressure (hypertensive crisis) may cause headache, anxiety, shortness of breath, and nosebleed. DIAGNOSIS  To check if you have  hypertension, your health care provider will measure your blood pressure while you are seated, with your arm held at the level of your heart. It should be measured at least twice using the same arm. Certain conditions can cause a difference in blood pressure between your right and left arms. A blood pressure reading that is higher than normal on one occasion does not mean that you need treatment. If one blood pressure reading is high, ask your health care provider about having it checked again. TREATMENT  Treating high blood pressure includes making lifestyle changes and possibly taking medicine. Living a healthy lifestyle can help lower high blood pressure. You may need to change some of your habits. Lifestyle changes may include:  Following the DASH diet. This diet is high in fruits, vegetables, and whole grains. It is low in salt, red meat, and added sugars.  Getting at least 2 hours of brisk physical activity every week.  Losing weight if necessary.  Not smoking.  Limiting alcoholic beverages.  Learning ways to reduce stress. If lifestyle changes are not enough to get your blood pressure under control, your health care provider may prescribe medicine. You may need to take more than one. Work closely with your health care provider to understand the risks and benefits. HOME CARE INSTRUCTIONS  Have your blood pressure rechecked as directed by your health care provider.   Take medicines only as directed by your health care provider. Follow the directions carefully. Blood pressure medicines must be taken as prescribed. The medicine does not work as well when you skip doses. Skipping doses also puts you at risk for problems.   Do not smoke.   Monitor your blood pressure at home as directed by your health care provider. SEEK MEDICAL CARE IF:   You think you are having a reaction to medicines taken.  You have recurrent headaches or feel dizzy.  You have swelling in your  ankles.  You have trouble with your vision. SEEK IMMEDIATE MEDICAL CARE IF:  You develop a severe headache or confusion.  You have unusual weakness, numbness, or feel faint.  You have severe chest or abdominal pain.  You vomit repeatedly.  You have trouble breathing. MAKE SURE YOU:   Understand these instructions.  Will watch your condition.  Will get help right away if you are not doing well or get worse. Document Released: 12/04/2005 Document Revised: 04/20/2014 Document Reviewed: 09/26/2013 [facility] Patient Information 2015 [place], [place]. This information is not intended to replace advice given to you by your health care provider. Make sure you discuss any questions you have with your health care provider.  How to Take Your Blood Pressure HOW DO I GET A BLOOD PRESSURE MACHINE?  You can buy an electronic home blood pressure machine at your local pharmacy. Insurance will sometimes cover the cost if you have a prescription.  Ask your doctor what type of machine is best for you. There are different machines for your arm and your wrist.  If you decide to buy a machine to check your blood pressure on your arm, first check the  size of your arm so you can buy the right size cuff. To check the size of your arm:   Use a measuring tape that shows both inches and centimeters.   Wrap the measuring tape around the upper-middle part of your arm. You may need someone to help you measure.   Write down your arm measurement in both inches and centimeters.   To measure your blood pressure correctly, it is important to have the right size cuff.   If your arm is up to 13 inches (up to 34 centimeters), get an adult cuff size.  If your arm is 13 to 17 inches (35 to 44 centimeters), get a large adult cuff size.    If your arm is 17 to 20 inches (45 to 52 centimeters), get an adult thigh cuff.  WHAT DO THE NUMBERS MEAN?   There are two numbers that make up your blood pressure. For  example: 120/80.  The first number (120 in our example) is called the "systolic pressure." It is a measure of the pressure in your blood vessels when your heart is pumping blood.  The second number (80 in our example) is called the "diastolic pressure." It is a measure of the pressure in your blood vessels when your heart is resting between beats.  Your doctor will tell you what your blood pressure should be. WHAT SHOULD I DO BEFORE I CHECK MY BLOOD PRESSURE?   Try to rest or relax for at least 30 minutes before you check your blood pressure.  Do not smoke.  Do not have any drinks with caffeine, such as:  Soda.  Coffee.  Tea.  Check your blood pressure in a quiet room.  Sit down and stretch out your arm on a table. Keep your arm at about the level of your heart. Let your arm relax.  Make sure that your legs are not crossed. HOW DO I CHECK MY BLOOD PRESSURE?  Follow the directions that came with your machine.  Make sure you remove any tight-fighting clothing from your arm or wrist. Wrap the cuff around your upper arm or wrist. You should be able to fit a finger between the cuff and your arm. If you cannot fit a finger between the cuff and your arm, it is too tight and should be removed and rewrapped.  Some units require you to manually pump up the arm cuff.  Automatic units inflate the cuff when you press a button.  Cuff deflation is automatic in both models.  After the cuff is inflated, the unit measures your blood pressure and pulse. The readings are shown on a monitor. Hold still and breathe normally while the cuff is inflated.  Getting a reading takes less than a minute.  Some models store readings in a memory. Some provide a printout of readings. If your machine does not store your readings, keep a written record.  Take readings with you to your next visit with your doctor. Document Released: 11/16/2008 Document Revised: 04/20/2014 Document Reviewed:  01/29/2014 [facility] Patient Information 2015 [place], [place]. This information is not intended to replace advice given to you by your health care provider. Make sure you discuss any questions you have with your health care provider.

## 2015-08-01 NOTE — ED Provider Notes (Signed)
TIME SEEN: 4:30 AM   CHIEF COMPLAINT:  Headache, hypertension  HPI: Pt is a 69 y.o.  Female with history of hypertension, hyperlipidemia, diabetes who presents to the emergency department with complaints of a diffuse throbbing moderate  Gradual onset headache that started yesterday. States that she felt like her heart was beating in her head. She has had similar headaches in the past that have resolved with Aleve but states she did not take Aleve last night because she was concerned that it may interact with the melatonin that she took to help her sleep. She states when she called EMS her blood pressure was 180/118. She is on Ramipril  And denies missing this medication. Not on anticoagulation. No head injury. Denies vision changes. No numbness, tingling or focal weakness. No chest pain or shortness of breath. Denies severe, sudden onset, thunderclap headache.  ROS: See HPI Constitutional: no fever  Eyes: no drainage  ENT: no runny nose   Cardiovascular:  no chest pain  Resp: no SOB  GI: no vomiting GU: no dysuria Integumentary: no rash  Allergy: no hives  Musculoskeletal: no leg swelling  Neurological: no slurred speech ROS otherwise negative  PAST MEDICAL HISTORY/PAST SURGICAL HISTORY:  Past Medical History  Diagnosis Date  . Diabetes mellitus   . Hyperlipidemia   . Hypertension   . Allergy   . Asthma   . Arthritis     MEDICATIONS:  Prior to Admission medications   Medication Sig Start Date End Date Taking? Authorizing Provider  albuterol (PROVENTIL HFA;VENTOLIN HFA) 108 (90 BASE) MCG/ACT inhaler Inhale 2 puffs into the lungs every 6 (six) hours as needed for wheezing (cough, shortness of breath or wheezing.). 12/16/14   Barton Fanny, MD  ALPHAGAN P 0.1 % SOLN  05/05/15   Historical Provider, MD  atorvastatin (LIPITOR) 20 MG tablet TAKE ONE TABLET BY MOUTH DAILY 05/18/15   Tereasa Coop, PA-C  Blood Glucose Monitoring Suppl (BLOOD GLUCOSE METER) kit Use as instructed  11/06/12   Collene Leyden, PA-C  Blood Pressure KIT Please check you blood pressure after resting for five minutes.  Call [facility] if you pressure is consistently higher than 150/90. 05/14/15   Tereasa Coop, PA-C  brinzolamide (AZOPT) 1 % ophthalmic suspension Place 1 drop into both eyes 2 (two) times daily.     Historical Provider, MD  gabapentin (NEURONTIN) 600 MG tablet TAKE ONE TABLET BY MOUTH 4 TIMES DAILY 05/18/15   Tereasa Coop, PA-C  glucose blood (ONE TOUCH ULTRA TEST) test strip Use to test blood sugar daily. Dx code: E11.9 02/11/15   Barton Fanny, MD  Lancets [facility] ULTRASOFT) lancets Test blood sugar daily. Dx code: E11.9 02/11/15   Barton Fanny, MD  levothyroxine (SYNTHROID, LEVOTHROID) 75 MCG tablet Take 1 tablet (75 mcg total) by mouth daily. 05/31/15   Elby Beck, FNP  Linaclotide [facility]) 145 MCG CAPS capsule Take 1 capsule (145 mcg total) by mouth daily. 05/31/15   Elby Beck, FNP  metoCLOPramide (REGLAN) 10 MG tablet TAKE ONE TABLET BY MOUTH ONCE DAILY 07/26/15   Milus Banister, MD  montelukast (SINGULAIR) 10 MG tablet Take 1 tablet (10 mg total) by mouth at bedtime. 05/31/15   Elby Beck, FNP  omeprazole (PRILOSEC) 20 MG capsule TAKE ONE CAPSULE BY MOUTH ONCE DAILY    Barton Fanny, MD  Probiotic Product (VSL#3) CAPS Take 1 capsule by mouth 2 (two) times daily. 12/16/14   Barton Fanny, MD  ramipril (ALTACE) 5 MG capsule Take 1 capsule (5 mg total) by mouth daily. 05/31/15   Elby Beck, FNP  triamcinolone (KENALOG) 0.025 % cream Apply 1 application topically 2 (two) times daily as needed (for skin). 12/16/14   Barton Fanny, MD    ALLERGIES:  Allergies  Allergen Reactions  . Protonix [Pantoprazole Sodium]     unknown    SOCIAL HISTORY:  Social History  Substance Use Topics  . Smoking status: Former Smoker    Quit date: 02/02/1969  . Smokeless tobacco: Never Used  . Alcohol Use: No    FAMILY HISTORY: Family  History  Problem Relation Age of Onset  . Stomach cancer Mother   . Cancer Mother     stomach cancer  . Hypertension Father   . Heart disease Brother     EXAM: BP 134/97 mmHg  Pulse 70  Temp(Src) 98.2 F (36.8 C) (Oral)  Resp 22  SpO2 99% CONSTITUTIONAL: Alert and oriented and responds appropriately to questions. Well-appearing; well-nourished , elderly, in no distress HEAD: Normocephalic EYES: Conjunctivae clear, PERRL , extra ocular movements intact ENT: normal nose; no rhinorrhea; moist mucous membranes; pharynx without lesions noted NECK: Supple, no meningismus, no LAD  CARD: RRR; S1 and S2 appreciated; no murmurs, no clicks, no rubs, no gallops RESP: Normal chest excursion without splinting or tachypnea; breath sounds clear and equal bilaterally; no wheezes, no rhonchi, no rales, no hypoxia or respiratory distress, speaking full sentences ABD/GI: Normal bowel sounds; non-distended; soft, non-tender, no rebound, no guarding, no peritoneal signs BACK:  The back appears normal and is non-tender to palpation, there is no CVA tenderness EXT: Normal ROM in all joints; non-tender to palpation; no edema; normal capillary refill; no cyanosis, no calf tenderness or swelling    SKIN: Normal color for age and race; warm NEURO: Moves all extremities equally, sensation to light touch intact diffusely, cranial nerves II through XII intact PSYCH: The patient's mood and manner are appropriate. Grooming and personal hygiene are appropriate.  MEDICAL DECISION MAKING:  Patient here with hypertension. Hypertension has improved without intervention and her blood pressure is 134/97. Still complaining of mild headache which she has had previously. Neurologically intact. No thunderclap, severe or sudden onset headache. I do not feel she needs head imaging at this time. States Aleve normally helps with her headache. Will give dose of naproxen in the emergency department. Labs unremarkable. EKG and  urinalysis pending.  ED PROGRESS: EKG shows no significant abnormality.urine shows no hematuria or proteinuria. Blood pressure is now 160/80s in her headache has improved. I feel she is safe to be discharged home. Discussed usual and customary return precautions. She verbalized understanding and is comfortable plan.     EKG Interpretation  Date/Time:  Sunday August 01 2015 04:52:24 EDT Ventricular Rate:  71 PR Interval:  172 QRS Duration: 76 QT Interval:  378 QTC Calculation: 410 R Axis:   56 Text Interpretation:  Normal sinus rhythm Nonspecific ST abnormality Abnormal ECG No significant change since last tracing Confirmed by Alannah Averhart,  DO, Tabitha Riggins ([id number]) on 08/01/2015 5:00:38 AM     \   [place], DO 08/01/15 [id number]

## 2015-08-03 ENCOUNTER — Ambulatory Visit (INDEPENDENT_AMBULATORY_CARE_PROVIDER_SITE_OTHER): Payer: Medicare Other | Admitting: Family Medicine

## 2015-08-03 VITALS — BP 120/75 | HR 88 | Temp 98.8°F | Resp 18 | Wt 146.2 lb

## 2015-08-03 DIAGNOSIS — I10 Essential (primary) hypertension: Secondary | ICD-10-CM

## 2015-08-03 DIAGNOSIS — G43911 Migraine, unspecified, intractable, with status migrainosus: Secondary | ICD-10-CM | POA: Diagnosis not present

## 2015-08-03 DIAGNOSIS — E860 Dehydration: Secondary | ICD-10-CM | POA: Diagnosis not present

## 2015-08-03 LAB — GLUCOSE, POCT (MANUAL RESULT ENTRY): POC Glucose: 108 mg/dL — AB (ref 70–99)

## 2015-08-03 MED ORDER — KETOROLAC TROMETHAMINE 30 MG/ML IJ SOLN
30.0000 mg | Freq: Once | INTRAMUSCULAR | Status: AC
  Administered 2015-08-03: 30 mg via INTRAMUSCULAR

## 2015-08-03 NOTE — Patient Instructions (Signed)

## 2015-08-03 NOTE — Progress Notes (Signed)
Chief Complaint:  Chief Complaint  Patient presents with  . Hypertension    Went to the ER for her blood pressure on the 14th. Still having headaches and unable to sleep at night because of them.    HPI: Mandy Webb is a 69 y.o. female who reports to [facility] today complaining of  Diffuse sharp Headache in the frontal forehead, has had similar HAs in the past. No new changes since visit to ED on 08/01/15, Still pesent, tried aleve w/o relief. She has had poor po. No nausea, weakness, , vision changes, numbness weakness or tingling, confusion or stroke like sxs. She has been on ramipril for a long time and has had no recent dose changes. Has a hx of Headaches.    BP Readings from Last 3 Encounters:  08/03/15 152/88  08/01/15 115/93  05/31/15 112/68     Past Medical History  Diagnosis Date  . Diabetes mellitus   . Hyperlipidemia   . Hypertension   . Allergy   . Asthma   . Arthritis    Past Surgical History  Procedure Laterality Date  . Appendectomy    . Pituitary surgery    . Tubal ligation    . Abdominal hysterectomy     Social History   Social History  . Marital Status: Single    Spouse Name: N/A  . Number of Children: 3  . Years of Education: N/A   Occupational History  . Retired    Social History Main Topics  . Smoking status: Former Smoker    Quit date: 02/02/1969  . Smokeless tobacco: Never Used  . Alcohol Use: No  . Drug Use: No  . Sexual Activity: No   Other Topics Concern  . None   Social History Narrative   Lives alone;   1 daughter lives in [place]   1 son lives in [place]   Family History  Problem Relation Age of Onset  . Stomach cancer Mother   . Cancer Mother     stomach cancer  . Hypertension Father   . Heart disease Brother    Allergies  Allergen Reactions  . Protonix [Pantoprazole Sodium]     unknown   Prior to Admission medications   Medication Sig Start Date End Date Taking? Authorizing Provider  ALPHAGAN P 0.1 %  SOLN  05/05/15  Yes Historical Provider, MD  atorvastatin (LIPITOR) 20 MG tablet TAKE ONE TABLET BY MOUTH DAILY 05/18/15  Yes Tereasa Coop, PA-C  Blood Glucose Monitoring Suppl (BLOOD GLUCOSE METER) kit Use as instructed 11/06/12  Yes Heather M Marte, PA-C  Blood Pressure KIT Please check you blood pressure after resting for five minutes.  Call [facility] if you pressure is consistently higher than 150/90. 05/14/15  Yes Tereasa Coop, PA-C  gabapentin (NEURONTIN) 600 MG tablet TAKE ONE TABLET BY MOUTH 4 TIMES DAILY 05/18/15  Yes Tereasa Coop, PA-C  glucose blood (ONE TOUCH ULTRA TEST) test strip Use to test blood sugar daily. Dx code: E11.9 02/11/15  Yes Barton Fanny, MD  Lancets [facility] ULTRASOFT) lancets Test blood sugar daily. Dx code: E11.9 02/11/15  Yes Barton Fanny, MD  levothyroxine (SYNTHROID, LEVOTHROID) 75 MCG tablet Take 1 tablet (75 mcg total) by mouth daily. 05/31/15  Yes Elby Beck, FNP  Linaclotide [facility]) 145 MCG CAPS capsule Take 1 capsule (145 mcg total) by mouth daily. 05/31/15  Yes Elby Beck, FNP  metoCLOPramide (REGLAN) 10 MG tablet TAKE ONE TABLET BY  MOUTH ONCE DAILY 07/26/15  Yes Milus Banister, MD  montelukast (SINGULAIR) 10 MG tablet Take 1 tablet (10 mg total) by mouth at bedtime. 05/31/15  Yes Elby Beck, FNP  ramipril (ALTACE) 5 MG capsule Take 1 capsule (5 mg total) by mouth daily. 05/31/15  Yes Elby Beck, FNP  albuterol (PROVENTIL HFA;VENTOLIN HFA) 108 (90 BASE) MCG/ACT inhaler Inhale 2 puffs into the lungs every 6 (six) hours as needed for wheezing (cough, shortness of breath or wheezing.). Patient not taking: Reported on 08/03/2015 12/16/14   Barton Fanny, MD  brinzolamide (AZOPT) 1 % ophthalmic suspension Place 1 drop into both eyes 2 (two) times daily.     Historical Provider, MD  omeprazole (PRILOSEC) 20 MG capsule TAKE ONE CAPSULE BY MOUTH ONCE DAILY Patient not taking: Reported on 08/03/2015    Barton Fanny, MD    Probiotic Product (VSL#3) CAPS Take 1 capsule by mouth 2 (two) times daily. Patient not taking: Reported on 08/03/2015 12/16/14   Barton Fanny, MD  triamcinolone (KENALOG) 0.025 % cream Apply 1 application topically 2 (two) times daily as needed (for skin). Patient not taking: Reported on 08/03/2015 12/16/14   Barton Fanny, MD     ROS: The patient denies fevers, chills, night sweats, unintentional weight loss, chest pain, palpitations, wheezing, dyspnea on exertion, nausea, vomiting, abdominal pain, dysuria, hematuria, melena, numbness, weakness, or tingling.   All other systems have been reviewed and were otherwise negative with the exception of those mentioned in the HPI and as above.    PHYSICAL EXAM: Filed Vitals:   08/03/15 0824  BP: 152/88  Pulse: 88  Temp: 98.8 F (37.1 C)  Resp: 18   Body mass index is 27.64 kg/(m^2).   General: Alert, no acute distress HEENT:  Normocephalic, atraumatic, oropharynx patent. EOMI, PERRLA. Fundoscopic exam gorssly normal. I think she has cataracts Cardiovascular:  Regular rate and rhythm, no rubs murmurs or gallops.  No Carotid bruits, radial pulse intact. No pedal edema.  Respiratory: Clear to auscultation bilaterally.  No wheezes, rales, or rhonchi.  No cyanosis, no use of accessory musculature Abdominal: No organomegaly, abdomen is soft and non-tender, positive bowel sounds. No masses. Skin: No rashes. Neurologic: Facial musculature symmetric. CN 2-12 grossly normal Psychiatric: Patient acts appropriately throughout our interaction. Lymphatic: No cervical or submandibular lymphadenopathy Musculoskeletal: Gait intact. No edema, tenderness   LABS: Results for orders placed or performed in visit on 08/03/15  POCT glucose (manual entry)  Result Value Ref Range   POC Glucose 108 (A) 70 - 99 mg/dl     EKG/XRAY:   Primary read interpreted by Dr. Marin Comment at [facility].   ASSESSMENT/PLAN: Encounter Diagnoses  Name Primary?  .  Intractable migraine with status migrainosus, unspecified migraine type Yes  . Essential hypertension   . Dehydration    Felt better after IVF x 1 Toradaol 30 mg IM x1 Felt better , no more headache after med and IVF Fu prn  No change in BP meds, recheck was 120/75  Gross sideeffects, risk and benefits, and alternatives of medications d/w patient. Patient is aware that all medications have potential sideeffects and we are unable to predict every sideeffect or drug-drug interaction that may occur.  Kaylla Cobos DO  08/03/2015 10:32 AM

## 2015-08-25 DIAGNOSIS — H35039 Hypertensive retinopathy, unspecified eye: Secondary | ICD-10-CM | POA: Diagnosis not present

## 2015-08-25 DIAGNOSIS — E119 Type 2 diabetes mellitus without complications: Secondary | ICD-10-CM | POA: Diagnosis not present

## 2015-08-25 DIAGNOSIS — H3530 Unspecified macular degeneration: Secondary | ICD-10-CM | POA: Diagnosis not present

## 2015-08-25 DIAGNOSIS — H4011X3 Primary open-angle glaucoma, severe stage: Secondary | ICD-10-CM | POA: Diagnosis not present

## 2015-08-27 ENCOUNTER — Ambulatory Visit (INDEPENDENT_AMBULATORY_CARE_PROVIDER_SITE_OTHER): Payer: Medicare Other | Admitting: Physician Assistant

## 2015-08-27 ENCOUNTER — Ambulatory Visit: Payer: Medicare Other | Admitting: Family Medicine

## 2015-08-27 VITALS — BP 132/82 | HR 77 | Temp 98.0°F | Resp 17 | Ht 59.5 in | Wt 148.0 lb

## 2015-08-27 DIAGNOSIS — E038 Other specified hypothyroidism: Secondary | ICD-10-CM

## 2015-08-27 DIAGNOSIS — I10 Essential (primary) hypertension: Secondary | ICD-10-CM | POA: Diagnosis not present

## 2015-08-27 DIAGNOSIS — E034 Atrophy of thyroid (acquired): Secondary | ICD-10-CM | POA: Diagnosis not present

## 2015-08-27 DIAGNOSIS — E119 Type 2 diabetes mellitus without complications: Secondary | ICD-10-CM

## 2015-08-27 LAB — POCT GLYCOSYLATED HEMOGLOBIN (HGB A1C): HEMOGLOBIN A1C: 5.7

## 2015-08-27 LAB — TSH: TSH: 1.109 u[IU]/mL (ref 0.350–4.500)

## 2015-08-27 NOTE — Patient Instructions (Signed)
Continue blood pressure checks. Please bring in all of your blood pressure cuffs to your appointment on 9/19. If readings are consistently high and you are concerned prior to your appointment on 9/19, stop by our appointment center to have your blood pressure checked. Continue your current blood pressure medication as you have been taking it.

## 2015-08-27 NOTE — Progress Notes (Signed)
Mandy Webb  MRN: [id number] DOB: 04-13-1946  Subjective:  Pt presents to clinic for evaluation of hypertension and elevated BP at home. She was scheduled to have an appointment with her PCP today but it was rescheduled to 9/19 and she did not want to wait that long to be seen. She brings with her a log of her blood pressure over the past two days. The highest reading is 206/103. Other pressure readings are 197/109, 205/101, and 158/96. She is taking Ramipril 5 mg daily and has been on this medication for quite a few years. Her PCP monitors her HTN and medication. She was in the ER in May for high blood pressures. She complains of headaches over the past couple of days when her BP was high. The headaches were relieved by Aleve. She describes the headache as "pressure" and located in the frontal and temporal region. No blurry vision. No light-headedness or dizziness. No chest pain or shortness of breath.She had an eye exam yesterday with Dr. Venetia Maxon, and was told to follow-up in 3 months. She has a hx of DM Type II which she says is diet-controlled. She says her PCP took her off of her diabetes medication because her A1C was good. She does not smoke.  She has had a few episode of stress with travel and trouble falling asleep.  It is not daily.  She has started to use sleepy time tea and tumeric and those help her get to sleep and stay asleep all night.  She has been continuing her healthy eating habits to hopefully stay off DM medications.   Patient Active Problem List   Diagnosis Date Noted  . Carotid artery disease without cerebral infarction 03/24/2015  . Lumbar degenerative disc disease 10/31/2014  . NEOPLASM UNCERTAIN BEHAVIOR STOMACH INTEST&RECT 07/26/2010  . NONSPECIFIC ABN FINDING RAD & OTH EXAM GI TRACT 07/26/2010  . DYSPEPSIA 05/11/2010  . COLONIC POLYPS, HX OF 03/02/2009  . Constipation 06/09/2008  . HEPATITIS C 07/29/2007  . PITUITARY ADENOMA 07/29/2007  . Hypothyroidism  07/29/2007  . DM (diabetes mellitus), type 2 07/29/2007  . RESTLESS LEG SYNDROME 07/29/2007  . PERIPHERAL NEUROPATHY 07/29/2007  . Essential hypertension 07/29/2007  . Asthma 07/29/2007  . GERD 07/29/2007    Current Outpatient Prescriptions on File Prior to Visit  Medication Sig Dispense Refill  . albuterol (PROVENTIL HFA;VENTOLIN HFA) 108 (90 BASE) MCG/ACT inhaler Inhale 2 puffs into the lungs every 6 (six) hours as needed for wheezing (cough, shortness of breath or wheezing.). 1 Inhaler 5  . ALPHAGAN P 0.1 % SOLN     . atorvastatin (LIPITOR) 20 MG tablet TAKE ONE TABLET BY MOUTH DAILY 90 tablet 1  . Blood Glucose Monitoring Suppl (BLOOD GLUCOSE METER) kit Use as instructed 1 each 0  . Blood Pressure KIT Please check you blood pressure after resting for five minutes.  Call [facility] if you pressure is consistently higher than 150/90. 1 each 0  . brinzolamide (AZOPT) 1 % ophthalmic suspension Place 1 drop into both eyes 2 (two) times daily.     Marland Kitchen gabapentin (NEURONTIN) 600 MG tablet TAKE ONE TABLET BY MOUTH 4 TIMES DAILY 360 tablet 1  . glucose blood (ONE TOUCH ULTRA TEST) test strip Use to test blood sugar daily. Dx code: E11.9 100 each 2  . Lancets (ONETOUCH ULTRASOFT) lancets Test blood sugar daily. Dx code: E11.9 100 each 3  . levothyroxine (SYNTHROID, LEVOTHROID) 75 MCG tablet Take 1 tablet (75 mcg total) by mouth daily. 90 tablet 1  .  Linaclotide (LINZESS) 145 MCG CAPS capsule Take 1 capsule (145 mcg total) by mouth daily. 30 capsule 5  . metoCLOPramide (REGLAN) 10 MG tablet TAKE ONE TABLET BY MOUTH ONCE DAILY 90 tablet 0  . montelukast (SINGULAIR) 10 MG tablet Take 1 tablet (10 mg total) by mouth at bedtime. 90 tablet 1  . omeprazole (PRILOSEC) 20 MG capsule TAKE ONE CAPSULE BY MOUTH ONCE DAILY 30 capsule 3  . Probiotic Product (VSL#3) CAPS Take 1 capsule by mouth 2 (two) times daily. 60 capsule 5  . ramipril (ALTACE) 5 MG capsule Take 1 capsule (5 mg total) by mouth daily. 90 capsule 1    . triamcinolone (KENALOG) 0.025 % cream Apply 1 application topically 2 (two) times daily as needed (for skin). 30 g 3   No current facility-administered medications on file prior to visit.    Allergies  Allergen Reactions  . Protonix [Pantoprazole Sodium]     unknown    Review of Systems  Constitutional: Negative.  Negative for fever and chills.  Respiratory: Negative for shortness of breath.   Cardiovascular: Negative for chest pain, palpitations and leg swelling.  Neurological: Positive for headaches (when her BP is high but it is relieved with OTC).  Psychiatric/Behavioral: Positive for sleep disturbance (intermittently).   Objective:  BP 132/82 mmHg  Pulse 77  Temp(Src) 98 F (36.7 C) (Oral)  Resp 17  Ht 4' 11.5" (1.511 m)  Wt 148 lb (67.132 kg)  BMI 29.40 kg/m2  SpO2 98%  Physical Exam  Constitutional: She is oriented to person, place, and time and well-developed, well-nourished, and in no distress.  HENT:  Head: Normocephalic and atraumatic.  Right Ear: Hearing and external ear normal.  Left Ear: Hearing and external ear normal.  Eyes: Conjunctivae are normal.  Neck: Normal range of motion.  Cardiovascular: Normal rate, regular rhythm and normal heart sounds.   No murmur heard. Pulmonary/Chest: Effort normal and breath sounds normal.  Musculoskeletal:       Right lower leg: She exhibits no edema.       Left lower leg: She exhibits no edema.  Neurological: She is alert and oriented to person, place, and time. Gait normal.  Skin: Skin is warm and dry.  Psychiatric: Mood, memory, affect and judgment normal.  Vitals reviewed.  Results for orders placed or performed in visit on 08/27/15  POCT glycosylated hemoglobin (Hb A1C)  Result Value Ref Range   Hemoglobin A1C 5.7     Assessment and Plan :  Essential hypertension - pt BP here in the office is good.  I am concerned about the accuracy of her BP monitor so she will bring her cuff the next visit on 9/19.   With the readings that we got today I do not want to adjust her medication due to the high risk of of hypotension.   Hypothyroidism due to acquired atrophy of thyroid - Plan: TSH - check labs because in 03/2015 her medication was changed due to overmedication at that time.  Type 2 diabetes mellitus without complication - Plan: POCT glycosylated hemoglobin (Hb A1C) - still in good control with diet only - she will continue her current lifestyle choices that she has been doing but we will continue to monitor     PA-C  [facility] 08/27/2015 9:19 AM   

## 2015-08-27 NOTE — Progress Notes (Signed)
Subjective:    Patient ID: Mandy Webb, female    DOB: 06-Mar-1946, 69 y.o.   MRN: [id number]   PCP: Elmer Bales, MD  Chief Complaint  Patient presents with  . Hypertension   Patient Active Problem List   Diagnosis Date Noted  . Carotid artery disease without cerebral infarction 03/24/2015  . Lumbar degenerative disc disease 10/31/2014  . NEOPLASM UNCERTAIN BEHAVIOR STOMACH INTEST&RECT 07/26/2010  . NONSPECIFIC ABN FINDING RAD & OTH EXAM GI TRACT 07/26/2010  . DYSPEPSIA 05/11/2010  . COLONIC POLYPS, HX OF 03/02/2009  . Constipation 06/09/2008  . HEPATITIS C 07/29/2007  . PITUITARY ADENOMA 07/29/2007  . Hypothyroidism 07/29/2007  . DIABETES MELLITUS, TYPE II 07/29/2007  . RESTLESS LEG SYNDROME 07/29/2007  . PERIPHERAL NEUROPATHY 07/29/2007  . Essential hypertension 07/29/2007  . Asthma 07/29/2007  . GERD 07/29/2007     HPI  Pt presents today for evaluation of hypertension. She was scheduled to have an appointment with her PCP today at the appointment center but it was rescheduled to 9/19 and she did not want to wait that long to be seen. Her HTN and medication is monitored at the appointment center. She brings with her a log of her blood pressure over the past two days. The highest reading is 206/103. Other pressure readings are 197/109, 205/101, and 158/96. She is taking Ramipril 5 mg daily and has been on this medication for quite a few years. She states she was in the ER in July and August for high blood pressures. She complains of headaches over the past couple of days when her BP was high. The headaches were relieved by Aleve. She describes the headache as "pressure" and located in the frontal and temporal region. No blurry vision. No light-headedness or dizziness. No chest pain or shortness of breath. She had an eye exam yesterday with Dr. Venetia Maxon, and was told to follow-up in 3 months. She has a hx of DM Type II which she says is diet-controlled. She says she was  taken off of her diabetes medication because her A1C was good. She does not smoke.  Review of Systems  Constitutional: Negative for fever and chills.  Eyes: Negative for visual disturbance.  Respiratory: Negative for shortness of breath.   Cardiovascular: Negative for chest pain.  Genitourinary: Positive for frequency ("nothing new, this has been going on for awhile"). Negative for dysuria and urgency.  Neurological: Positive for headaches. Negative for dizziness, weakness and light-headedness.     Current outpatient prescriptions:  .  albuterol (PROVENTIL HFA;VENTOLIN HFA) 108 (90 BASE) MCG/ACT inhaler, Inhale 2 puffs into the lungs every 6 (six) hours as needed for wheezing (cough, shortness of breath or wheezing.)., Disp: 1 Inhaler, Rfl: 5 .  ALPHAGAN P 0.1 % SOLN, , Disp: , Rfl:  .  atorvastatin (LIPITOR) 20 MG tablet, TAKE ONE TABLET BY MOUTH DAILY, Disp: 90 tablet, Rfl: 1 .  Blood Glucose Monitoring Suppl (BLOOD GLUCOSE METER) kit, Use as instructed, Disp: 1 each, Rfl: 0 .  Blood Pressure KIT, Please check you blood pressure after resting for five minutes.  Call [facility] if you pressure is consistently higher than 150/90., Disp: 1 each, Rfl: 0 .  brinzolamide (AZOPT) 1 % ophthalmic suspension, Place 1 drop into both eyes 2 (two) times daily. , Disp: , Rfl:  .  gabapentin (NEURONTIN) 600 MG tablet, TAKE ONE TABLET BY MOUTH 4 TIMES DAILY, Disp: 360 tablet, Rfl: 1 .  glucose blood (ONE TOUCH ULTRA TEST) test strip, Use to test blood  sugar daily. Dx code: E11.9, Disp: 100 each, Rfl: 2 .  Lancets (ONETOUCH ULTRASOFT) lancets, Test blood sugar daily. Dx code: E11.9, Disp: 100 each, Rfl: 3 .  levothyroxine (SYNTHROID, LEVOTHROID) 75 MCG tablet, Take 1 tablet (75 mcg total) by mouth daily., Disp: 90 tablet, Rfl: 1 .  Linaclotide (LINZESS) 145 MCG CAPS capsule, Take 1 capsule (145 mcg total) by mouth daily., Disp: 30 capsule, Rfl: 5 .  metoCLOPramide (REGLAN) 10 MG tablet, TAKE ONE TABLET BY MOUTH  ONCE DAILY, Disp: 90 tablet, Rfl: 0 .  montelukast (SINGULAIR) 10 MG tablet, Take 1 tablet (10 mg total) by mouth at bedtime., Disp: 90 tablet, Rfl: 1 .  omeprazole (PRILOSEC) 20 MG capsule, TAKE ONE CAPSULE BY MOUTH ONCE DAILY, Disp: 30 capsule, Rfl: 3 .  Probiotic Product (VSL#3) CAPS, Take 1 capsule by mouth 2 (two) times daily., Disp: 60 capsule, Rfl: 5 .  ramipril (ALTACE) 5 MG capsule, Take 1 capsule (5 mg total) by mouth daily., Disp: 90 capsule, Rfl: 1 .  triamcinolone (KENALOG) 0.025 % cream, Apply 1 application topically 2 (two) times daily as needed (for skin)., Disp: 30 g, Rfl: 3   Allergies  Allergen Reactions  . Protonix [Pantoprazole Sodium]     unknown       Objective:   Physical Exam  Constitutional: She is oriented to person, place, and time. She appears well-developed and well-nourished.  HENT:  Head: Normocephalic and atraumatic.  Mouth/Throat: Oropharynx is clear and moist.  Eyes: Conjunctivae are normal. Pupils are equal, round, and reactive to light.  Neck: Normal range of motion. Neck supple. Carotid bruit is not present. No thyromegaly present.  Cardiovascular: Normal rate, regular rhythm and normal heart sounds.  Exam reveals no gallop and no friction rub.   No murmur heard. Cap refill <3 seconds. No peripheral edema.   Pulmonary/Chest: Effort normal and breath sounds normal.  Lymphadenopathy:    She has no cervical adenopathy.  Neurological: She is alert and oriented to person, place, and time.  Skin: Skin is warm and dry.  Psychiatric: She has a normal mood and affect. Her behavior is normal.     BP 132/82 mmHg  Pulse 77  Temp(Src) 98 F (36.7 C) (Oral)  Resp 17  Ht 4' 11.5" (1.511 m)  Wt 148 lb (67.132 kg)  BMI 29.40 kg/m2  SpO2 98%    Results for orders placed or performed in visit on 08/27/15  POCT glycosylated hemoglobin (Hb A1C)  Result Value Ref Range   Hemoglobin A1C 5.7      Assessment & Plan:  1. Essential  hypertension Patient's high blood pressure readings at home may be due to inaccuracy of blood pressure cuff, as BP here is 132/82. Patient to follow-up with PCP at appointment on 9/19. No changes made to BP medications. If patient is worried about readings before appointment on 9/19, counseled to return to appointment center to have BP checked.  2. Hypothyroidism due to acquired atrophy of thyroid Last TSH check was April which showed hyperthyroidism and subsequent adjustment of medications. Rechecking TSH today. - TSH  3. Type 2 diabetes mellitus without complication Diet-controlled. Most recent A1C was 03/23/15 and was 5.8. Will recheck today. - POCT glycosylated hemoglobin (Hb A1C)

## 2015-09-06 ENCOUNTER — Encounter: Payer: Self-pay | Admitting: Family Medicine

## 2015-09-06 ENCOUNTER — Ambulatory Visit (INDEPENDENT_AMBULATORY_CARE_PROVIDER_SITE_OTHER): Payer: Medicare Other | Admitting: Family Medicine

## 2015-09-06 VITALS — BP 136/84 | HR 74 | Temp 98.4°F | Resp 16 | Wt 144.8 lb

## 2015-09-06 DIAGNOSIS — Z23 Encounter for immunization: Secondary | ICD-10-CM | POA: Diagnosis not present

## 2015-09-06 DIAGNOSIS — E119 Type 2 diabetes mellitus without complications: Secondary | ICD-10-CM | POA: Diagnosis not present

## 2015-09-06 DIAGNOSIS — K5900 Constipation, unspecified: Secondary | ICD-10-CM | POA: Diagnosis not present

## 2015-09-06 NOTE — Progress Notes (Signed)
   Subjective:    Patient ID: Mandy Webb, female    DOB: [birth year], 69 y.o.   MRN: [id number]  HPI Patient presents today for follow up of HTN. She was in the ED last month with elevated blood pressure and headache. She was then seen 08/27/15 because she was concerned about her home BP readings being elevated. In the clinic her BP was 132/82.   She has a history of DM type 2. She has been watching her diet since 1/16 and she was able to come off her metfomin 4/16. HgbA1C 08/27/15 was 5.7.   She continues to have constipation with BM every 2-3 days, she takes Linzess PRN, probiotics and eats fiber crackers. Good water intake.   Had eye exam last week- everything was fine.  Past Medical History  Diagnosis Date  . Diabetes mellitus   . Hyperlipidemia   . Hypertension   . Allergy   . Asthma   . Arthritis    Past Surgical History  Procedure Laterality Date  . Appendectomy    . Pituitary surgery    . Tubal ligation    . Abdominal hysterectomy     Family History  Problem Relation Age of Onset  . Stomach cancer Mother   . Cancer Mother     stomach cancer  . Hypertension Father   . Heart disease Brother    Social History  Substance Use Topics  . Smoking status: Former Smoker    Quit date: 02/02/1969  . Smokeless tobacco: Never Used  . Alcohol Use: No     Review of Systems No chest pain, no edema, hasn't needed albuterol. Sleeps well with turmeric and sleepy time tea.     Objective:   Physical Exam Physical Exam  Constitutional: Oriented to person, place, and time. She appears well-developed and well-nourished.  HENT:  Head: Normocephalic and atraumatic.  Eyes: Conjunctivae are normal.  Neck: Normal range of motion. Neck supple.  Cardiovascular: Normal rate, regular rhythm and normal heart sounds.   Pulmonary/Chest: Effort normal and breath sounds normal.  Musculoskeletal: She uses cane for ambulation Neurological: Alert and oriented to person, place, and time.    Skin: Skin is warm and dry.  Psychiatric: Normal mood and affect. Behavior is normal. Judgment and thought content normal.  Vitals reviewed.  BP 136/84 mmHg  Pulse 74  Temp(Src) 98.4 F (36.9 C) (Oral)  Resp 16  Wt 144 lb 12.8 oz (65.681 kg) Wt Readings from Last 3 Encounters:  09/06/15 144 lb 12.8 oz (65.681 kg)  08/27/15 148 lb (67.132 kg)  08/03/15 146 lb 3.2 oz (66.316 kg)      Assessment & Plan:  1. Need for prophylactic vaccination and inoculation against influenza  2. Type 2 diabetes mellitus without complication - patient will continue to watch diet and aim for slow and steady weight loss.  3. Constipation, unspecified constipation type - encouraged her to continue good fluid intake, high fiber diet and discussed taking Linzess daily as needed before breakfast.   4. Essential HTN - continue ramipril - reassured patient of normal readings  Follow up in 3 months, will check lipids at that time. Patient instructed to come in fasting.  Clarene Reamer, FNP-BC  [facility]  09/06/2015 12:17 PM

## 2015-09-06 NOTE — Patient Instructions (Signed)
Come in fasting at your next appointment and we will check your cholesterol

## 2015-10-10 ENCOUNTER — Telehealth: Payer: Self-pay | Admitting: Family Medicine

## 2015-10-10 NOTE — Telephone Encounter (Signed)
Pt is cancelling her appt with Carlean Purl  On 12/06/15 she's having issurance issues

## 2015-10-22 ENCOUNTER — Other Ambulatory Visit: Payer: Self-pay | Admitting: Physician Assistant

## 2015-10-27 ENCOUNTER — Ambulatory Visit: Payer: Medicare Other | Admitting: Podiatry

## 2015-10-28 ENCOUNTER — Encounter: Payer: Self-pay | Admitting: Podiatry

## 2015-10-28 ENCOUNTER — Ambulatory Visit (INDEPENDENT_AMBULATORY_CARE_PROVIDER_SITE_OTHER): Payer: Medicare Other | Admitting: Podiatry

## 2015-10-28 DIAGNOSIS — B351 Tinea unguium: Secondary | ICD-10-CM | POA: Diagnosis not present

## 2015-10-28 DIAGNOSIS — E1140 Type 2 diabetes mellitus with diabetic neuropathy, unspecified: Secondary | ICD-10-CM

## 2015-10-28 DIAGNOSIS — M79673 Pain in unspecified foot: Secondary | ICD-10-CM | POA: Diagnosis not present

## 2015-10-28 NOTE — Progress Notes (Signed)
Patient ID: Mandy Webb, female   DOB: 11/08/1946, 69 y.o.   MRN: [id number] Complaint:  Visit Type: Patient returns to my office for continued preventative foot care services. Complaint: Patient states" my nails have grown long and thick and become painful to walk and wear shoes" .Patient is diabetic with neuropathy. The patient presents for preventative foot care services. No changes to ROS  Podiatric Exam: Vascular: dorsalis pedis and posterior tibial pulses are palpable bilateral. Capillary return is immediate. Temperature gradient is WNL. Skin turgor WNL  Sensorium: Normal Semmes Weinstein monofilament test. Normal tactile sensation bilaterally. Nail Exam: Pt has thick disfigured discolored nails with subungual debris noted bilateral entire nail hallux through fifth toenails Ulcer Exam: There is no evidence of ulcer or pre-ulcerative changes or infection. Orthopedic Exam: Muscle tone and strength are WNL. No limitations in general ROM. No crepitus or effusions noted. Foot type and digits show no abnormalities. Bony prominences are unremarkable. Skin: No Porokeratosis. No infection or ulcers  Diagnosis:  Onychomycosis, , Pain in right toe, pain in left toes  Treatment & Plan Procedures and Treatment: Consent by patient was obtained for treatment procedures. The patient understood the discussion of treatment and procedures well. All questions were answered thoroughly reviewed. Debridement of mycotic and hypertrophic toenails, 1 through 5 bilateral and clearing of subungual debris. No ulceration, no infection noted.  Return Visit-Office Procedure: Patient instructed to return to the office for a follow up visit 3 months for continued evaluation and treatment. 

## 2015-10-29 DIAGNOSIS — H26239 Glaucomatous flecks (subcapsular), unspecified eye: Secondary | ICD-10-CM | POA: Diagnosis not present

## 2015-10-29 DIAGNOSIS — I10 Essential (primary) hypertension: Secondary | ICD-10-CM | POA: Diagnosis not present

## 2015-10-29 DIAGNOSIS — J4520 Mild intermittent asthma, uncomplicated: Secondary | ICD-10-CM | POA: Diagnosis not present

## 2015-10-29 DIAGNOSIS — E039 Hypothyroidism, unspecified: Secondary | ICD-10-CM | POA: Diagnosis not present

## 2015-10-29 DIAGNOSIS — R7309 Other abnormal glucose: Secondary | ICD-10-CM | POA: Diagnosis not present

## 2015-10-29 DIAGNOSIS — E7800 Pure hypercholesterolemia, unspecified: Secondary | ICD-10-CM | POA: Diagnosis not present

## 2015-10-29 DIAGNOSIS — E785 Hyperlipidemia, unspecified: Secondary | ICD-10-CM | POA: Diagnosis not present

## 2015-11-04 ENCOUNTER — Other Ambulatory Visit: Payer: Self-pay | Admitting: Physician Assistant

## 2015-11-19 DIAGNOSIS — Z0000 Encounter for general adult medical examination without abnormal findings: Secondary | ICD-10-CM | POA: Diagnosis not present

## 2015-11-23 ENCOUNTER — Other Ambulatory Visit: Payer: Self-pay | Admitting: Family Medicine

## 2015-12-06 ENCOUNTER — Ambulatory Visit: Payer: Medicare Other | Admitting: Family Medicine

## 2015-12-25 ENCOUNTER — Other Ambulatory Visit: Payer: Self-pay | Admitting: Family Medicine

## 2015-12-25 ENCOUNTER — Other Ambulatory Visit: Payer: Self-pay | Admitting: Gastroenterology

## 2015-12-27 NOTE — Telephone Encounter (Signed)
Pt requesting Lialda refills. Last office visit 09-29-2013. Please advise.

## 2015-12-29 ENCOUNTER — Other Ambulatory Visit: Payer: Self-pay

## 2015-12-29 DIAGNOSIS — J4520 Mild intermittent asthma, uncomplicated: Secondary | ICD-10-CM

## 2015-12-29 MED ORDER — MONTELUKAST SODIUM 10 MG PO TABS: 10.0000 mg | ORAL_TABLET | Freq: Every day | ORAL | Status: DC

## 2015-12-29 NOTE — Telephone Encounter (Signed)
Mandy Webb, pt was in for check up in Sept, but don't see asthma discussed. Is it OK to RF this for 90 days?

## 2016-01-08 DIAGNOSIS — I10 Essential (primary) hypertension: Secondary | ICD-10-CM | POA: Diagnosis not present

## 2016-01-08 DIAGNOSIS — E039 Hypothyroidism, unspecified: Secondary | ICD-10-CM | POA: Diagnosis not present

## 2016-01-17 DIAGNOSIS — H401132 Primary open-angle glaucoma, bilateral, moderate stage: Secondary | ICD-10-CM | POA: Diagnosis not present

## 2016-01-17 DIAGNOSIS — H2513 Age-related nuclear cataract, bilateral: Secondary | ICD-10-CM | POA: Diagnosis not present

## 2016-01-24 ENCOUNTER — Other Ambulatory Visit: Payer: Self-pay | Admitting: Family Medicine

## 2016-01-24 ENCOUNTER — Other Ambulatory Visit: Payer: Self-pay | Admitting: Physician Assistant

## 2016-01-24 DIAGNOSIS — H903 Sensorineural hearing loss, bilateral: Secondary | ICD-10-CM | POA: Diagnosis not present

## 2016-01-27 ENCOUNTER — Encounter: Payer: Self-pay | Admitting: Podiatry

## 2016-01-27 ENCOUNTER — Ambulatory Visit (INDEPENDENT_AMBULATORY_CARE_PROVIDER_SITE_OTHER): Payer: Medicare Other | Admitting: Podiatry

## 2016-01-27 DIAGNOSIS — E1140 Type 2 diabetes mellitus with diabetic neuropathy, unspecified: Secondary | ICD-10-CM | POA: Diagnosis not present

## 2016-01-27 DIAGNOSIS — B351 Tinea unguium: Secondary | ICD-10-CM

## 2016-01-27 DIAGNOSIS — M79673 Pain in unspecified foot: Secondary | ICD-10-CM | POA: Diagnosis not present

## 2016-01-27 NOTE — Progress Notes (Signed)
Patient ID: Mandy Webb, female   DOB: 29-Sep-1946, 70 y.o.   MRN: [id number] Complaint:  Visit Type: Patient returns to my office for continued preventative foot care services. Complaint: Patient states" my nails have grown long and thick and become painful to walk and wear shoes" .Patient is diabetic with neuropathy. The patient presents for preventative foot care services. No changes to ROS  Podiatric Exam: Vascular: dorsalis pedis and posterior tibial pulses are palpable bilateral. Capillary return is immediate. Temperature gradient is WNL. Skin turgor WNL  Sensorium: Normal Semmes Weinstein monofilament test. Normal tactile sensation bilaterally. Nail Exam: Pt has thick disfigured discolored nails with subungual debris noted bilateral entire nail hallux through fifth toenails Ulcer Exam: There is no evidence of ulcer or pre-ulcerative changes or infection. Orthopedic Exam: Muscle tone and strength are WNL. No limitations in general ROM. No crepitus or effusions noted. Foot type and digits show no abnormalities. Bony prominences are unremarkable. Skin: No Porokeratosis. No infection or ulcers  Diagnosis:  Onychomycosis, , Pain in right toe, pain in left toes  Treatment & Plan Procedures and Treatment: Consent by patient was obtained for treatment procedures. The patient understood the discussion of treatment and procedures well. All questions were answered thoroughly reviewed. Debridement of mycotic and hypertrophic toenails, 1 through 5 bilateral and clearing of subungual debris. No ulceration, no infection noted. There is brownish macule left heel with no inflammation.  Possible insect bite.  No inflammation. Return Visit-Office Procedure: Patient instructed to return to the office for a follow up visit 3 months for continued evaluation and treatment.   Gardiner Barefoot DPM

## 2016-02-02 ENCOUNTER — Other Ambulatory Visit: Payer: Self-pay | Admitting: Family Medicine

## 2016-02-02 ENCOUNTER — Other Ambulatory Visit (HOSPITAL_COMMUNITY): Payer: Self-pay | Admitting: Family Medicine

## 2016-02-02 DIAGNOSIS — I6523 Occlusion and stenosis of bilateral carotid arteries: Secondary | ICD-10-CM

## 2016-02-03 ENCOUNTER — Other Ambulatory Visit: Payer: Self-pay | Admitting: Family Medicine

## 2016-02-08 DIAGNOSIS — H903 Sensorineural hearing loss, bilateral: Secondary | ICD-10-CM | POA: Diagnosis not present

## 2016-02-10 ENCOUNTER — Ambulatory Visit (HOSPITAL_COMMUNITY)
Admission: RE | Admit: 2016-02-10 | Discharge: 2016-02-10 | Disposition: A | Discharge status: Home / Self Care | Payer: Medicare Other | Source: Ambulatory Visit | Attending: Cardiology | Admitting: Cardiology

## 2016-02-10 DIAGNOSIS — I10 Essential (primary) hypertension: Secondary | ICD-10-CM | POA: Insufficient documentation

## 2016-02-10 DIAGNOSIS — I6523 Occlusion and stenosis of bilateral carotid arteries: Secondary | ICD-10-CM

## 2016-02-10 DIAGNOSIS — E119 Type 2 diabetes mellitus without complications: Secondary | ICD-10-CM | POA: Insufficient documentation

## 2016-02-10 DIAGNOSIS — E785 Hyperlipidemia, unspecified: Secondary | ICD-10-CM | POA: Diagnosis not present

## 2016-02-14 ENCOUNTER — Other Ambulatory Visit: Payer: Self-pay | Admitting: Physician Assistant

## 2016-02-21 ENCOUNTER — Other Ambulatory Visit: Payer: Self-pay | Admitting: Physician Assistant

## 2016-02-21 ENCOUNTER — Other Ambulatory Visit: Payer: Self-pay | Admitting: Family Medicine

## 2016-02-24 ENCOUNTER — Telehealth: Payer: Self-pay | Admitting: Gastroenterology

## 2016-02-24 NOTE — Telephone Encounter (Signed)
Pt aware that she can take the miralax at her convenience, no special timing is needed.  She has a follow up with Dr Ardis Hughs 04/03/16 and she will keep that as scheduled

## 2016-02-28 ENCOUNTER — Telehealth: Payer: Self-pay | Admitting: Gastroenterology

## 2016-02-28 MED ORDER — POLYETHYLENE GLYCOL 3350 17 GM/SCOOP PO POWD: 17.0000 g | Freq: Every day | ORAL | Status: DC

## 2016-02-28 NOTE — Telephone Encounter (Signed)
Pt aware no further refills unless appt is kept.  Miralax 17 g daily was sent to [facility]

## 2016-03-02 ENCOUNTER — Other Ambulatory Visit: Payer: Self-pay

## 2016-03-02 MED ORDER — VSL#3 PO CAPS: 1.0000 | ORAL_CAPSULE | Freq: Two times a day (BID) | ORAL | Status: DC

## 2016-03-11 ENCOUNTER — Other Ambulatory Visit: Payer: Self-pay | Admitting: Family Medicine

## 2016-03-14 DIAGNOSIS — D352 Benign neoplasm of pituitary gland: Secondary | ICD-10-CM | POA: Diagnosis not present

## 2016-03-16 DIAGNOSIS — D352 Benign neoplasm of pituitary gland: Secondary | ICD-10-CM | POA: Diagnosis not present

## 2016-03-17 ENCOUNTER — Other Ambulatory Visit: Payer: Self-pay | Admitting: Gastroenterology

## 2016-03-30 ENCOUNTER — Other Ambulatory Visit: Payer: Self-pay | Admitting: Physician Assistant

## 2016-03-30 ENCOUNTER — Other Ambulatory Visit: Payer: Self-pay | Admitting: Gastroenterology

## 2016-04-03 ENCOUNTER — Encounter: Payer: Self-pay | Admitting: Gastroenterology

## 2016-04-03 ENCOUNTER — Ambulatory Visit (INDEPENDENT_AMBULATORY_CARE_PROVIDER_SITE_OTHER): Payer: Medicare Other | Admitting: Gastroenterology

## 2016-04-03 ENCOUNTER — Other Ambulatory Visit: Payer: Self-pay

## 2016-04-03 VITALS — BP 110/60 | HR 72 | Ht 59.5 in | Wt 144.4 lb

## 2016-04-03 DIAGNOSIS — K5900 Constipation, unspecified: Secondary | ICD-10-CM

## 2016-04-03 MED ORDER — POLYETHYLENE GLYCOL 3350 17 GM/SCOOP PO POWD: ORAL | Status: DC

## 2016-04-03 NOTE — Patient Instructions (Signed)
Continue linzess once daily. Change your miralax to 1/2 dose every single day. Call in 4-5 weeks to report on your response.

## 2016-04-03 NOTE — Progress Notes (Signed)
Review of pertinent gastrointestinal problems:  1. History of colon polyps/colonoscopy 2005 in [place] (86m polyp),Colonoscopy 2010 found no polyps or cancers, positive melanosis throughout colon. ++ Family history for colon cancer. Surveillance colonoscopy 04/2014 Dr. JArdis Hughsfound no polyps, + diverticulosis, recommended recall colonoscopy at 5 years. 2. Questionable history of stomach polyps or Barrett's. [place] esophagogastroduodenoscopy by Dr. JArdis HughsJanuary 2007 showed normal upper gastrointestinal evaluation. No Barrett's, no polyps.  3. Chronic constipation. Well treated with a variety of agents, including an herbal medicine that she takes, aloe vera, Miralax p.r.n.  4. intermitent abd pains: CT 08/2008 essentially negative, felt this was from chronic constipation. Ultrasound abdomen on November 16, 2009, showed diffuse fatty infiltration of the liver, otherwise unremarkable. HIDA scan on December, 2010, showed normal gallbladder ejection fraction, otherwise an unremarkable study. CT scan 03/2013: mild constipation, otherwise normal. 5. abnormal gastric biopsies, June 2011. Moderate to severe gastritis noted. Biopsies taken. No H. pylori however pathology suggested possible carcinoid reaction. July 2011; August, 2011 repeat EGD performed; pan gastritis, pathology showed gastritis EC hyperplasia, NE features. Octreotide scan normal. 2011, intrinsic factor antibodies negative. EGD 07/2012 ([facility]) 1) Small hiatal hernia 2) Moderate pan gastritis. Biopsies taken 3) Otherwise normal examination. Biopsies showed no IM, only mild chronic gastritis without H. Pylori.   HPI: This is a  very pleasant 70year old woman whom I last saw 1 or 2 years ago time of colonoscopy  Chief complaint is chronic constipation  Tells me her Mother had colon cancer, not her father.  She still has problems with constipation.  She stopped miralax (by my request I believe). She found  that she was more constipated after stopping it.  She takes linzess one pill daily.  She recently started taking magnesium for low back pains. This has not improved her bowels. She has not seen blood in her stool. She is not losing weight.    Past Medical History  Diagnosis Date  . Diabetes mellitus   . Hyperlipidemia   . Hypertension   . Allergy   . Asthma   . Arthritis     Past Surgical History  Procedure Laterality Date  . Appendectomy    . Pituitary surgery    . Tubal ligation    . Abdominal hysterectomy      Current Outpatient Prescriptions  Medication Sig Dispense Refill  . albuterol (PROVENTIL HFA;VENTOLIN HFA) 108 (90 BASE) MCG/ACT inhaler Inhale 2 puffs into the lungs every 6 (six) hours as needed for wheezing (cough, shortness of breath or wheezing.). 1 Inhaler 5  . atorvastatin (LIPITOR) 20 MG tablet TAKE ONE TABLET BY MOUTH ONCE DAILY 30 tablet 0  . Blood Glucose Monitoring Suppl (BLOOD GLUCOSE METER) kit Use as instructed 1 each 0  . Blood Pressure KIT Please check you blood pressure after resting for five minutes.  Call [facility] if you pressure is consistently higher than 150/90. 1 each 0  . brinzolamide (AZOPT) 1 % ophthalmic suspension Place 1 drop into both eyes 2 (two) times daily.     .Marland Kitchengabapentin (NEURONTIN) 600 MG tablet TAKE ONE TABLET BY MOUTH 4 TIMES DAILY 120 tablet 0  . glucose blood (ONE TOUCH ULTRA TEST) test strip Use to test blood sugar daily. Dx code: E11.9 100 each 2  . indapamide (LOZOL) 1.25 MG tablet     . Lancets (ONETOUCH ULTRASOFT) lancets Test blood sugar daily. Dx code: E11.9 100 each 3  . levothyroxine (SYNTHROID, LEVOTHROID) 75 MCG tablet Take  1 tablet (75 mcg total) by mouth daily. 90 tablet 1  . LINZESS 145 MCG CAPS capsule TAKE ONE CAPSULE BY MOUTH DAILY. 90 capsule 0  . metoCLOPramide (REGLAN) 10 MG tablet TAKE ONE TABLET BY MOUTH ONCE DAILY 90 tablet 0  . montelukast (SINGULAIR) 10 MG tablet TAKE ONE TABLET BY MOUTH ONCE DAILY AT BEDTIME  90 tablet 0  . omeprazole (PRILOSEC) 20 MG capsule TAKE ONE CAPSULE BY MOUTH ONCE DAILY 30 capsule 3  . polyethylene glycol powder (GLYCOLAX/MIRALAX) powder TAKE 17 G BY MOUTH DAILY 765 g 0  . ramipril (ALTACE) 5 MG capsule TAKE ONE CAPSULE BY MOUTH ONCE DAILY 30 capsule 0  . triamcinolone (KENALOG) 0.025 % cream Apply 1 application topically 2 (two) times daily as needed (for skin). 30 g 3   No current facility-administered medications for this visit.    Allergies as of 04/03/2016 - Review Complete 04/03/2016  Allergen Reaction Noted  . Protonix [pantoprazole sodium]  05/21/2013    Family History  Problem Relation Age of Onset  . Stomach cancer Mother   . Cancer Mother     stomach cancer  . Hypertension Father   . Heart disease Brother     Social History   Social History  . Marital Status: Single    Spouse Name: N/A  . Number of Children: 3  . Years of Education: N/A   Occupational History  . Retired    Social History Main Topics  . Smoking status: Former Smoker    Quit date: 02/02/1969  . Smokeless tobacco: Never Used  . Alcohol Use: No  . Drug Use: No  . Sexual Activity: No   Other Topics Concern  . Not on file   Social History Narrative   Lives alone;   1 daughter lives in [place]   1 son lives in [place]     Physical Exam: BP 110/60 mmHg  Pulse 72  Ht 4' 11.5" (1.511 m)  Wt 144 lb 6.4 oz (65.499 kg)  BMI 28.69 kg/m2 Constitutional: generally well-appearing Psychiatric: alert and oriented x3 Abdomen: soft, nontender, nondistended, no obvious ascites, no peritoneal signs, normal bowel sounds   Assessment and plan: 70 y.o. female with Chronic constipation  This has been an issue of hers for many years. She will continue to take Linzess once daily and she will change her daily MiraLAX dose so that she is taking one half dose once daily since a full dose seems to be causing her to have fairly loose stools. She will call to report on her response in  3-4 weeks.   Owens Loffler, MD [facility] 04/03/2016, 9:48 AM

## 2016-04-06 DIAGNOSIS — E039 Hypothyroidism, unspecified: Secondary | ICD-10-CM | POA: Diagnosis not present

## 2016-04-06 DIAGNOSIS — E7800 Pure hypercholesterolemia, unspecified: Secondary | ICD-10-CM | POA: Diagnosis not present

## 2016-04-06 DIAGNOSIS — E1165 Type 2 diabetes mellitus with hyperglycemia: Secondary | ICD-10-CM | POA: Diagnosis not present

## 2016-04-06 DIAGNOSIS — D443 Neoplasm of uncertain behavior of pituitary gland: Secondary | ICD-10-CM | POA: Diagnosis not present

## 2016-04-08 ENCOUNTER — Other Ambulatory Visit: Payer: Self-pay | Admitting: Physician Assistant

## 2016-04-08 ENCOUNTER — Other Ambulatory Visit: Payer: Self-pay | Admitting: Gastroenterology

## 2016-04-08 ENCOUNTER — Other Ambulatory Visit: Payer: Self-pay | Admitting: Family Medicine

## 2016-04-12 DIAGNOSIS — E039 Hypothyroidism, unspecified: Secondary | ICD-10-CM | POA: Diagnosis not present

## 2016-04-12 DIAGNOSIS — E1165 Type 2 diabetes mellitus with hyperglycemia: Secondary | ICD-10-CM | POA: Diagnosis not present

## 2016-04-12 DIAGNOSIS — E7800 Pure hypercholesterolemia, unspecified: Secondary | ICD-10-CM | POA: Diagnosis not present

## 2016-04-12 DIAGNOSIS — D443 Neoplasm of uncertain behavior of pituitary gland: Secondary | ICD-10-CM | POA: Diagnosis not present

## 2016-04-15 ENCOUNTER — Other Ambulatory Visit: Payer: Self-pay | Admitting: Family Medicine

## 2016-04-22 ENCOUNTER — Other Ambulatory Visit: Payer: Self-pay | Admitting: Family Medicine

## 2016-04-22 DIAGNOSIS — H9190 Unspecified hearing loss, unspecified ear: Secondary | ICD-10-CM | POA: Diagnosis not present

## 2016-04-22 DIAGNOSIS — M545 Low back pain: Secondary | ICD-10-CM | POA: Diagnosis not present

## 2016-04-22 DIAGNOSIS — E785 Hyperlipidemia, unspecified: Secondary | ICD-10-CM | POA: Diagnosis not present

## 2016-04-22 DIAGNOSIS — I10 Essential (primary) hypertension: Secondary | ICD-10-CM | POA: Diagnosis not present

## 2016-04-24 ENCOUNTER — Telehealth: Payer: Self-pay | Admitting: *Deleted

## 2016-04-24 ENCOUNTER — Other Ambulatory Visit: Payer: Self-pay | Admitting: Family Medicine

## 2016-04-24 DIAGNOSIS — R5381 Other malaise: Secondary | ICD-10-CM

## 2016-04-24 NOTE — Telephone Encounter (Signed)
Pt states she is waiting for our office to refill her Gabapentin.  I reviewed pt's medications and office visits.  Pt's Gabapentin was not ordered by this office was ordered by NP Carlean Purl and pt had not been seen in our office since 01/2016.  I informed pt she would need to contact NP [facility] for the refill.  Pt states understanding.

## 2016-04-27 ENCOUNTER — Encounter: Payer: Self-pay | Admitting: Podiatry

## 2016-04-27 ENCOUNTER — Ambulatory Visit (INDEPENDENT_AMBULATORY_CARE_PROVIDER_SITE_OTHER): Payer: Medicare Other | Admitting: Podiatry

## 2016-04-27 DIAGNOSIS — M79673 Pain in unspecified foot: Secondary | ICD-10-CM | POA: Diagnosis not present

## 2016-04-27 DIAGNOSIS — E1140 Type 2 diabetes mellitus with diabetic neuropathy, unspecified: Secondary | ICD-10-CM

## 2016-04-27 DIAGNOSIS — B351 Tinea unguium: Secondary | ICD-10-CM

## 2016-04-27 NOTE — Progress Notes (Signed)
Patient ID: Mandy Webb, female   DOB: 08-22-1946, 70 y.o.   MRN: [id number] Complaint:  Visit Type: Patient returns to my office for continued preventative foot care services. Complaint: Patient states" my nails have grown long and thick and become painful to walk and wear shoes" .Patient is diabetic with neuropathy. The patient presents for preventative foot care services. No changes to ROS  Podiatric Exam: Vascular: dorsalis pedis and posterior tibial pulses are palpable bilateral. Capillary return is immediate. Temperature gradient is WNL. Skin turgor WNL  Sensorium: Normal Semmes Weinstein monofilament test. Normal tactile sensation bilaterally. Nail Exam: Pt has thick disfigured discolored nails with subungual debris noted bilateral entire nail hallux through fifth toenails Ulcer Exam: There is no evidence of ulcer or pre-ulcerative changes or infection. Orthopedic Exam: Muscle tone and strength are WNL. No limitations in general ROM. No crepitus or effusions noted. Foot type and digits show no abnormalities. Bony prominences are unremarkable. Skin: No Porokeratosis. No infection or ulcers  Diagnosis:  Onychomycosis, , Pain in right toe, pain in left toes  Treatment & Plan Procedures and Treatment: Consent by patient was obtained for treatment procedures. The patient understood the discussion of treatment and procedures well. All questions were answered thoroughly reviewed. Debridement of mycotic and hypertrophic toenails, 1 through 5 bilateral and clearing of subungual debris. No ulceration, no infection noted. There is brownish macule left heel with no inflammation.  Possible insect bite.  No inflammation. Return Visit-Office Procedure: Patient instructed to return to the office for a follow up visit 3 months for continued evaluation and treatment.   Gardiner Barefoot DPM

## 2016-04-28 NOTE — Telephone Encounter (Signed)
Close  

## 2016-05-03 DIAGNOSIS — M545 Low back pain: Secondary | ICD-10-CM | POA: Diagnosis not present

## 2016-05-05 ENCOUNTER — Telehealth: Payer: Self-pay | Admitting: Gastroenterology

## 2016-05-05 DIAGNOSIS — M545 Low back pain: Secondary | ICD-10-CM | POA: Diagnosis not present

## 2016-05-05 NOTE — Telephone Encounter (Signed)
Pt has been advised to take miralax daily and follow up as needed.

## 2016-05-08 DIAGNOSIS — M545 Low back pain: Secondary | ICD-10-CM | POA: Diagnosis not present

## 2016-05-09 ENCOUNTER — Other Ambulatory Visit: Payer: Self-pay | Admitting: Family Medicine

## 2016-05-10 ENCOUNTER — Telehealth: Payer: Self-pay | Admitting: Gastroenterology

## 2016-05-10 DIAGNOSIS — E785 Hyperlipidemia, unspecified: Secondary | ICD-10-CM | POA: Diagnosis not present

## 2016-05-10 DIAGNOSIS — I10 Essential (primary) hypertension: Secondary | ICD-10-CM | POA: Diagnosis not present

## 2016-05-10 DIAGNOSIS — Z0000 Encounter for general adult medical examination without abnormal findings: Secondary | ICD-10-CM | POA: Diagnosis not present

## 2016-05-11 NOTE — Telephone Encounter (Signed)
Left message on machine to call back  

## 2016-05-12 DIAGNOSIS — M545 Low back pain: Secondary | ICD-10-CM | POA: Diagnosis not present

## 2016-05-12 NOTE — Telephone Encounter (Signed)
Unable to reach pt will await further communication from the pt  

## 2016-05-12 NOTE — Telephone Encounter (Signed)
Left message on machine to call back  

## 2016-05-17 DIAGNOSIS — M545 Low back pain: Secondary | ICD-10-CM | POA: Diagnosis not present

## 2016-05-19 DIAGNOSIS — M545 Low back pain: Secondary | ICD-10-CM | POA: Diagnosis not present

## 2016-06-05 ENCOUNTER — Other Ambulatory Visit: Payer: Medicare Other

## 2016-06-06 DIAGNOSIS — M545 Low back pain: Secondary | ICD-10-CM | POA: Diagnosis not present

## 2016-06-08 DIAGNOSIS — M545 Low back pain: Secondary | ICD-10-CM | POA: Diagnosis not present

## 2016-06-09 ENCOUNTER — Other Ambulatory Visit: Payer: Self-pay | Admitting: Family Medicine

## 2016-06-09 ENCOUNTER — Other Ambulatory Visit: Payer: Self-pay | Admitting: Gastroenterology

## 2016-06-09 ENCOUNTER — Other Ambulatory Visit: Payer: Self-pay | Admitting: Physician Assistant

## 2016-06-12 ENCOUNTER — Other Ambulatory Visit: Payer: Medicare Other

## 2016-06-14 DIAGNOSIS — M545 Low back pain: Secondary | ICD-10-CM | POA: Diagnosis not present

## 2016-06-15 DIAGNOSIS — E0840 Diabetes mellitus due to underlying condition with diabetic neuropathy, unspecified: Secondary | ICD-10-CM | POA: Diagnosis not present

## 2016-06-15 DIAGNOSIS — I10 Essential (primary) hypertension: Secondary | ICD-10-CM | POA: Diagnosis not present

## 2016-06-15 DIAGNOSIS — M545 Low back pain: Secondary | ICD-10-CM | POA: Diagnosis not present

## 2016-06-15 DIAGNOSIS — H9190 Unspecified hearing loss, unspecified ear: Secondary | ICD-10-CM | POA: Diagnosis not present

## 2016-06-16 ENCOUNTER — Other Ambulatory Visit: Payer: Self-pay | Admitting: Family Medicine

## 2016-06-16 DIAGNOSIS — M545 Low back pain: Secondary | ICD-10-CM | POA: Diagnosis not present

## 2016-06-16 DIAGNOSIS — E2839 Other primary ovarian failure: Secondary | ICD-10-CM

## 2016-06-17 ENCOUNTER — Other Ambulatory Visit: Payer: Self-pay | Admitting: Family Medicine

## 2016-06-19 DIAGNOSIS — M545 Low back pain: Secondary | ICD-10-CM | POA: Diagnosis not present

## 2016-06-20 ENCOUNTER — Other Ambulatory Visit: Payer: Self-pay | Admitting: Family Medicine

## 2016-06-21 DIAGNOSIS — M545 Low back pain: Secondary | ICD-10-CM | POA: Diagnosis not present

## 2016-06-21 NOTE — Telephone Encounter (Signed)
Pt is overdue for f/up. Called and she stated that she is going to another provider now. I advised pt to make sure that she updates her pharm, and I will decline RF w/note.

## 2016-06-23 ENCOUNTER — Other Ambulatory Visit: Payer: Self-pay | Admitting: Family Medicine

## 2016-06-26 DIAGNOSIS — M545 Low back pain: Secondary | ICD-10-CM | POA: Diagnosis not present

## 2016-06-28 DIAGNOSIS — M545 Low back pain: Secondary | ICD-10-CM | POA: Diagnosis not present

## 2016-06-29 ENCOUNTER — Telehealth: Payer: Self-pay | Admitting: Gastroenterology

## 2016-06-29 MED ORDER — POLYETHYLENE GLYCOL 3350 17 GM/SCOOP PO POWD: ORAL | Status: AC

## 2016-06-29 NOTE — Telephone Encounter (Signed)
Prescription sent for the pt as requested

## 2016-07-06 ENCOUNTER — Ambulatory Visit
Admission: RE | Admit: 2016-07-06 | Discharge: 2016-07-06 | Disposition: A | Discharge status: Home / Self Care | Payer: Medicare Other | Source: Ambulatory Visit | Attending: Family Medicine | Admitting: Family Medicine

## 2016-07-06 DIAGNOSIS — E2839 Other primary ovarian failure: Secondary | ICD-10-CM

## 2016-07-06 DIAGNOSIS — Z780 Asymptomatic menopausal state: Secondary | ICD-10-CM | POA: Diagnosis not present

## 2016-07-06 DIAGNOSIS — M8589 Other specified disorders of bone density and structure, multiple sites: Secondary | ICD-10-CM | POA: Diagnosis not present

## 2016-07-07 ENCOUNTER — Other Ambulatory Visit: Payer: Self-pay | Admitting: Family Medicine

## 2016-07-20 ENCOUNTER — Ambulatory Visit (INDEPENDENT_AMBULATORY_CARE_PROVIDER_SITE_OTHER): Payer: Medicare Other | Admitting: Podiatry

## 2016-07-20 ENCOUNTER — Encounter: Payer: Self-pay | Admitting: Podiatry

## 2016-07-20 DIAGNOSIS — M79676 Pain in unspecified toe(s): Secondary | ICD-10-CM

## 2016-07-20 DIAGNOSIS — B351 Tinea unguium: Secondary | ICD-10-CM | POA: Diagnosis not present

## 2016-07-20 DIAGNOSIS — E1140 Type 2 diabetes mellitus with diabetic neuropathy, unspecified: Secondary | ICD-10-CM

## 2016-07-20 NOTE — Progress Notes (Signed)
Patient ID: Mandy Webb, female   DOB: 28-Dec-1945, 70 y.o.   MRN: [id number] Complaint:  Visit Type: Patient returns to my office for continued preventative foot care services. Complaint: Patient states" my nails have grown long and thick and become painful to walk and wear shoes" .Patient is diabetic with neuropathy. The patient presents for preventative foot care services. No changes to ROS  Podiatric Exam: Vascular: dorsalis pedis and posterior tibial pulses are palpable bilateral. Capillary return is immediate. Temperature gradient is WNL. Skin turgor WNL  Sensorium: Normal Semmes Weinstein monofilament test. Normal tactile sensation bilaterally. Nail Exam: Pt has thick disfigured discolored nails with subungual debris noted bilateral entire nail hallux through fifth toenails Ulcer Exam: There is no evidence of ulcer or pre-ulcerative changes or infection. Orthopedic Exam: Muscle tone and strength are WNL. No limitations in general ROM. No crepitus or effusions noted. Foot type and digits show no abnormalities. Bony prominences are unremarkable. Skin: No Porokeratosis. No infection or ulcers  Diagnosis:  Onychomycosis, , Pain in right toe, pain in left toes  Treatment & Plan Procedures and Treatment: Consent by patient was obtained for treatment procedures. The patient understood the discussion of treatment and procedures well. All questions were answered thoroughly reviewed. Debridement of mycotic and hypertrophic toenails, 1 through 5 bilateral and clearing of subungual debris. No ulceration, no infection noted. There is brownish macule left heel with no inflammation.  Possible insect bite.  No inflammation. Return Visit-Office Procedure: Patient instructed to return to the office for a follow up visit 3 months for continued evaluation and treatment.   Gardiner Barefoot DPM

## 2016-08-14 DIAGNOSIS — H2513 Age-related nuclear cataract, bilateral: Secondary | ICD-10-CM | POA: Diagnosis not present

## 2016-08-14 DIAGNOSIS — H401132 Primary open-angle glaucoma, bilateral, moderate stage: Secondary | ICD-10-CM | POA: Diagnosis not present

## 2016-09-13 DIAGNOSIS — I10 Essential (primary) hypertension: Secondary | ICD-10-CM | POA: Diagnosis not present

## 2016-09-13 DIAGNOSIS — K219 Gastro-esophageal reflux disease without esophagitis: Secondary | ICD-10-CM | POA: Diagnosis not present

## 2016-09-13 DIAGNOSIS — J4520 Mild intermittent asthma, uncomplicated: Secondary | ICD-10-CM | POA: Diagnosis not present

## 2016-09-13 DIAGNOSIS — Z23 Encounter for immunization: Secondary | ICD-10-CM | POA: Diagnosis not present

## 2016-09-13 DIAGNOSIS — M545 Low back pain: Secondary | ICD-10-CM | POA: Diagnosis not present

## 2016-09-13 DIAGNOSIS — E039 Hypothyroidism, unspecified: Secondary | ICD-10-CM | POA: Diagnosis not present

## 2016-09-13 DIAGNOSIS — E0840 Diabetes mellitus due to underlying condition with diabetic neuropathy, unspecified: Secondary | ICD-10-CM | POA: Diagnosis not present

## 2016-10-02 DIAGNOSIS — E1165 Type 2 diabetes mellitus with hyperglycemia: Secondary | ICD-10-CM | POA: Diagnosis not present

## 2016-10-02 DIAGNOSIS — E7800 Pure hypercholesterolemia, unspecified: Secondary | ICD-10-CM | POA: Diagnosis not present

## 2016-10-02 DIAGNOSIS — D443 Neoplasm of uncertain behavior of pituitary gland: Secondary | ICD-10-CM | POA: Diagnosis not present

## 2016-10-02 DIAGNOSIS — E039 Hypothyroidism, unspecified: Secondary | ICD-10-CM | POA: Diagnosis not present

## 2016-10-02 DIAGNOSIS — E538 Deficiency of other specified B group vitamins: Secondary | ICD-10-CM | POA: Diagnosis not present

## 2016-10-08 ENCOUNTER — Other Ambulatory Visit: Payer: Self-pay | Admitting: Family Medicine

## 2016-10-08 ENCOUNTER — Other Ambulatory Visit: Payer: Self-pay | Admitting: Gastroenterology

## 2016-10-09 DIAGNOSIS — E039 Hypothyroidism, unspecified: Secondary | ICD-10-CM | POA: Diagnosis not present

## 2016-10-09 DIAGNOSIS — E7800 Pure hypercholesterolemia, unspecified: Secondary | ICD-10-CM | POA: Diagnosis not present

## 2016-10-09 DIAGNOSIS — E1165 Type 2 diabetes mellitus with hyperglycemia: Secondary | ICD-10-CM | POA: Diagnosis not present

## 2016-10-09 DIAGNOSIS — D443 Neoplasm of uncertain behavior of pituitary gland: Secondary | ICD-10-CM | POA: Diagnosis not present

## 2016-10-18 ENCOUNTER — Encounter: Payer: Self-pay | Admitting: Podiatry

## 2016-10-18 ENCOUNTER — Ambulatory Visit (INDEPENDENT_AMBULATORY_CARE_PROVIDER_SITE_OTHER): Payer: Medicare Other | Admitting: Podiatry

## 2016-10-18 VITALS — Resp 14 | Ht 59.5 in | Wt 144.0 lb

## 2016-10-18 DIAGNOSIS — E1140 Type 2 diabetes mellitus with diabetic neuropathy, unspecified: Secondary | ICD-10-CM | POA: Diagnosis not present

## 2016-10-18 DIAGNOSIS — M79676 Pain in unspecified toe(s): Secondary | ICD-10-CM

## 2016-10-18 DIAGNOSIS — B351 Tinea unguium: Secondary | ICD-10-CM

## 2016-10-18 NOTE — Progress Notes (Signed)
Patient ID: Mandy Webb, female   DOB: [birth year], 70 y.o.   MRN: [id number] Complaint:  Visit Type: Patient returns to my office for continued preventative foot care services. Complaint: Patient states" my nails have grown long and thick and become painful to walk and wear shoes" .Patient is diabetic with neuropathy. The patient presents for preventative foot care services. No changes to ROS  Podiatric Exam: Vascular: dorsalis pedis and posterior tibial pulses are palpable bilateral. Capillary return is immediate. Temperature gradient is WNL. Skin turgor WNL  Sensorium: Normal Semmes Weinstein monofilament test. Normal tactile sensation bilaterally. Nail Exam: Pt has thick disfigured discolored nails with subungual debris noted bilateral entire nail hallux through fifth toenails Ulcer Exam: There is no evidence of ulcer or pre-ulcerative changes or infection. Orthopedic Exam: Muscle tone and strength are WNL. No limitations in general ROM. No crepitus or effusions noted. Foot type and digits show no abnormalities. Bony prominences are unremarkable. Skin: No Porokeratosis. No infection or ulcers  Diagnosis:  Onychomycosis, , Pain in right toe, pain in left toes  Treatment & Plan Procedures and Treatment: Consent by patient was obtained for treatment procedures. The patient understood the discussion of treatment and procedures well. All questions were answered thoroughly reviewed. Debridement of mycotic and hypertrophic toenails, 1 through 5 bilateral and clearing of subungual debris. No ulceration, no infection noted. There is brownish macule left heel with no inflammation.  Possible insect bite.  No inflammation. Return Visit-Office Procedure: Patient instructed to return to the office for a follow up visit 3 months for continued evaluation and treatment.   Gardiner Barefoot DPM

## 2016-11-30 DIAGNOSIS — H401132 Primary open-angle glaucoma, bilateral, moderate stage: Secondary | ICD-10-CM | POA: Diagnosis not present

## 2016-11-30 DIAGNOSIS — E119 Type 2 diabetes mellitus without complications: Secondary | ICD-10-CM | POA: Diagnosis not present

## 2016-11-30 DIAGNOSIS — H2513 Age-related nuclear cataract, bilateral: Secondary | ICD-10-CM | POA: Diagnosis not present

## 2016-12-19 DIAGNOSIS — M545 Low back pain: Secondary | ICD-10-CM | POA: Diagnosis not present

## 2016-12-19 DIAGNOSIS — I10 Essential (primary) hypertension: Secondary | ICD-10-CM | POA: Diagnosis not present

## 2016-12-19 DIAGNOSIS — H9190 Unspecified hearing loss, unspecified ear: Secondary | ICD-10-CM | POA: Diagnosis not present

## 2016-12-19 DIAGNOSIS — E1165 Type 2 diabetes mellitus with hyperglycemia: Secondary | ICD-10-CM | POA: Diagnosis not present

## 2017-01-17 ENCOUNTER — Encounter: Payer: Self-pay | Admitting: Podiatry

## 2017-01-17 ENCOUNTER — Ambulatory Visit (INDEPENDENT_AMBULATORY_CARE_PROVIDER_SITE_OTHER): Payer: Medicare Other | Admitting: Podiatry

## 2017-01-17 VITALS — Ht 59.5 in | Wt 144.0 lb

## 2017-01-17 DIAGNOSIS — E1140 Type 2 diabetes mellitus with diabetic neuropathy, unspecified: Secondary | ICD-10-CM | POA: Diagnosis not present

## 2017-01-17 DIAGNOSIS — B351 Tinea unguium: Secondary | ICD-10-CM

## 2017-01-17 DIAGNOSIS — M79676 Pain in unspecified toe(s): Secondary | ICD-10-CM | POA: Diagnosis not present

## 2017-01-17 NOTE — Progress Notes (Signed)
Patient ID: Mandy Webb, female   DOB: [birth year], 71 y.o.   MRN: [id number] Complaint:  Visit Type: Patient returns to my office for continued preventative foot care services. Complaint: Patient states" my nails have grown long and thick and become painful to walk and wear shoes" .Patient is diabetic with neuropathy. The patient presents for preventative foot care services. No changes to ROS  Podiatric Exam: Vascular: dorsalis pedis and posterior tibial pulses are palpable bilateral. Capillary return is immediate. Temperature gradient is WNL. Skin turgor WNL  Sensorium: Normal Semmes Weinstein monofilament test. Normal tactile sensation bilaterally. Nail Exam: Pt has thick disfigured discolored nails with subungual debris noted bilateral entire nail hallux through fifth toenails Ulcer Exam: There is no evidence of ulcer or pre-ulcerative changes or infection. Orthopedic Exam: Muscle tone and strength are WNL. No limitations in general ROM. No crepitus or effusions noted. Foot type and digits show no abnormalities. Bony prominences are unremarkable. Skin: No Porokeratosis. No infection or ulcers  Diagnosis:  Onychomycosis, , Pain in right toe, pain in left toes  Treatment & Plan Procedures and Treatment: Consent by patient was obtained for treatment procedures. The patient understood the discussion of treatment and procedures well. All questions were answered thoroughly reviewed. Debridement of mycotic and hypertrophic toenails, 1 through 5 bilateral and clearing of subungual debris. No ulceration, no infection noted.  Return Visit-Office Procedure: Patient instructed to return to the office for a follow up visit 3 months for continued evaluation and treatment.   Gardiner Barefoot DPM

## 2017-03-27 ENCOUNTER — Other Ambulatory Visit: Payer: Self-pay | Admitting: Family Medicine

## 2017-03-27 DIAGNOSIS — Z1231 Encounter for screening mammogram for malignant neoplasm of breast: Secondary | ICD-10-CM

## 2017-03-30 ENCOUNTER — Ambulatory Visit
Admission: RE | Admit: 2017-03-30 | Discharge: 2017-03-30 | Disposition: A | Discharge status: Home / Self Care | Payer: Medicare Other | Source: Ambulatory Visit | Attending: Family Medicine | Admitting: Family Medicine

## 2017-03-30 DIAGNOSIS — Z1231 Encounter for screening mammogram for malignant neoplasm of breast: Secondary | ICD-10-CM | POA: Diagnosis not present

## 2017-04-03 DIAGNOSIS — E119 Type 2 diabetes mellitus without complications: Secondary | ICD-10-CM | POA: Diagnosis not present

## 2017-04-03 DIAGNOSIS — H2513 Age-related nuclear cataract, bilateral: Secondary | ICD-10-CM | POA: Diagnosis not present

## 2017-04-03 DIAGNOSIS — H401132 Primary open-angle glaucoma, bilateral, moderate stage: Secondary | ICD-10-CM | POA: Diagnosis not present

## 2017-04-05 DIAGNOSIS — Z0000 Encounter for general adult medical examination without abnormal findings: Secondary | ICD-10-CM | POA: Diagnosis not present

## 2017-04-05 DIAGNOSIS — I10 Essential (primary) hypertension: Secondary | ICD-10-CM | POA: Diagnosis not present

## 2017-04-05 DIAGNOSIS — E785 Hyperlipidemia, unspecified: Secondary | ICD-10-CM | POA: Diagnosis not present

## 2017-04-05 DIAGNOSIS — H9190 Unspecified hearing loss, unspecified ear: Secondary | ICD-10-CM | POA: Diagnosis not present

## 2017-04-05 DIAGNOSIS — M545 Low back pain: Secondary | ICD-10-CM | POA: Diagnosis not present

## 2017-04-05 DIAGNOSIS — E1165 Type 2 diabetes mellitus with hyperglycemia: Secondary | ICD-10-CM | POA: Diagnosis not present

## 2017-04-11 ENCOUNTER — Encounter: Payer: Self-pay | Admitting: Podiatry

## 2017-04-11 ENCOUNTER — Ambulatory Visit (INDEPENDENT_AMBULATORY_CARE_PROVIDER_SITE_OTHER): Payer: Medicare Other | Admitting: Podiatry

## 2017-04-11 DIAGNOSIS — B351 Tinea unguium: Secondary | ICD-10-CM

## 2017-04-11 DIAGNOSIS — E1140 Type 2 diabetes mellitus with diabetic neuropathy, unspecified: Secondary | ICD-10-CM

## 2017-04-11 DIAGNOSIS — M79676 Pain in unspecified toe(s): Secondary | ICD-10-CM

## 2017-04-11 NOTE — Progress Notes (Signed)
Patient ID: Mandy Webb, female   DOB: [birth year], 71 y.o.   MRN: [id number] Complaint:  Visit Type: Patient returns to my office for continued preventative foot care services. Complaint: Patient states" my nails have grown long and thick and become painful to walk and wear shoes" .Patient is diabetic with neuropathy. The patient presents for preventative foot care services. No changes to ROS  Podiatric Exam: Vascular: dorsalis pedis and posterior tibial pulses are palpable bilateral. Capillary return is immediate. Temperature gradient is WNL. Skin turgor WNL  Sensorium: Normal Semmes Weinstein monofilament test. Normal tactile sensation bilaterally. Nail Exam: Pt has thick disfigured discolored nails with subungual debris noted bilateral entire nail hallux through fifth toenails Ulcer Exam: There is no evidence of ulcer or pre-ulcerative changes or infection. Orthopedic Exam: Muscle tone and strength are WNL. No limitations in general ROM. No crepitus or effusions noted. Foot type and digits show no abnormalities. Bony prominences are unremarkable. Skin: No Porokeratosis. No infection or ulcers  Diagnosis:  Onychomycosis, , Pain in right toe, pain in left toes  Treatment & Plan Procedures and Treatment: Consent by patient was obtained for treatment procedures. The patient understood the discussion of treatment and procedures well. All questions were answered thoroughly reviewed. Debridement of mycotic and hypertrophic toenails, 1 through 5 bilateral and clearing of subungual debris. No ulceration, no infection noted.  Return Visit-Office Procedure: Patient instructed to return to the office for a follow up visit 3 months for continued evaluation and treatment.   Gardiner Barefoot DPM

## 2017-04-12 DIAGNOSIS — M79605 Pain in left leg: Secondary | ICD-10-CM | POA: Diagnosis not present

## 2017-04-12 DIAGNOSIS — M545 Low back pain: Secondary | ICD-10-CM | POA: Diagnosis not present

## 2017-04-12 DIAGNOSIS — M79604 Pain in right leg: Secondary | ICD-10-CM | POA: Diagnosis not present

## 2017-04-18 DIAGNOSIS — M545 Low back pain: Secondary | ICD-10-CM | POA: Diagnosis not present

## 2017-04-18 DIAGNOSIS — M79605 Pain in left leg: Secondary | ICD-10-CM | POA: Diagnosis not present

## 2017-04-18 DIAGNOSIS — M79604 Pain in right leg: Secondary | ICD-10-CM | POA: Diagnosis not present

## 2017-04-20 DIAGNOSIS — M545 Low back pain: Secondary | ICD-10-CM | POA: Diagnosis not present

## 2017-04-20 DIAGNOSIS — M79604 Pain in right leg: Secondary | ICD-10-CM | POA: Diagnosis not present

## 2017-04-20 DIAGNOSIS — M79605 Pain in left leg: Secondary | ICD-10-CM | POA: Diagnosis not present

## 2017-04-25 DIAGNOSIS — M545 Low back pain: Secondary | ICD-10-CM | POA: Diagnosis not present

## 2017-04-25 DIAGNOSIS — M79604 Pain in right leg: Secondary | ICD-10-CM | POA: Diagnosis not present

## 2017-04-25 DIAGNOSIS — M79605 Pain in left leg: Secondary | ICD-10-CM | POA: Diagnosis not present

## 2017-05-09 DIAGNOSIS — M79604 Pain in right leg: Secondary | ICD-10-CM | POA: Diagnosis not present

## 2017-05-09 DIAGNOSIS — M545 Low back pain: Secondary | ICD-10-CM | POA: Diagnosis not present

## 2017-05-09 DIAGNOSIS — M79605 Pain in left leg: Secondary | ICD-10-CM | POA: Diagnosis not present

## 2017-05-11 DIAGNOSIS — M545 Low back pain: Secondary | ICD-10-CM | POA: Diagnosis not present

## 2017-05-11 DIAGNOSIS — M79604 Pain in right leg: Secondary | ICD-10-CM | POA: Diagnosis not present

## 2017-05-11 DIAGNOSIS — M79605 Pain in left leg: Secondary | ICD-10-CM | POA: Diagnosis not present

## 2017-05-17 DIAGNOSIS — M79605 Pain in left leg: Secondary | ICD-10-CM | POA: Diagnosis not present

## 2017-05-17 DIAGNOSIS — M79604 Pain in right leg: Secondary | ICD-10-CM | POA: Diagnosis not present

## 2017-05-17 DIAGNOSIS — M545 Low back pain: Secondary | ICD-10-CM | POA: Diagnosis not present

## 2017-05-23 DIAGNOSIS — M545 Low back pain: Secondary | ICD-10-CM | POA: Diagnosis not present

## 2017-05-23 DIAGNOSIS — M79604 Pain in right leg: Secondary | ICD-10-CM | POA: Diagnosis not present

## 2017-05-23 DIAGNOSIS — M79605 Pain in left leg: Secondary | ICD-10-CM | POA: Diagnosis not present

## 2017-05-25 DIAGNOSIS — M79605 Pain in left leg: Secondary | ICD-10-CM | POA: Diagnosis not present

## 2017-05-25 DIAGNOSIS — M545 Low back pain: Secondary | ICD-10-CM | POA: Diagnosis not present

## 2017-05-25 DIAGNOSIS — M79604 Pain in right leg: Secondary | ICD-10-CM | POA: Diagnosis not present

## 2017-05-29 DIAGNOSIS — M79604 Pain in right leg: Secondary | ICD-10-CM | POA: Diagnosis not present

## 2017-05-29 DIAGNOSIS — M545 Low back pain: Secondary | ICD-10-CM | POA: Diagnosis not present

## 2017-05-29 DIAGNOSIS — M79605 Pain in left leg: Secondary | ICD-10-CM | POA: Diagnosis not present

## 2017-06-01 DIAGNOSIS — M79604 Pain in right leg: Secondary | ICD-10-CM | POA: Diagnosis not present

## 2017-06-01 DIAGNOSIS — M79605 Pain in left leg: Secondary | ICD-10-CM | POA: Diagnosis not present

## 2017-06-01 DIAGNOSIS — M545 Low back pain: Secondary | ICD-10-CM | POA: Diagnosis not present

## 2017-06-05 DIAGNOSIS — E782 Mixed hyperlipidemia: Secondary | ICD-10-CM | POA: Diagnosis not present

## 2017-06-05 DIAGNOSIS — I10 Essential (primary) hypertension: Secondary | ICD-10-CM | POA: Diagnosis not present

## 2017-06-05 DIAGNOSIS — E039 Hypothyroidism, unspecified: Secondary | ICD-10-CM | POA: Diagnosis not present

## 2017-06-05 DIAGNOSIS — E785 Hyperlipidemia, unspecified: Secondary | ICD-10-CM | POA: Diagnosis not present

## 2017-06-06 ENCOUNTER — Other Ambulatory Visit: Payer: Self-pay | Admitting: Gastroenterology

## 2017-06-08 DIAGNOSIS — M79605 Pain in left leg: Secondary | ICD-10-CM | POA: Diagnosis not present

## 2017-06-08 DIAGNOSIS — M545 Low back pain: Secondary | ICD-10-CM | POA: Diagnosis not present

## 2017-06-08 DIAGNOSIS — M79604 Pain in right leg: Secondary | ICD-10-CM | POA: Diagnosis not present

## 2017-06-12 DIAGNOSIS — M79604 Pain in right leg: Secondary | ICD-10-CM | POA: Diagnosis not present

## 2017-06-12 DIAGNOSIS — M79605 Pain in left leg: Secondary | ICD-10-CM | POA: Diagnosis not present

## 2017-06-12 DIAGNOSIS — M545 Low back pain: Secondary | ICD-10-CM | POA: Diagnosis not present

## 2017-06-26 DIAGNOSIS — M545 Low back pain: Secondary | ICD-10-CM | POA: Diagnosis not present

## 2017-06-26 DIAGNOSIS — M79605 Pain in left leg: Secondary | ICD-10-CM | POA: Diagnosis not present

## 2017-06-26 DIAGNOSIS — M79604 Pain in right leg: Secondary | ICD-10-CM | POA: Diagnosis not present

## 2017-07-11 ENCOUNTER — Encounter: Payer: Self-pay | Admitting: Podiatry

## 2017-07-11 ENCOUNTER — Ambulatory Visit (INDEPENDENT_AMBULATORY_CARE_PROVIDER_SITE_OTHER): Payer: Medicare Other | Admitting: Podiatry

## 2017-07-11 DIAGNOSIS — E1140 Type 2 diabetes mellitus with diabetic neuropathy, unspecified: Secondary | ICD-10-CM | POA: Diagnosis not present

## 2017-07-11 DIAGNOSIS — B351 Tinea unguium: Secondary | ICD-10-CM

## 2017-07-11 DIAGNOSIS — M79676 Pain in unspecified toe(s): Secondary | ICD-10-CM | POA: Diagnosis not present

## 2017-07-11 NOTE — Progress Notes (Signed)
Patient ID: Mandy Webb, female   DOB: 10-07-1946, 71 y.o.   MRN: [id number] Complaint:  Visit Type: Patient returns to my office for continued preventative foot care services. Complaint: Patient states" my nails have grown long and thick and become painful to walk and wear shoes" .Patient is diabetic with neuropathy. The patient presents for preventative foot care services. No changes to ROS  Podiatric Exam: Vascular: dorsalis pedis and posterior tibial pulses are palpable bilateral. Capillary return is immediate. Temperature gradient is WNL. Skin turgor WNL  Sensorium: Normal Semmes Weinstein monofilament test. Normal tactile sensation bilaterally. Nail Exam: Pt has thick disfigured discolored nails with subungual debris noted bilateral entire nail hallux through fifth toenails Ulcer Exam: There is no evidence of ulcer or pre-ulcerative changes or infection. Orthopedic Exam: Muscle tone and strength are WNL. No limitations in general ROM. No crepitus or effusions noted. Foot type and digits show no abnormalities. Bony prominences are unremarkable. Skin: No Porokeratosis. No infection or ulcers  Diagnosis:  Onychomycosis, , Pain in right toe, pain in left toes  Treatment & Plan Procedures and Treatment: Consent by patient was obtained for treatment procedures. The patient understood the discussion of treatment and procedures well. All questions were answered thoroughly reviewed. Debridement of mycotic and hypertrophic toenails, 1 through 5 bilateral and clearing of subungual debris. No ulceration, no infection noted.  Return Visit-Office Procedure: Patient instructed to return to the office for a follow up visit 3 months for continued evaluation and treatment.   Gardiner Barefoot DPM

## 2017-08-07 DIAGNOSIS — E782 Mixed hyperlipidemia: Secondary | ICD-10-CM | POA: Diagnosis not present

## 2017-08-07 DIAGNOSIS — E1165 Type 2 diabetes mellitus with hyperglycemia: Secondary | ICD-10-CM | POA: Diagnosis not present

## 2017-08-07 DIAGNOSIS — I10 Essential (primary) hypertension: Secondary | ICD-10-CM | POA: Diagnosis not present

## 2017-08-07 DIAGNOSIS — E039 Hypothyroidism, unspecified: Secondary | ICD-10-CM | POA: Diagnosis not present

## 2017-08-09 ENCOUNTER — Other Ambulatory Visit: Payer: Self-pay | Admitting: Gastroenterology

## 2017-10-08 DIAGNOSIS — E039 Hypothyroidism, unspecified: Secondary | ICD-10-CM | POA: Diagnosis not present

## 2017-10-08 DIAGNOSIS — E1165 Type 2 diabetes mellitus with hyperglycemia: Secondary | ICD-10-CM | POA: Diagnosis not present

## 2017-10-08 DIAGNOSIS — E7800 Pure hypercholesterolemia, unspecified: Secondary | ICD-10-CM | POA: Diagnosis not present

## 2017-10-08 DIAGNOSIS — D443 Neoplasm of uncertain behavior of pituitary gland: Secondary | ICD-10-CM | POA: Diagnosis not present

## 2017-10-10 ENCOUNTER — Ambulatory Visit: Payer: Medicare Other | Admitting: Podiatry

## 2017-10-10 DIAGNOSIS — D443 Neoplasm of uncertain behavior of pituitary gland: Secondary | ICD-10-CM | POA: Diagnosis not present

## 2017-10-10 DIAGNOSIS — E1165 Type 2 diabetes mellitus with hyperglycemia: Secondary | ICD-10-CM | POA: Diagnosis not present

## 2017-10-10 DIAGNOSIS — I10 Essential (primary) hypertension: Secondary | ICD-10-CM | POA: Diagnosis not present

## 2017-10-10 DIAGNOSIS — E7800 Pure hypercholesterolemia, unspecified: Secondary | ICD-10-CM | POA: Diagnosis not present

## 2017-10-10 DIAGNOSIS — E039 Hypothyroidism, unspecified: Secondary | ICD-10-CM | POA: Diagnosis not present

## 2017-10-17 ENCOUNTER — Encounter: Payer: Self-pay | Admitting: Podiatry

## 2017-10-17 ENCOUNTER — Ambulatory Visit (INDEPENDENT_AMBULATORY_CARE_PROVIDER_SITE_OTHER): Payer: Medicare Other | Admitting: Podiatry

## 2017-10-17 DIAGNOSIS — B351 Tinea unguium: Secondary | ICD-10-CM

## 2017-10-17 DIAGNOSIS — E1140 Type 2 diabetes mellitus with diabetic neuropathy, unspecified: Secondary | ICD-10-CM | POA: Diagnosis not present

## 2017-10-17 MED ORDER — AMMONIUM LACTATE 12 % EX CREA: TOPICAL_CREAM | CUTANEOUS | 0 refills | Status: DC | PRN

## 2017-10-17 NOTE — Progress Notes (Signed)
Patient ID: Mandy Webb, female   DOB: [birth year], 71 y.o.   MRN: [id number] Complaint:  Visit Type: Patient returns to my office for continued preventative foot care services. Complaint: Patient states" my nails have grown long and thick and become painful to walk and wear shoes" .Patient is diabetic with neuropathy. The patient presents for preventative foot care services. No changes to ROS  Podiatric Exam: Vascular: dorsalis pedis and posterior tibial pulses are palpable bilateral. Capillary return is immediate. Temperature gradient is WNL. Skin turgor WNL  Sensorium: Normal Semmes Weinstein monofilament test. Normal tactile sensation bilaterally. Nail Exam: Pt has thick disfigured discolored nails with subungual debris noted bilateral entire nail hallux through fifth toenails Ulcer Exam: There is no evidence of ulcer or pre-ulcerative changes or infection. Orthopedic Exam: Muscle tone and strength are WNL. No limitations in general ROM. No crepitus or effusions noted. Foot type and digits show no abnormalities. Bony prominences are unremarkable. Skin: No Porokeratosis. No infection or ulcers  Diagnosis:  Onychomycosis, , Pain in right toe, pain in left toes  Treatment & Plan Procedures and Treatment: Consent by patient was obtained for treatment procedures. The patient understood the discussion of treatment and procedures well. All questions were answered thoroughly reviewed. Debridement of mycotic and hypertrophic toenails, 1 through 5 bilateral and clearing of subungual debris. No ulceration, no infection noted. Lac hydrin prescribed.  Return Visit-Office Procedure: Patient instructed to return to the office for a follow up visit 3 months for continued evaluation and treatment.   Gardiner Barefoot DPM

## 2017-10-17 NOTE — Addendum Note (Signed)
Addended byDeidre Ala, Leobardo Granlund L on: 10/17/2017 08:20 AM   Modules accepted: Orders

## 2017-10-24 DIAGNOSIS — H47233 Glaucomatous optic atrophy, bilateral: Secondary | ICD-10-CM | POA: Diagnosis not present

## 2017-10-29 ENCOUNTER — Other Ambulatory Visit: Payer: Self-pay | Admitting: Gastroenterology

## 2017-11-21 DIAGNOSIS — E039 Hypothyroidism, unspecified: Secondary | ICD-10-CM | POA: Diagnosis not present

## 2017-11-21 DIAGNOSIS — E089 Diabetes mellitus due to underlying condition without complications: Secondary | ICD-10-CM | POA: Diagnosis not present

## 2017-11-21 DIAGNOSIS — H26239 Glaucomatous flecks (subcapsular), unspecified eye: Secondary | ICD-10-CM | POA: Diagnosis not present

## 2017-11-21 DIAGNOSIS — E1165 Type 2 diabetes mellitus with hyperglycemia: Secondary | ICD-10-CM | POA: Diagnosis not present

## 2017-11-21 DIAGNOSIS — I10 Essential (primary) hypertension: Secondary | ICD-10-CM | POA: Diagnosis not present

## 2017-12-10 ENCOUNTER — Other Ambulatory Visit: Payer: Self-pay | Admitting: Gastroenterology

## 2017-12-19 DIAGNOSIS — Z1231 Encounter for screening mammogram for malignant neoplasm of breast: Secondary | ICD-10-CM | POA: Diagnosis not present

## 2017-12-20 DIAGNOSIS — H47233 Glaucomatous optic atrophy, bilateral: Secondary | ICD-10-CM | POA: Diagnosis not present

## 2018-01-15 DIAGNOSIS — J208 Acute bronchitis due to other specified organisms: Secondary | ICD-10-CM | POA: Diagnosis not present

## 2018-01-16 ENCOUNTER — Encounter: Payer: Self-pay | Admitting: Podiatry

## 2018-01-16 ENCOUNTER — Ambulatory Visit (INDEPENDENT_AMBULATORY_CARE_PROVIDER_SITE_OTHER): Payer: Medicare Other | Admitting: Podiatry

## 2018-01-16 DIAGNOSIS — E1140 Type 2 diabetes mellitus with diabetic neuropathy, unspecified: Secondary | ICD-10-CM

## 2018-01-16 DIAGNOSIS — B351 Tinea unguium: Secondary | ICD-10-CM | POA: Diagnosis not present

## 2018-01-16 NOTE — Progress Notes (Signed)
Patient ID: Mandy Webb, female   DOB: 06/22/1946, 72 y.o.   MRN: [id number] Complaint:  Visit Type: Patient returns to my office for continued preventative foot care services. Complaint: Patient states" my nails have grown long and thick and become painful to walk and wear shoes" .Patient is diabetic with neuropathy. The patient presents for preventative foot care services. No changes to ROS  Podiatric Exam: Vascular: dorsalis pedis and posterior tibial pulses are palpable bilateral. Capillary return is immediate. Temperature gradient is WNL. Skin turgor WNL  Sensorium: Normal Semmes Weinstein monofilament test. Normal tactile sensation bilaterally. Nail Exam: Pt has thick disfigured discolored nails with subungual debris noted bilateral entire nail hallux through fifth toenails Ulcer Exam: There is no evidence of ulcer or pre-ulcerative changes or infection. Orthopedic Exam: Muscle tone and strength are WNL. No limitations in general ROM. No crepitus or effusions noted. Foot type and digits show no abnormalities. Bony prominences are unremarkable. Palpable arch pain. Skin: No Porokeratosis. No infection or ulcers  Diagnosis:  Onychomycosis, , Pain in right toe, pain in left toes  Treatment & Plan Procedures and Treatment: Consent by patient was obtained for treatment procedures. The patient understood the discussion of treatment and procedures well. All questions were answered thoroughly reviewed. Debridement of mycotic and hypertrophic toenails, 1 through 5 bilateral and clearing of subungual debris. No ulceration, no infection noted. Padding put into arch right foot to help with plantar fascia pain.  Return Visit-Office Procedure: Patient instructed to return to the office for a follow up visit 3 months for continued evaluation and treatment.   Gardiner Barefoot DPM

## 2018-01-22 DIAGNOSIS — J208 Acute bronchitis due to other specified organisms: Secondary | ICD-10-CM | POA: Diagnosis not present

## 2018-02-08 DIAGNOSIS — E119 Type 2 diabetes mellitus without complications: Secondary | ICD-10-CM | POA: Diagnosis not present

## 2018-02-08 DIAGNOSIS — I10 Essential (primary) hypertension: Secondary | ICD-10-CM | POA: Diagnosis not present

## 2018-02-08 DIAGNOSIS — E039 Hypothyroidism, unspecified: Secondary | ICD-10-CM | POA: Diagnosis not present

## 2018-02-08 DIAGNOSIS — E785 Hyperlipidemia, unspecified: Secondary | ICD-10-CM | POA: Diagnosis not present

## 2018-02-08 DIAGNOSIS — G589 Mononeuropathy, unspecified: Secondary | ICD-10-CM | POA: Diagnosis not present

## 2018-02-11 ENCOUNTER — Other Ambulatory Visit: Payer: Self-pay | Admitting: Gastroenterology

## 2018-02-17 ENCOUNTER — Other Ambulatory Visit: Payer: Self-pay | Admitting: Gastroenterology

## 2018-02-20 ENCOUNTER — Other Ambulatory Visit: Payer: Self-pay | Admitting: Gastroenterology

## 2018-02-21 ENCOUNTER — Other Ambulatory Visit: Payer: Self-pay | Admitting: Family Medicine

## 2018-02-21 DIAGNOSIS — Z139 Encounter for screening, unspecified: Secondary | ICD-10-CM

## 2018-03-21 DIAGNOSIS — H47233 Glaucomatous optic atrophy, bilateral: Secondary | ICD-10-CM | POA: Diagnosis not present

## 2018-03-22 DIAGNOSIS — E039 Hypothyroidism, unspecified: Secondary | ICD-10-CM | POA: Diagnosis not present

## 2018-03-22 DIAGNOSIS — E1165 Type 2 diabetes mellitus with hyperglycemia: Secondary | ICD-10-CM | POA: Diagnosis not present

## 2018-03-27 DIAGNOSIS — I10 Essential (primary) hypertension: Secondary | ICD-10-CM | POA: Diagnosis not present

## 2018-03-27 DIAGNOSIS — E782 Mixed hyperlipidemia: Secondary | ICD-10-CM | POA: Diagnosis not present

## 2018-03-27 DIAGNOSIS — E0840 Diabetes mellitus due to underlying condition with diabetic neuropathy, unspecified: Secondary | ICD-10-CM | POA: Diagnosis not present

## 2018-03-27 DIAGNOSIS — E1165 Type 2 diabetes mellitus with hyperglycemia: Secondary | ICD-10-CM | POA: Diagnosis not present

## 2018-03-29 DIAGNOSIS — E039 Hypothyroidism, unspecified: Secondary | ICD-10-CM | POA: Diagnosis not present

## 2018-03-29 DIAGNOSIS — E7800 Pure hypercholesterolemia, unspecified: Secondary | ICD-10-CM | POA: Diagnosis not present

## 2018-03-29 DIAGNOSIS — D443 Neoplasm of uncertain behavior of pituitary gland: Secondary | ICD-10-CM | POA: Diagnosis not present

## 2018-03-29 DIAGNOSIS — E1165 Type 2 diabetes mellitus with hyperglycemia: Secondary | ICD-10-CM | POA: Diagnosis not present

## 2018-03-29 DIAGNOSIS — I10 Essential (primary) hypertension: Secondary | ICD-10-CM | POA: Diagnosis not present

## 2018-04-01 ENCOUNTER — Ambulatory Visit: Payer: Medicare Other

## 2018-04-17 ENCOUNTER — Ambulatory Visit (INDEPENDENT_AMBULATORY_CARE_PROVIDER_SITE_OTHER): Payer: Medicare Other | Admitting: Podiatry

## 2018-04-17 ENCOUNTER — Encounter: Payer: Self-pay | Admitting: Podiatry

## 2018-04-17 DIAGNOSIS — B351 Tinea unguium: Secondary | ICD-10-CM

## 2018-04-17 DIAGNOSIS — E1140 Type 2 diabetes mellitus with diabetic neuropathy, unspecified: Secondary | ICD-10-CM | POA: Diagnosis not present

## 2018-04-17 NOTE — Progress Notes (Signed)
Patient ID: Mandy Webb, female   DOB: 12/08/1946, 72 y.o.   MRN: [id number] Complaint:  Visit Type: Patient returns to my office for continued preventative foot care services. Complaint: Patient states" my nails have grown long and thick and become painful to walk and wear shoes" .Patient is diabetic with neuropathy. The patient presents for preventative foot care services. No changes to ROS  Podiatric Exam: Vascular: dorsalis pedis and posterior tibial pulses are palpable bilateral. Capillary return is immediate. Temperature gradient is WNL. Skin turgor WNL  Sensorium: Normal Semmes Weinstein monofilament test. Normal tactile sensation bilaterally. Nail Exam: Pt has thick disfigured discolored nails with subungual debris noted bilateral entire nail hallux through fifth toenails Ulcer Exam: There is no evidence of ulcer or pre-ulcerative changes or infection. Orthopedic Exam: Muscle tone and strength are WNL. No limitations in general ROM. No crepitus or effusions noted. Foot type and digits show no abnormalities. Bony prominences are unremarkable.  Skin: No Porokeratosis. No infection or ulcers  Diagnosis:  Onychomycosis, , Pain in right toe, pain in left toes  Treatment & Plan Procedures and Treatment: Consent by patient was obtained for treatment procedures. The patient understood the discussion of treatment and procedures well. All questions were answered thoroughly reviewed. Debridement of mycotic and hypertrophic toenails, 1 through 5 bilateral and clearing of subungual debris. No ulceration, no infection noted.   Return Visit-Office Procedure: Patient instructed to return to the office for a follow up visit 3 months for continued evaluation and treatment.   Kharson Rasmusson DPM 

## 2018-04-24 DIAGNOSIS — I10 Essential (primary) hypertension: Secondary | ICD-10-CM | POA: Diagnosis not present

## 2018-04-24 DIAGNOSIS — H26239 Glaucomatous flecks (subcapsular), unspecified eye: Secondary | ICD-10-CM | POA: Diagnosis not present

## 2018-04-24 DIAGNOSIS — E089 Diabetes mellitus due to underlying condition without complications: Secondary | ICD-10-CM | POA: Diagnosis not present

## 2018-04-24 DIAGNOSIS — E785 Hyperlipidemia, unspecified: Secondary | ICD-10-CM | POA: Diagnosis not present

## 2018-04-24 DIAGNOSIS — E0840 Diabetes mellitus due to underlying condition with diabetic neuropathy, unspecified: Secondary | ICD-10-CM | POA: Diagnosis not present

## 2018-04-24 DIAGNOSIS — E1165 Type 2 diabetes mellitus with hyperglycemia: Secondary | ICD-10-CM | POA: Diagnosis not present

## 2018-04-24 DIAGNOSIS — J4520 Mild intermittent asthma, uncomplicated: Secondary | ICD-10-CM | POA: Diagnosis not present

## 2018-04-24 DIAGNOSIS — E559 Vitamin D deficiency, unspecified: Secondary | ICD-10-CM | POA: Diagnosis not present

## 2018-05-06 ENCOUNTER — Other Ambulatory Visit: Payer: Self-pay | Admitting: Family Medicine

## 2018-05-06 DIAGNOSIS — Z1231 Encounter for screening mammogram for malignant neoplasm of breast: Secondary | ICD-10-CM

## 2018-05-16 ENCOUNTER — Ambulatory Visit (INDEPENDENT_AMBULATORY_CARE_PROVIDER_SITE_OTHER): Payer: Medicare Other | Admitting: Physician Assistant

## 2018-05-16 ENCOUNTER — Encounter: Payer: Self-pay | Admitting: Physician Assistant

## 2018-05-16 VITALS — BP 126/60 | HR 75 | Ht 60.0 in | Wt 166.0 lb

## 2018-05-16 DIAGNOSIS — K5909 Other constipation: Secondary | ICD-10-CM | POA: Diagnosis not present

## 2018-05-16 MED ORDER — LINACLOTIDE 145 MCG PO CAPS: 145.0000 ug | ORAL_CAPSULE | Freq: Every day | ORAL | 3 refills | Status: DC

## 2018-05-16 NOTE — Progress Notes (Signed)
Chief Complaint:"Stomach feels full all the time"  Review of pertinent gastrointestinal problems:   1. History of colon polyps/colonoscopy 2005 in [place] (26m polyp),Colonoscopy 2010 found no polyps or cancers, positive melanosis throughout colon. ++ Family history for colon cancer. Surveillance colonoscopy 04/2014 Dr. JArdis Hughsfound no polyps, + diverticulosis, recommended recall colonoscopy at 5 years. 2. Questionable history of stomach polyps or Barrett's. [place] esophagogastroduodenoscopy by Dr. JArdis HughsJanuary 2007 showed normal upper gastrointestinal evaluation. No Barrett's, no polyps.   3. Chronic constipation. Well treated with a variety of agents, including an herbal medicine that she takes, aloe vera, Miralax p.r.n.   4. intermitent abd pains: CT 08/2008 essentially negative, felt this was from chronic constipation. Ultrasound abdomen on November 16, 2009, showed diffuse fatty infiltration of the liver, otherwise unremarkable. HIDA scan on December, 2010, showed normal gallbladder ejection fraction, otherwise an unremarkable study.  CT scan 03/2013: mild constipation, otherwise normal. 5. abnormal gastric biopsies, June 2011. Moderate to severe gastritis noted. Biopsies taken. No H. pylori however pathology suggested possible carcinoid reaction. July 2011; August, 2011 repeat EGD performed; pan gastritis, pathology showed gastritis EC hyperplasia, NE features. Octreotide scan normal. 2011, intrinsic factor antibodies negative. EGD 07/2012 ([facility]) 1) Small hiatal hernia 2) Moderate pan gastritis. Biopsies taken 3) Otherwise normal examination. Biopsies showed no IM, only mild chronic gastritis without H. Pylori.  HPI:    Mrs. HCascois a 72year old female, known to Dr. JArdis Hughs past medical history as listed below, who presents clinic today and describes that her "stomah feels full all the time" .    04/03/16 office visit Dr. JArdis Hughswith complaint of  chronic constipation. Continued with problems with constipation. Stopped MiraLAX and was more constipated. Took Linzess 1 pill daily.  At that time, recommend she continue Linzess 145 mcg and take half dose of MiraLAX daily.    Today, the patient is a poor historian, describes that she feels "very full when I eat or drink anything", this has been happening for quite a while. Does express that when she uses MiraLAX and has a good bowel movement then she does not feel this way at all. Has not been using her Linzess at all, as she was unaware this was for her bowel movements. Typically, she will only have a bowel movement once per week. Again when she does have a bowel movement she feels completely better.    Social history positive [facility], has friends. [place].    Denies fever, chills, blood in her stool, melena, weight loss, anorexia, nausea or symptoms that awaken her at night.  Past Medical History:  Diagnosis Date  . Allergy   . Arthritis   . Asthma   . Diabetes mellitus   . Hyperlipidemia   . Hypertension     Past Surgical History:  Procedure Laterality Date  . ABDOMINAL HYSTERECTOMY    . APPENDECTOMY    . PITUITARY SURGERY    . TUBAL LIGATION      Current Outpatient Medications  Medication Sig Dispense Refill  . albuterol (PROVENTIL HFA;VENTOLIN HFA) 108 (90 BASE) MCG/ACT inhaler Inhale 2 puffs into the lungs every 6 (six) hours as needed for wheezing (cough, shortness of breath or wheezing.). 1 Inhaler 5  . ammonium lactate (LAC-HYDRIN) 12 % cream Apply topically as needed for dry skin. 385 g 0  . atorvastatin (LIPITOR) 20  MG tablet TAKE ONE TABLET BY MOUTH ONCE DAILY 30 tablet 0  . Blood Glucose Monitoring Suppl (BLOOD GLUCOSE METER) kit Use as instructed 1 each 0  . Blood Pressure KIT Please check you blood pressure after resting for five minutes.  Call [facility] if you pressure is consistently  higher than 150/90. 1 each 0  . brinzolamide (AZOPT) 1 % ophthalmic suspension Place 1 drop into both eyes 2 (two) times daily.     Marland Kitchen gabapentin (NEURONTIN) 600 MG tablet TAKE ONE TABLET BY MOUTH 4 TIMES DAILY 120 tablet 0  . glucose blood (ONE TOUCH ULTRA TEST) test strip Use to test blood sugar daily. Dx code: E11.9 100 each 2  . indapamide (LOZOL) 1.25 MG tablet     . Lancets (ONETOUCH ULTRASOFT) lancets Test blood sugar daily. Dx code: E11.9 100 each 3  . levothyroxine (SYNTHROID, LEVOTHROID) 75 MCG tablet Take 1 tablet (75 mcg total) by mouth daily. 90 tablet 1  . LINZESS 145 MCG CAPS capsule TAKE ONE CAPSULE BY MOUTH  DAILY 30 capsule 0  . metoCLOPramide (REGLAN) 10 MG tablet TAKE 1 TABLET BY MOUTH ONCE DAILY 90 tablet 0  . montelukast (SINGULAIR) 10 MG tablet TAKE ONE TABLET BY MOUTH ONCE DAILY AT BEDTIME 90 tablet 0  . omeprazole (PRILOSEC) 20 MG capsule TAKE ONE CAPSULE BY MOUTH ONCE DAILY 30 capsule 3  . polyethylene glycol powder (GLYCOLAX/MIRALAX) powder TAKE 17 G BY MOUTH DAILY 765 g 3  . predniSONE (DELTASONE) 5 MG tablet     . ramipril (ALTACE) 5 MG capsule TAKE ONE CAPSULE BY MOUTH ONCE DAILY 30 capsule 0  . triamcinolone (KENALOG) 0.025 % cream Apply 1 application topically 2 (two) times daily as needed (for skin). 30 g 3   No current facility-administered medications for this visit.     Allergies as of 05/16/2018 - Review Complete 05/16/2018  Allergen Reaction Noted  . Protonix [pantoprazole sodium]  05/21/2013    Family History  Problem Relation Age of Onset  . Stomach cancer Mother   . Cancer Mother        stomach cancer  . Hypertension Father   . Heart disease Brother     Social History   Socioeconomic History  . Marital status: Single    Spouse name: Not on file  . Number of children: 3  . Years of education: Not on file  . Highest education level: Not on file  Occupational History  . Occupation: Retired    Fish farm manager: UNEMPLOYED  Social Needs  .  Financial resource strain: Not on file  . Food insecurity:    Worry: Not on file    Inability: Not on file  . Transportation needs:    Medical: Not on file    Non-medical: Not on file  Tobacco Use  . Smoking status: Former Smoker    Last attempt to quit: 02/02/1969    Years since quitting: 49.3  . Smokeless tobacco: Never Used  Substance and Sexual Activity  . Alcohol use: No    Alcohol/week: 0.0 oz  . Drug use: No  . Sexual activity: Never  Lifestyle  . Physical activity:    Days per week: Not on file    Minutes per session: Not on file  . Stress: Not on file  Relationships  . Social connections:    Talks on phone: Not on file    Gets together: Not on file    Attends religious service: Not on file    Active member  of club or organization: Not on file    Attends meetings of clubs or organizations: Not on file    Relationship status: Not on file  . Intimate partner violence:    Fear of current or ex partner: Not on file    Emotionally abused: Not on file    Physically abused: Not on file    Forced sexual activity: Not on file  Other Topics Concern  . Not on file  Social History Narrative   Lives alone;   1 daughter lives in [place]   1 son lives in [place]    Review of Systems:    Constitutional: No weight loss, fever or chills Cardiovascular: No chest pain Respiratory: No SOB  Gastrointestinal: See HPI and otherwise negative   Physical Exam:  Vital signs: BP 126/60   Pulse 75   Ht 5' (1.524 m)   Wt 166 lb (75.3 kg)   BMI 32.42 kg/m    Constitutional:   Pleasant elderly female appears to be in NAD, Well developed, Well nourished, alert and cooperative Respiratory: Respirations even and unlabored. Lungs clear to auscultation bilaterally.   No wheezes, crackles, or rhonchi.  Cardiovascular: Normal S1, S2. No MRG. Regular rate and rhythm. No peripheral edema, cyanosis or pallor.  Gastrointestinal:  Soft, nondistended, nontender. No rebound or guarding.  Normal bowel sounds. No appreciable masses or hepatomegaly. Psychiatric: Demonstrates good judgement and reason. Memory impairment  No recent labs or imaging.  Assessment: 1. Chronic constipation: Patient has not been using her Linzess and describes a "full feeling", when she does have a good bowel movement this symptom is gone  Plan: 1. Discussed with the patient the reason that she feels full is because she is not having regular bowel movements. 2. Educated the patient about Linzess and the reason why she is taking this medication as well as MiraLAX. 3. Recommend the patient use Linzess 145 mcg daily. We discussed trying a decreased dose but she would like to stay on this dose for now. Provided her with a prescription #90 with 3 refills. 4. Explained that the patient can continue MiraLAX as needed if she still does not feel empty or is not having good bowel movements. Explained that at her last office visit she was told to use half a dose a day. She verbalized understanding. 5. Patient to follow in clinic as needed with Dr. Ardis Hughs or myself in the future.  Ellouise Newer, PA-C [facility] 05/16/2018, 10:07 AM  Cc: Lucianne Lei, MD

## 2018-05-16 NOTE — Patient Instructions (Signed)
If you are age 74 or older, your body mass index should be between 23-30. Your Body mass index is 32.42 kg/m. If this is out of the aforementioned range listed, please consider follow up with your Primary Care Provider.   We have sent the following medications to your pharmacy for you to pick up at your convenience: Linzess 145 mcg  Please use EVERY DAY to help with the full feeling. We have provided you with samples.   Use Miralax, 17 grams in 8 oz of water as needed if you are not having a regular bowel movments.

## 2018-05-16 NOTE — Progress Notes (Signed)
I agree with the above note, plan 

## 2018-05-28 ENCOUNTER — Ambulatory Visit
Admission: RE | Admit: 2018-05-28 | Discharge: 2018-05-28 | Disposition: A | Discharge status: Home / Self Care | Payer: Medicare Other | Source: Ambulatory Visit | Attending: Family Medicine | Admitting: Family Medicine

## 2018-05-28 DIAGNOSIS — Z1231 Encounter for screening mammogram for malignant neoplasm of breast: Secondary | ICD-10-CM | POA: Diagnosis not present

## 2018-05-31 DIAGNOSIS — I10 Essential (primary) hypertension: Secondary | ICD-10-CM | POA: Diagnosis not present

## 2018-05-31 DIAGNOSIS — M545 Low back pain: Secondary | ICD-10-CM | POA: Diagnosis not present

## 2018-05-31 DIAGNOSIS — Z0000 Encounter for general adult medical examination without abnormal findings: Secondary | ICD-10-CM | POA: Diagnosis not present

## 2018-05-31 DIAGNOSIS — E039 Hypothyroidism, unspecified: Secondary | ICD-10-CM | POA: Diagnosis not present

## 2018-05-31 DIAGNOSIS — E119 Type 2 diabetes mellitus without complications: Secondary | ICD-10-CM | POA: Diagnosis not present

## 2018-07-04 ENCOUNTER — Other Ambulatory Visit: Payer: Self-pay | Admitting: Gastroenterology

## 2018-07-15 ENCOUNTER — Other Ambulatory Visit: Payer: Self-pay | Admitting: Gastroenterology

## 2018-07-16 DIAGNOSIS — E785 Hyperlipidemia, unspecified: Secondary | ICD-10-CM | POA: Diagnosis not present

## 2018-07-16 DIAGNOSIS — E119 Type 2 diabetes mellitus without complications: Secondary | ICD-10-CM | POA: Diagnosis not present

## 2018-07-16 DIAGNOSIS — E039 Hypothyroidism, unspecified: Secondary | ICD-10-CM | POA: Diagnosis not present

## 2018-07-16 DIAGNOSIS — I10 Essential (primary) hypertension: Secondary | ICD-10-CM | POA: Diagnosis not present

## 2018-07-17 ENCOUNTER — Ambulatory Visit: Payer: Medicare Other | Admitting: Podiatry

## 2018-07-23 ENCOUNTER — Encounter: Payer: Self-pay | Admitting: Podiatry

## 2018-07-23 ENCOUNTER — Ambulatory Visit (INDEPENDENT_AMBULATORY_CARE_PROVIDER_SITE_OTHER): Payer: Medicare Other | Admitting: Podiatry

## 2018-07-23 DIAGNOSIS — M79674 Pain in right toe(s): Secondary | ICD-10-CM

## 2018-07-23 DIAGNOSIS — E1140 Type 2 diabetes mellitus with diabetic neuropathy, unspecified: Secondary | ICD-10-CM

## 2018-07-23 DIAGNOSIS — B351 Tinea unguium: Secondary | ICD-10-CM

## 2018-07-23 DIAGNOSIS — M79675 Pain in left toe(s): Secondary | ICD-10-CM | POA: Diagnosis not present

## 2018-07-23 NOTE — Progress Notes (Signed)
Patient ID: Mandy Webb, female   DOB: 01/22/1946, 72 y.o.   MRN: [id number] Complaint:  Visit Type: Patient returns to my office for continued preventative foot care services. Complaint: Patient states" my nails have grown long and thick and become painful to walk and wear shoes" .Patient is diabetic with neuropathy. The patient presents for preventative foot care services. No changes to ROS  Podiatric Exam: Vascular: dorsalis pedis and posterior tibial pulses are palpable bilateral. Capillary return is immediate. Temperature gradient is WNL. Skin turgor WNL  Sensorium: Normal Semmes Weinstein monofilament test. Normal tactile sensation bilaterally. Nail Exam: Pt has thick disfigured discolored nails with subungual debris noted bilateral entire nail hallux through fifth toenails Ulcer Exam: There is no evidence of ulcer or pre-ulcerative changes or infection. Orthopedic Exam: Muscle tone and strength are WNL. No limitations in general ROM. No crepitus or effusions noted. Foot type and digits show no abnormalities. Bony prominences are unremarkable.  Skin: No Porokeratosis. No infection or ulcers  Diagnosis:  Onychomycosis, , Pain in right toe, pain in left toes  Treatment & Plan Procedures and Treatment: Consent by patient was obtained for treatment procedures. The patient understood the discussion of treatment and procedures well. All questions were answered thoroughly reviewed. Debridement of mycotic and hypertrophic toenails, 1 through 5 bilateral and clearing of subungual debris. No ulceration, no infection noted.   Return Visit-Office Procedure: Patient instructed to return to the office for a follow up visit 3 months for continued evaluation and treatment.   Mandy Webb DPM 

## 2018-07-25 DIAGNOSIS — E782 Mixed hyperlipidemia: Secondary | ICD-10-CM | POA: Diagnosis not present

## 2018-07-25 DIAGNOSIS — E119 Type 2 diabetes mellitus without complications: Secondary | ICD-10-CM | POA: Diagnosis not present

## 2018-07-25 DIAGNOSIS — I10 Essential (primary) hypertension: Secondary | ICD-10-CM | POA: Diagnosis not present

## 2018-07-26 ENCOUNTER — Other Ambulatory Visit: Payer: Self-pay | Admitting: Family Medicine

## 2018-07-26 DIAGNOSIS — Z1231 Encounter for screening mammogram for malignant neoplasm of breast: Secondary | ICD-10-CM

## 2018-08-02 ENCOUNTER — Other Ambulatory Visit: Payer: Self-pay | Admitting: Gastroenterology

## 2018-08-02 ENCOUNTER — Other Ambulatory Visit: Payer: Self-pay

## 2018-08-02 NOTE — Patient Outreach (Signed)
[facility]) Care Management  08/02/2018  Mandy Webb 1946/01/16 [id number]   Medication Adherence call to Mandy Webb patient is due on Metformin 500 mg she ask if we can call doctor Criss Rosales and ask for a 90 days supply on her medications including Metformin, [facility] said patient does not have refill, call doctor Criss Rosales left a message for them to send in a prescription on all her medication for a 90 days supply. Mandy Webb is showing past due under [place] [employer].   [facility] Direct Dial [phone]  Fax [phone] [email or web address]

## 2018-08-21 DIAGNOSIS — H47233 Glaucomatous optic atrophy, bilateral: Secondary | ICD-10-CM | POA: Diagnosis not present

## 2018-09-10 DIAGNOSIS — I10 Essential (primary) hypertension: Secondary | ICD-10-CM | POA: Diagnosis not present

## 2018-09-10 DIAGNOSIS — H9190 Unspecified hearing loss, unspecified ear: Secondary | ICD-10-CM | POA: Diagnosis not present

## 2018-09-10 DIAGNOSIS — E7800 Pure hypercholesterolemia, unspecified: Secondary | ICD-10-CM | POA: Diagnosis not present

## 2018-09-10 DIAGNOSIS — E119 Type 2 diabetes mellitus without complications: Secondary | ICD-10-CM | POA: Diagnosis not present

## 2018-10-08 DIAGNOSIS — J4520 Mild intermittent asthma, uncomplicated: Secondary | ICD-10-CM | POA: Diagnosis not present

## 2018-10-11 DIAGNOSIS — I10 Essential (primary) hypertension: Secondary | ICD-10-CM | POA: Diagnosis not present

## 2018-10-11 DIAGNOSIS — D443 Neoplasm of uncertain behavior of pituitary gland: Secondary | ICD-10-CM | POA: Diagnosis not present

## 2018-10-11 DIAGNOSIS — E1165 Type 2 diabetes mellitus with hyperglycemia: Secondary | ICD-10-CM | POA: Diagnosis not present

## 2018-10-11 DIAGNOSIS — E039 Hypothyroidism, unspecified: Secondary | ICD-10-CM | POA: Diagnosis not present

## 2018-10-11 DIAGNOSIS — E7800 Pure hypercholesterolemia, unspecified: Secondary | ICD-10-CM | POA: Diagnosis not present

## 2018-10-15 ENCOUNTER — Other Ambulatory Visit: Payer: Self-pay | Admitting: Gastroenterology

## 2018-10-23 ENCOUNTER — Ambulatory Visit (INDEPENDENT_AMBULATORY_CARE_PROVIDER_SITE_OTHER): Payer: Medicare Other | Admitting: Podiatry

## 2018-10-23 ENCOUNTER — Encounter: Payer: Self-pay | Admitting: Podiatry

## 2018-10-23 DIAGNOSIS — E1140 Type 2 diabetes mellitus with diabetic neuropathy, unspecified: Secondary | ICD-10-CM

## 2018-10-23 DIAGNOSIS — M79674 Pain in right toe(s): Secondary | ICD-10-CM | POA: Diagnosis not present

## 2018-10-23 DIAGNOSIS — B351 Tinea unguium: Secondary | ICD-10-CM | POA: Diagnosis not present

## 2018-10-23 DIAGNOSIS — M79675 Pain in left toe(s): Secondary | ICD-10-CM | POA: Diagnosis not present

## 2018-10-23 NOTE — Progress Notes (Signed)
Patient ID: Mandy Webb, female   DOB: 06/28/1946, 72 y.o.   MRN: [id number] Complaint:  Visit Type: Patient returns to my office for continued preventative foot care services. Complaint: Patient states" my nails have grown long and thick and become painful to walk and wear shoes" .Patient is diabetic with neuropathy. The patient presents for preventative foot care services. No changes to ROS  Podiatric Exam: Vascular: dorsalis pedis and posterior tibial pulses are palpable bilateral. Capillary return is immediate. Temperature gradient is WNL. Skin turgor WNL  Sensorium: Normal Semmes Weinstein monofilament test. Normal tactile sensation bilaterally. Nail Exam: Pt has thick disfigured discolored nails with subungual debris noted bilateral entire nail hallux through fifth toenails Ulcer Exam: There is no evidence of ulcer or pre-ulcerative changes or infection. Orthopedic Exam: Muscle tone and strength are WNL. No limitations in general ROM. No crepitus or effusions noted. Foot type and digits show no abnormalities. Bony prominences are unremarkable.  Skin: No Porokeratosis. No infection or ulcers  Diagnosis:  Onychomycosis, , Pain in right toe, pain in left toes  Treatment & Plan Procedures and Treatment: Consent by patient was obtained for treatment procedures. The patient understood the discussion of treatment and procedures well. All questions were answered thoroughly reviewed. Debridement of mycotic and hypertrophic toenails, 1 through 5 bilateral and clearing of subungual debris. No ulceration, no infection noted.   Return Visit-Office Procedure: Patient instructed to return to the office for a follow up visit 3 months for continued evaluation and treatment.   Mattox Schorr DPM 

## 2018-12-25 DIAGNOSIS — E785 Hyperlipidemia, unspecified: Secondary | ICD-10-CM | POA: Diagnosis not present

## 2018-12-25 DIAGNOSIS — E039 Hypothyroidism, unspecified: Secondary | ICD-10-CM | POA: Diagnosis not present

## 2018-12-25 DIAGNOSIS — E782 Mixed hyperlipidemia: Secondary | ICD-10-CM | POA: Diagnosis not present

## 2018-12-25 DIAGNOSIS — E119 Type 2 diabetes mellitus without complications: Secondary | ICD-10-CM | POA: Diagnosis not present

## 2018-12-31 DIAGNOSIS — Z1231 Encounter for screening mammogram for malignant neoplasm of breast: Secondary | ICD-10-CM | POA: Diagnosis not present

## 2019-01-12 ENCOUNTER — Other Ambulatory Visit: Payer: Self-pay | Admitting: Gastroenterology

## 2019-01-18 ENCOUNTER — Other Ambulatory Visit: Payer: Self-pay | Admitting: Gastroenterology

## 2019-01-21 ENCOUNTER — Other Ambulatory Visit: Payer: Self-pay | Admitting: Gastroenterology

## 2019-01-22 ENCOUNTER — Ambulatory Visit: Payer: Medicare Other | Admitting: Podiatry

## 2019-01-30 DIAGNOSIS — Z0289 Encounter for other administrative examinations: Secondary | ICD-10-CM | POA: Diagnosis not present

## 2019-02-19 ENCOUNTER — Encounter: Payer: Self-pay | Admitting: Podiatry

## 2019-02-19 ENCOUNTER — Ambulatory Visit (INDEPENDENT_AMBULATORY_CARE_PROVIDER_SITE_OTHER): Payer: Medicare Other | Admitting: Podiatry

## 2019-02-19 DIAGNOSIS — B351 Tinea unguium: Secondary | ICD-10-CM

## 2019-02-19 DIAGNOSIS — E1140 Type 2 diabetes mellitus with diabetic neuropathy, unspecified: Secondary | ICD-10-CM

## 2019-02-19 DIAGNOSIS — M79675 Pain in left toe(s): Secondary | ICD-10-CM | POA: Diagnosis not present

## 2019-02-19 DIAGNOSIS — M79674 Pain in right toe(s): Secondary | ICD-10-CM

## 2019-02-19 NOTE — Progress Notes (Signed)
Patient ID: Mandy Webb, female   DOB: 09/16/1946, 73 y.o.   MRN: [id number] Complaint:  Visit Type: Patient returns to my office for continued preventative foot care services. Complaint: Patient states" my nails have grown long and thick and become painful to walk and wear shoes" .Patient is diabetic with neuropathy. The patient presents for preventative foot care services. No changes to ROS  Podiatric Exam: Vascular: dorsalis pedis and posterior tibial pulses are palpable bilateral. Capillary return is immediate. Temperature gradient is WNL. Skin turgor WNL  Sensorium: Normal Semmes Weinstein monofilament test. Normal tactile sensation bilaterally. Nail Exam: Pt has thick disfigured discolored nails with subungual debris noted bilateral entire nail hallux through fifth toenails Ulcer Exam: There is no evidence of ulcer or pre-ulcerative changes or infection. Orthopedic Exam: Muscle tone and strength are WNL. No limitations in general ROM. No crepitus or effusions noted. Foot type and digits show no abnormalities. Bony prominences are unremarkable.  Skin: No Porokeratosis. No infection or ulcers  Diagnosis:  Onychomycosis, , Pain in right toe, pain in left toes  Treatment & Plan Procedures and Treatment: Consent by patient was obtained for treatment procedures. The patient understood the discussion of treatment and procedures well. All questions were answered thoroughly reviewed. Debridement of mycotic and hypertrophic toenails, 1 through 5 bilateral and clearing of subungual debris. No ulceration, no infection noted.   Return Visit-Office Procedure: Patient instructed to return to the office for a follow up visit 3 months for continued evaluation and treatment.   Gardiner Barefoot DPM

## 2019-04-25 DIAGNOSIS — L853 Xerosis cutis: Secondary | ICD-10-CM | POA: Diagnosis not present

## 2019-04-25 DIAGNOSIS — I10 Essential (primary) hypertension: Secondary | ICD-10-CM | POA: Diagnosis not present

## 2019-04-25 DIAGNOSIS — E039 Hypothyroidism, unspecified: Secondary | ICD-10-CM | POA: Diagnosis not present

## 2019-04-25 DIAGNOSIS — E1169 Type 2 diabetes mellitus with other specified complication: Secondary | ICD-10-CM | POA: Diagnosis not present

## 2019-04-25 DIAGNOSIS — E785 Hyperlipidemia, unspecified: Secondary | ICD-10-CM | POA: Diagnosis not present

## 2019-05-01 ENCOUNTER — Encounter: Payer: Self-pay | Admitting: Gastroenterology

## 2019-05-08 ENCOUNTER — Other Ambulatory Visit: Payer: Self-pay

## 2019-05-14 ENCOUNTER — Ambulatory Visit: Payer: Medicare Other | Admitting: *Deleted

## 2019-05-14 ENCOUNTER — Other Ambulatory Visit: Payer: Self-pay

## 2019-05-14 VITALS — Ht 61.0 in | Wt 160.0 lb

## 2019-05-14 DIAGNOSIS — Z800 Family history of malignant neoplasm of digestive organs: Secondary | ICD-10-CM

## 2019-05-14 MED ORDER — PEG 3350-KCL-NA BICARB-NACL 420 G PO SOLR: 4000.0000 mL | Freq: Once | ORAL | 0 refills | Status: AC

## 2019-05-14 NOTE — Progress Notes (Signed)
No egg or soy allergy known to patient  No issues with past sedation with any surgeries  or procedures, no intubation problems  No diet pills per patient No home 02 use per patient  No blood thinners per patient  Pt denies issues with constipation  No A fib or A flutter  Pt verified name, DOB, address and insurance during PV today. Pt mailed instruction packet to included paper to complete and mail back to [facility] with addressed and stamped envelope, Emmi video, copy of consent form to read and not return, and instructions.  PV completed over the phone. Pt encouraged to call with questions or issues

## 2019-05-21 ENCOUNTER — Telehealth: Payer: Self-pay | Admitting: *Deleted

## 2019-05-21 NOTE — Telephone Encounter (Signed)

## 2019-05-23 ENCOUNTER — Telehealth: Payer: Self-pay | Admitting: Gastroenterology

## 2019-05-23 ENCOUNTER — Encounter: Payer: Self-pay | Admitting: Gastroenterology

## 2019-05-23 ENCOUNTER — Other Ambulatory Visit: Payer: Self-pay

## 2019-05-23 ENCOUNTER — Ambulatory Visit (AMBULATORY_SURGERY_CENTER): Payer: Medicare Other | Admitting: Gastroenterology

## 2019-05-23 ENCOUNTER — Ambulatory Visit: Payer: Medicare Other | Admitting: Podiatry

## 2019-05-23 VITALS — BP 147/70 | HR 69 | Temp 98.4°F | Resp 15 | Ht 61.0 in | Wt 160.0 lb

## 2019-05-23 DIAGNOSIS — Z86010 Personal history of colonic polyps: Secondary | ICD-10-CM | POA: Diagnosis not present

## 2019-05-23 DIAGNOSIS — Z800 Family history of malignant neoplasm of digestive organs: Secondary | ICD-10-CM

## 2019-05-23 DIAGNOSIS — K5730 Diverticulosis of large intestine without perforation or abscess without bleeding: Secondary | ICD-10-CM

## 2019-05-23 MED ORDER — SODIUM CHLORIDE 0.9 % IV SOLN: 500.0000 mL | Freq: Once | INTRAVENOUS | Status: DC

## 2019-05-23 NOTE — Progress Notes (Signed)
Kinmundy, Edgard, LPN- Vitals

## 2019-05-23 NOTE — Telephone Encounter (Signed)
Patient had colonoscopy this am. Her husband called this afternoon. She is doing well, but forget to speak with Dr. Ardis Hughs regarding her problem with long-term constipation. She says it has been a problem for "years" off and on. At it's worst, she may go up to 5-7 days between bowel movements. When asked, she states she drinks plenty of water. I suggested a High Fiber diet and gave her education on what that is. She would like what medications Dr. Ardis Hughs would recommend as well (Benefiber? Metamucil?). Please advise.  Thank you

## 2019-05-23 NOTE — Telephone Encounter (Signed)
Pt had a colonoscopy this morning and reported that she is constipated.  Please advise.

## 2019-05-23 NOTE — Progress Notes (Signed)
PT taken to PACU. Monitors in place. VSS. Report given to RN. 

## 2019-05-23 NOTE — Op Note (Signed)
[facility] Patient Name: Mandy Webb Procedure Date: 05/23/2019 9:42 AM MRN: [id number] Endoscopist: Milus Banister , MD Age: 73 Referring MD:  Date of Birth: 04/01/1946 Gender: Female Account #: [id number] Procedure:                Colonoscopy Indications:              High risk colon cancer surveillance: FH of colon                            cancer (mother) Personal history of colonic polyps;                            History of colon polyps/colonoscopy 2005 in [place]                            (5mm polyp),Colonoscopy 2010 found no polyps or                            cancers, positive melanosis throughout colon. ++                            Family history for colon cancer. Surveillance                            colonoscopy 04/2014 Dr. Ardis Hughs found no polyps, +                            diverticulosis, recommended recall colonoscopy at 3                            years. Today she tells me that her mother did not                            have colon cancer, she had stomach cancer. Medicines:                Monitored Anesthesia Care Procedure:                Pre-Anesthesia Assessment:                           - Prior to the procedure, a History and Physical                            was performed, and patient medications and                            allergies were reviewed. The patient's tolerance of                            previous anesthesia was also reviewed. The risks                            and benefits of the procedure and the sedation  options and risks were discussed with the patient.                            All questions were answered, and informed consent                            was obtained. Prior Anticoagulants: The patient has                            taken no previous anticoagulant or antiplatelet                            agents. ASA Grade Assessment: III - A patient with                            severe  systemic disease. After reviewing the risks                            and benefits, the patient was deemed in                            satisfactory condition to undergo the procedure.                           After obtaining informed consent, the colonoscope                            was passed under direct vision. Throughout the                            procedure, the patient's blood pressure, pulse, and                            oxygen saturations were monitored continuously. The                            Colonoscope was introduced through the anus and                            advanced to the the cecum, identified by                            appendiceal orifice and ileocecal valve. The                            colonoscopy was performed without difficulty. The                            patient tolerated the procedure well. The quality                            of the bowel preparation was adequate. The  ileocecal valve, appendiceal orifice, and rectum                            were photographed. Scope In: 9:44:52 AM Scope Out: 9:55:35 AM Scope Withdrawal Time: 0 hours 7 minutes 49 seconds  Total Procedure Duration: 0 hours 10 minutes 43 seconds  Findings:                 Multiple small and large-mouthed diverticula were                            found in the left colon.                           The exam was otherwise without abnormality on                            direct and retroflexion views. Complications:            No immediate complications. Estimated blood loss:                            None. Estimated Blood Loss:     Estimated blood loss: none. Impression:               - Diverticulosis in the left colon.                           - The examination was otherwise normal on direct                            and retroflexion views.                           - No polyps or cancers. Recommendation:           - Patient has a contact  number available for                            emergencies. The signs and symptoms of potential                            delayed complications were discussed with the                            patient. Return to normal activities tomorrow.                            Written discharge instructions were provided to the                            patient.                           - Resume previous diet.                           - Continue present medications.  You do not need any further colon cancer screening                            tests (including stool testing). These types of                            tests generally stop around age 14-80. Milus Banister, MD 05/23/2019 9:59:48 AM This report has been signed electronically.

## 2019-05-23 NOTE — Patient Instructions (Signed)
Discharge instructions given. Handout on Diverticulosis. Resume previous medications. YOU HAD AN ENDOSCOPIC PROCEDURE TODAY AT THE [facility]:   Refer to the procedure report that was given to you for any specific questions about what was found during the examination.  If the procedure report does not answer your questions, please call your gastroenterologist to clarify.  If you requested that your care partner not be given the details of your procedure findings, then the procedure report has been included in a sealed envelope for you to review at your convenience later.  YOU SHOULD EXPECT: Some feelings of bloating in the abdomen. Passage of more gas than usual.  Walking can help get rid of the air that was put into your GI tract during the procedure and reduce the bloating. If you had a lower endoscopy (such as a colonoscopy or flexible sigmoidoscopy) you may notice spotting of blood in your stool or on the toilet paper. If you underwent a bowel prep for your procedure, you may not have a normal bowel movement for a few days.  Please Note:  You might notice some irritation and congestion in your nose or some drainage.  This is from the oxygen used during your procedure.  There is no need for concern and it should clear up in a day or so.  SYMPTOMS TO REPORT IMMEDIATELY:   Following lower endoscopy (colonoscopy or flexible sigmoidoscopy):  Excessive amounts of blood in the stool  Significant tenderness or worsening of abdominal pains  Swelling of the abdomen that is new, acute  Fever of 100F or higher   For urgent or emergent issues, a gastroenterologist can be reached at any hour by calling [phone].   DIET:  We do recommend a small meal at first, but then you may proceed to your regular diet.  Drink plenty of fluids but you should avoid alcoholic beverages for 24 hours.  ACTIVITY:  You should plan to take it easy for the rest of today and you should NOT DRIVE or use  heavy machinery until tomorrow (because of the sedation medicines used during the test).    FOLLOW UP: Our staff will call the number listed on your records 48-72 hours following your procedure to check on you and address any questions or concerns that you may have regarding the information given to you following your procedure. If we do not reach you, we will leave a message.  We will attempt to reach you two times.  During this call, we will ask if you have developed any symptoms of COVID 19. If you develop any symptoms (ie: fever, flu-like symptoms, shortness of breath, cough etc.) before then, please call [phone].  If you test positive for Covid 19 in the 2 weeks post procedure, please call and report this information to us.    If any biopsies were taken you will be contacted by phone or by letter within the next 1-3 weeks.  Please call us at [phone] if you have not heard about the biopsies in 3 weeks.    SIGNATURES/CONFIDENTIALITY: You and/or your care partner have signed paperwork which will be entered into your electronic medical record.  These signatures attest to the fact that that the information above on your After Visit Summary has been reviewed and is understood.  Full responsibility of the confidentiality of this discharge information lies with you and/or your care-partner. 

## 2019-05-23 NOTE — Telephone Encounter (Signed)
I called the pt and she states she is speaking with nurse in the [facility] regarding her symptoms.

## 2019-05-25 NOTE — Telephone Encounter (Signed)
I have been seeing her for many years, have discussed her constipation many times.    Claiborne Billings, Please offer her a telemed or in person visit to discuss her chronic constipation.  Thanks

## 2019-05-26 DIAGNOSIS — H905 Unspecified sensorineural hearing loss: Secondary | ICD-10-CM | POA: Diagnosis not present

## 2019-05-26 NOTE — Telephone Encounter (Signed)
I spoke to patient this morning to discus her constipation and offer a telemed or in person visit. Patient states she started taking OTC Miralax  And feels her constipation problems have resolved. She declined a visit. She will call for any questions or concerns.

## 2019-05-27 ENCOUNTER — Telehealth: Payer: Self-pay

## 2019-05-27 NOTE — Telephone Encounter (Signed)
  Follow up Call-  Call back number 05/23/2019  Post procedure Call Back phone  # [phone]  Permission to leave phone message Yes  Some recent data might be hidden     Patient questions:  Do you have a fever, pain , or abdominal swelling? No. Pain Score  0 *  Have you tolerated food without any problems? Yes.    Have you been able to return to your normal activities? Yes.    Do you have any questions about your discharge instructions: Diet   No. Medications  No. Follow up visit  No.  Do you have questions or concerns about your Care? No.  Actions: * If pain score is 4 or above: No action needed, pain <4. 1. Have you developed a fever since your procedure? no  2.   Have you had an respiratory symptoms (SOB or cough) since your procedure? no  3.   Have you tested positive for COVID 19 since your procedure no  4.   Have you had any family members/close contacts diagnosed with the COVID 19 since your procedure?  no   If yes to any of these questions please route to Joylene John, RN and Alphonsa Gin, Therapist, sports.

## 2019-05-27 NOTE — Telephone Encounter (Signed)
See other phone note

## 2019-05-27 NOTE — Telephone Encounter (Signed)
Left message on follow up call. 

## 2019-05-30 ENCOUNTER — Other Ambulatory Visit: Payer: Self-pay

## 2019-05-30 ENCOUNTER — Ambulatory Visit
Admission: RE | Admit: 2019-05-30 | Discharge: 2019-05-30 | Disposition: A | Discharge status: Home / Self Care | Payer: Medicare Other | Source: Ambulatory Visit | Attending: Family Medicine | Admitting: Family Medicine

## 2019-05-30 DIAGNOSIS — Z1231 Encounter for screening mammogram for malignant neoplasm of breast: Secondary | ICD-10-CM | POA: Diagnosis not present

## 2019-06-18 ENCOUNTER — Ambulatory Visit: Payer: Medicare Other | Admitting: Podiatry

## 2019-06-23 ENCOUNTER — Telehealth: Payer: Self-pay | Admitting: *Deleted

## 2019-06-23 NOTE — Telephone Encounter (Signed)
Pt called for next appt. I informed 07/11/2019 at 10:45am. Pt asked for the address. I informed pt of [address], [place].

## 2019-06-25 ENCOUNTER — Other Ambulatory Visit: Payer: Self-pay | Admitting: Gastroenterology

## 2019-07-11 ENCOUNTER — Other Ambulatory Visit: Payer: Self-pay

## 2019-07-11 ENCOUNTER — Encounter: Payer: Self-pay | Admitting: Podiatry

## 2019-07-11 ENCOUNTER — Ambulatory Visit (INDEPENDENT_AMBULATORY_CARE_PROVIDER_SITE_OTHER): Payer: Medicare Other | Admitting: Podiatry

## 2019-07-11 VITALS — Temp 97.7°F

## 2019-07-11 DIAGNOSIS — B351 Tinea unguium: Secondary | ICD-10-CM

## 2019-07-11 DIAGNOSIS — E1140 Type 2 diabetes mellitus with diabetic neuropathy, unspecified: Secondary | ICD-10-CM

## 2019-07-11 DIAGNOSIS — M79675 Pain in left toe(s): Secondary | ICD-10-CM | POA: Diagnosis not present

## 2019-07-11 DIAGNOSIS — M79674 Pain in right toe(s): Secondary | ICD-10-CM

## 2019-07-11 NOTE — Progress Notes (Signed)
Patient ID: Mandy Webb, female   DOB: 04-25-1946, 73 y.o.   MRN: [id number] Complaint:  Visit Type: Patient returns to my office for continued preventative foot care services. Complaint: Patient states" my nails have grown long and thick and become painful to walk and wear shoes" .Patient is diabetic with neuropathy. The patient presents for preventative foot care services. No changes to ROS  Podiatric Exam: Vascular: dorsalis pedis and posterior tibial pulses are palpable bilateral. Capillary return is immediate. Temperature gradient is WNL. Skin turgor WNL  Sensorium: Normal Semmes Weinstein monofilament test. Normal tactile sensation bilaterally. Nail Exam: Pt has thick disfigured discolored nails with subungual debris noted bilateral entire nail hallux through fifth toenails Ulcer Exam: There is no evidence of ulcer or pre-ulcerative changes or infection. Orthopedic Exam: Muscle tone and strength are WNL. No limitations in general ROM. No crepitus or effusions noted. Foot type and digits show no abnormalities. Bony prominences are unremarkable.  Skin: No Porokeratosis. No infection or ulcers  Diagnosis:  Onychomycosis, , Pain in right toe, pain in left toes  Treatment & Plan Procedures and Treatment: Consent by patient was obtained for treatment procedures. The patient understood the discussion of treatment and procedures well. All questions were answered thoroughly reviewed. Debridement of mycotic and hypertrophic toenails, 1 through 5 bilateral and clearing of subungual debris. No ulceration, no infection noted.   Return Visit-Office Procedure: Patient instructed to return to the office for a follow up visit 3 months for continued evaluation and treatment.   Gardiner Barefoot DPM

## 2019-07-30 DIAGNOSIS — Z0000 Encounter for general adult medical examination without abnormal findings: Secondary | ICD-10-CM | POA: Diagnosis not present

## 2019-07-30 DIAGNOSIS — I10 Essential (primary) hypertension: Secondary | ICD-10-CM | POA: Diagnosis not present

## 2019-07-30 DIAGNOSIS — I509 Heart failure, unspecified: Secondary | ICD-10-CM | POA: Diagnosis not present

## 2019-07-30 DIAGNOSIS — E119 Type 2 diabetes mellitus without complications: Secondary | ICD-10-CM | POA: Diagnosis not present

## 2019-07-30 DIAGNOSIS — E1169 Type 2 diabetes mellitus with other specified complication: Secondary | ICD-10-CM | POA: Diagnosis not present

## 2019-07-30 DIAGNOSIS — E782 Mixed hyperlipidemia: Secondary | ICD-10-CM | POA: Diagnosis not present

## 2019-07-30 DIAGNOSIS — E039 Hypothyroidism, unspecified: Secondary | ICD-10-CM | POA: Diagnosis not present

## 2019-08-13 ENCOUNTER — Other Ambulatory Visit: Payer: Self-pay | Admitting: Gastroenterology

## 2019-10-06 DIAGNOSIS — W19XXXA Unspecified fall, initial encounter: Secondary | ICD-10-CM | POA: Diagnosis not present

## 2019-10-06 DIAGNOSIS — I10 Essential (primary) hypertension: Secondary | ICD-10-CM | POA: Diagnosis not present

## 2019-10-08 DIAGNOSIS — H47233 Glaucomatous optic atrophy, bilateral: Secondary | ICD-10-CM | POA: Diagnosis not present

## 2019-10-17 ENCOUNTER — Ambulatory Visit (INDEPENDENT_AMBULATORY_CARE_PROVIDER_SITE_OTHER): Payer: Medicare Other | Admitting: Podiatry

## 2019-10-17 ENCOUNTER — Other Ambulatory Visit: Payer: Self-pay

## 2019-10-17 ENCOUNTER — Encounter: Payer: Self-pay | Admitting: Podiatry

## 2019-10-17 DIAGNOSIS — B351 Tinea unguium: Secondary | ICD-10-CM

## 2019-10-17 DIAGNOSIS — M79675 Pain in left toe(s): Secondary | ICD-10-CM

## 2019-10-17 DIAGNOSIS — M79674 Pain in right toe(s): Secondary | ICD-10-CM | POA: Diagnosis not present

## 2019-10-17 DIAGNOSIS — E1140 Type 2 diabetes mellitus with diabetic neuropathy, unspecified: Secondary | ICD-10-CM

## 2019-10-17 NOTE — Progress Notes (Signed)
Patient ID: Mandy Webb, female   DOB: 06/11/1946, 73 y.o.   MRN: [id number] Complaint:  Visit Type: Patient returns to my office for continued preventative foot care services. Complaint: Patient states" my nails have grown long and thick and become painful to walk and wear shoes" .Patient is diabetic with neuropathy. The patient presents for preventative foot care services. No changes to ROS  Podiatric Exam: Vascular: dorsalis pedis and posterior tibial pulses are palpable bilateral. Capillary return is immediate. Temperature gradient is WNL. Skin turgor WNL  Sensorium: Normal Semmes Weinstein monofilament test. Normal tactile sensation bilaterally. Nail Exam: Pt has thick disfigured discolored nails with subungual debris noted bilateral entire nail hallux through fifth toenails Ulcer Exam: There is no evidence of ulcer or pre-ulcerative changes or infection. Orthopedic Exam: Muscle tone and strength are WNL. No limitations in general ROM. No crepitus or effusions noted. Foot type and digits show no abnormalities. Bony prominences are unremarkable.  Skin: No Porokeratosis. No infection or ulcers  Diagnosis:  Onychomycosis, , Pain in right toe, pain in left toes  Treatment & Plan Procedures and Treatment: Consent by patient was obtained for treatment procedures. The patient understood the discussion of treatment and procedures well. All questions were answered thoroughly reviewed. Debridement of mycotic and hypertrophic toenails, 1 through 5 bilateral and clearing of subungual debris. No ulceration, no infection noted.   Return Visit-Office Procedure: Patient instructed to return to the office for a follow up visit 3 months for continued evaluation and treatment.   Jahden Schara DPM 

## 2019-10-23 ENCOUNTER — Other Ambulatory Visit: Payer: Self-pay | Admitting: Gastroenterology

## 2019-10-23 DIAGNOSIS — D352 Benign neoplasm of pituitary gland: Secondary | ICD-10-CM | POA: Diagnosis not present

## 2019-10-23 DIAGNOSIS — I119 Hypertensive heart disease without heart failure: Secondary | ICD-10-CM | POA: Diagnosis not present

## 2019-10-23 DIAGNOSIS — D375 Neoplasm of uncertain behavior of rectum: Secondary | ICD-10-CM | POA: Diagnosis not present

## 2019-10-23 DIAGNOSIS — Z9181 History of falling: Secondary | ICD-10-CM | POA: Diagnosis not present

## 2019-10-23 DIAGNOSIS — Z859 Personal history of malignant neoplasm, unspecified: Secondary | ICD-10-CM | POA: Diagnosis not present

## 2019-10-23 DIAGNOSIS — Z7952 Long term (current) use of systemic steroids: Secondary | ICD-10-CM | POA: Diagnosis not present

## 2019-10-23 DIAGNOSIS — E039 Hypothyroidism, unspecified: Secondary | ICD-10-CM | POA: Diagnosis not present

## 2019-10-23 DIAGNOSIS — Z86010 Personal history of colonic polyps: Secondary | ICD-10-CM | POA: Diagnosis not present

## 2019-10-23 DIAGNOSIS — G2581 Restless legs syndrome: Secondary | ICD-10-CM | POA: Diagnosis not present

## 2019-10-23 DIAGNOSIS — E1142 Type 2 diabetes mellitus with diabetic polyneuropathy: Secondary | ICD-10-CM | POA: Diagnosis not present

## 2019-10-23 DIAGNOSIS — I6529 Occlusion and stenosis of unspecified carotid artery: Secondary | ICD-10-CM | POA: Diagnosis not present

## 2019-10-23 DIAGNOSIS — D371 Neoplasm of uncertain behavior of stomach: Secondary | ICD-10-CM | POA: Diagnosis not present

## 2019-10-23 DIAGNOSIS — K219 Gastro-esophageal reflux disease without esophagitis: Secondary | ICD-10-CM | POA: Diagnosis not present

## 2019-10-23 DIAGNOSIS — K5900 Constipation, unspecified: Secondary | ICD-10-CM | POA: Diagnosis not present

## 2019-10-23 DIAGNOSIS — J45909 Unspecified asthma, uncomplicated: Secondary | ICD-10-CM | POA: Diagnosis not present

## 2019-10-23 DIAGNOSIS — M5136 Other intervertebral disc degeneration, lumbar region: Secondary | ICD-10-CM | POA: Diagnosis not present

## 2019-10-28 DIAGNOSIS — E1142 Type 2 diabetes mellitus with diabetic polyneuropathy: Secondary | ICD-10-CM | POA: Diagnosis not present

## 2019-10-28 DIAGNOSIS — M5136 Other intervertebral disc degeneration, lumbar region: Secondary | ICD-10-CM | POA: Diagnosis not present

## 2019-10-28 DIAGNOSIS — J45909 Unspecified asthma, uncomplicated: Secondary | ICD-10-CM | POA: Diagnosis not present

## 2019-10-28 DIAGNOSIS — D375 Neoplasm of uncertain behavior of rectum: Secondary | ICD-10-CM | POA: Diagnosis not present

## 2019-10-28 DIAGNOSIS — Z86010 Personal history of colonic polyps: Secondary | ICD-10-CM | POA: Diagnosis not present

## 2019-10-28 DIAGNOSIS — G2581 Restless legs syndrome: Secondary | ICD-10-CM | POA: Diagnosis not present

## 2019-10-28 DIAGNOSIS — E039 Hypothyroidism, unspecified: Secondary | ICD-10-CM | POA: Diagnosis not present

## 2019-10-28 DIAGNOSIS — K219 Gastro-esophageal reflux disease without esophagitis: Secondary | ICD-10-CM | POA: Diagnosis not present

## 2019-10-28 DIAGNOSIS — K5900 Constipation, unspecified: Secondary | ICD-10-CM | POA: Diagnosis not present

## 2019-10-28 DIAGNOSIS — Z9181 History of falling: Secondary | ICD-10-CM | POA: Diagnosis not present

## 2019-10-28 DIAGNOSIS — I6529 Occlusion and stenosis of unspecified carotid artery: Secondary | ICD-10-CM | POA: Diagnosis not present

## 2019-10-28 DIAGNOSIS — Z7952 Long term (current) use of systemic steroids: Secondary | ICD-10-CM | POA: Diagnosis not present

## 2019-10-28 DIAGNOSIS — I119 Hypertensive heart disease without heart failure: Secondary | ICD-10-CM | POA: Diagnosis not present

## 2019-10-28 DIAGNOSIS — D352 Benign neoplasm of pituitary gland: Secondary | ICD-10-CM | POA: Diagnosis not present

## 2019-10-28 DIAGNOSIS — Z859 Personal history of malignant neoplasm, unspecified: Secondary | ICD-10-CM | POA: Diagnosis not present

## 2019-10-28 DIAGNOSIS — D371 Neoplasm of uncertain behavior of stomach: Secondary | ICD-10-CM | POA: Diagnosis not present

## 2019-10-30 DIAGNOSIS — D352 Benign neoplasm of pituitary gland: Secondary | ICD-10-CM | POA: Diagnosis not present

## 2019-10-30 DIAGNOSIS — E039 Hypothyroidism, unspecified: Secondary | ICD-10-CM | POA: Diagnosis not present

## 2019-10-30 DIAGNOSIS — Z7952 Long term (current) use of systemic steroids: Secondary | ICD-10-CM | POA: Diagnosis not present

## 2019-10-30 DIAGNOSIS — Z9181 History of falling: Secondary | ICD-10-CM | POA: Diagnosis not present

## 2019-10-30 DIAGNOSIS — Z86010 Personal history of colonic polyps: Secondary | ICD-10-CM | POA: Diagnosis not present

## 2019-10-30 DIAGNOSIS — K219 Gastro-esophageal reflux disease without esophagitis: Secondary | ICD-10-CM | POA: Diagnosis not present

## 2019-10-30 DIAGNOSIS — K5900 Constipation, unspecified: Secondary | ICD-10-CM | POA: Diagnosis not present

## 2019-10-30 DIAGNOSIS — G2581 Restless legs syndrome: Secondary | ICD-10-CM | POA: Diagnosis not present

## 2019-10-30 DIAGNOSIS — M5136 Other intervertebral disc degeneration, lumbar region: Secondary | ICD-10-CM | POA: Diagnosis not present

## 2019-10-30 DIAGNOSIS — E1142 Type 2 diabetes mellitus with diabetic polyneuropathy: Secondary | ICD-10-CM | POA: Diagnosis not present

## 2019-10-30 DIAGNOSIS — D371 Neoplasm of uncertain behavior of stomach: Secondary | ICD-10-CM | POA: Diagnosis not present

## 2019-10-30 DIAGNOSIS — Z859 Personal history of malignant neoplasm, unspecified: Secondary | ICD-10-CM | POA: Diagnosis not present

## 2019-10-30 DIAGNOSIS — J45909 Unspecified asthma, uncomplicated: Secondary | ICD-10-CM | POA: Diagnosis not present

## 2019-10-30 DIAGNOSIS — I119 Hypertensive heart disease without heart failure: Secondary | ICD-10-CM | POA: Diagnosis not present

## 2019-10-30 DIAGNOSIS — I6529 Occlusion and stenosis of unspecified carotid artery: Secondary | ICD-10-CM | POA: Diagnosis not present

## 2019-10-30 DIAGNOSIS — D375 Neoplasm of uncertain behavior of rectum: Secondary | ICD-10-CM | POA: Diagnosis not present

## 2019-10-31 ENCOUNTER — Other Ambulatory Visit: Payer: Self-pay | Admitting: Gastroenterology

## 2019-11-06 DIAGNOSIS — K219 Gastro-esophageal reflux disease without esophagitis: Secondary | ICD-10-CM | POA: Diagnosis not present

## 2019-11-06 DIAGNOSIS — K5900 Constipation, unspecified: Secondary | ICD-10-CM | POA: Diagnosis not present

## 2019-11-06 DIAGNOSIS — J45909 Unspecified asthma, uncomplicated: Secondary | ICD-10-CM | POA: Diagnosis not present

## 2019-11-06 DIAGNOSIS — Z7952 Long term (current) use of systemic steroids: Secondary | ICD-10-CM | POA: Diagnosis not present

## 2019-11-06 DIAGNOSIS — I119 Hypertensive heart disease without heart failure: Secondary | ICD-10-CM | POA: Diagnosis not present

## 2019-11-06 DIAGNOSIS — Z9181 History of falling: Secondary | ICD-10-CM | POA: Diagnosis not present

## 2019-11-06 DIAGNOSIS — D352 Benign neoplasm of pituitary gland: Secondary | ICD-10-CM | POA: Diagnosis not present

## 2019-11-06 DIAGNOSIS — D371 Neoplasm of uncertain behavior of stomach: Secondary | ICD-10-CM | POA: Diagnosis not present

## 2019-11-06 DIAGNOSIS — M5136 Other intervertebral disc degeneration, lumbar region: Secondary | ICD-10-CM | POA: Diagnosis not present

## 2019-11-06 DIAGNOSIS — E039 Hypothyroidism, unspecified: Secondary | ICD-10-CM | POA: Diagnosis not present

## 2019-11-06 DIAGNOSIS — G2581 Restless legs syndrome: Secondary | ICD-10-CM | POA: Diagnosis not present

## 2019-11-06 DIAGNOSIS — Z86010 Personal history of colonic polyps: Secondary | ICD-10-CM | POA: Diagnosis not present

## 2019-11-06 DIAGNOSIS — Z859 Personal history of malignant neoplasm, unspecified: Secondary | ICD-10-CM | POA: Diagnosis not present

## 2019-11-06 DIAGNOSIS — I6529 Occlusion and stenosis of unspecified carotid artery: Secondary | ICD-10-CM | POA: Diagnosis not present

## 2019-11-06 DIAGNOSIS — E1142 Type 2 diabetes mellitus with diabetic polyneuropathy: Secondary | ICD-10-CM | POA: Diagnosis not present

## 2019-11-06 DIAGNOSIS — D375 Neoplasm of uncertain behavior of rectum: Secondary | ICD-10-CM | POA: Diagnosis not present

## 2019-11-10 DIAGNOSIS — Z86010 Personal history of colonic polyps: Secondary | ICD-10-CM | POA: Diagnosis not present

## 2019-11-10 DIAGNOSIS — Z859 Personal history of malignant neoplasm, unspecified: Secondary | ICD-10-CM | POA: Diagnosis not present

## 2019-11-10 DIAGNOSIS — I6529 Occlusion and stenosis of unspecified carotid artery: Secondary | ICD-10-CM | POA: Diagnosis not present

## 2019-11-10 DIAGNOSIS — D371 Neoplasm of uncertain behavior of stomach: Secondary | ICD-10-CM | POA: Diagnosis not present

## 2019-11-10 DIAGNOSIS — G2581 Restless legs syndrome: Secondary | ICD-10-CM | POA: Diagnosis not present

## 2019-11-10 DIAGNOSIS — K219 Gastro-esophageal reflux disease without esophagitis: Secondary | ICD-10-CM | POA: Diagnosis not present

## 2019-11-10 DIAGNOSIS — J45909 Unspecified asthma, uncomplicated: Secondary | ICD-10-CM | POA: Diagnosis not present

## 2019-11-10 DIAGNOSIS — K5900 Constipation, unspecified: Secondary | ICD-10-CM | POA: Diagnosis not present

## 2019-11-10 DIAGNOSIS — D375 Neoplasm of uncertain behavior of rectum: Secondary | ICD-10-CM | POA: Diagnosis not present

## 2019-11-10 DIAGNOSIS — I119 Hypertensive heart disease without heart failure: Secondary | ICD-10-CM | POA: Diagnosis not present

## 2019-11-10 DIAGNOSIS — E039 Hypothyroidism, unspecified: Secondary | ICD-10-CM | POA: Diagnosis not present

## 2019-11-10 DIAGNOSIS — Z7952 Long term (current) use of systemic steroids: Secondary | ICD-10-CM | POA: Diagnosis not present

## 2019-11-10 DIAGNOSIS — D352 Benign neoplasm of pituitary gland: Secondary | ICD-10-CM | POA: Diagnosis not present

## 2019-11-10 DIAGNOSIS — M5136 Other intervertebral disc degeneration, lumbar region: Secondary | ICD-10-CM | POA: Diagnosis not present

## 2019-11-10 DIAGNOSIS — E1142 Type 2 diabetes mellitus with diabetic polyneuropathy: Secondary | ICD-10-CM | POA: Diagnosis not present

## 2019-11-10 DIAGNOSIS — Z9181 History of falling: Secondary | ICD-10-CM | POA: Diagnosis not present

## 2019-11-12 DIAGNOSIS — E039 Hypothyroidism, unspecified: Secondary | ICD-10-CM | POA: Diagnosis not present

## 2019-11-12 DIAGNOSIS — J45909 Unspecified asthma, uncomplicated: Secondary | ICD-10-CM | POA: Diagnosis not present

## 2019-11-12 DIAGNOSIS — E1142 Type 2 diabetes mellitus with diabetic polyneuropathy: Secondary | ICD-10-CM | POA: Diagnosis not present

## 2019-11-12 DIAGNOSIS — I119 Hypertensive heart disease without heart failure: Secondary | ICD-10-CM | POA: Diagnosis not present

## 2019-11-12 DIAGNOSIS — D375 Neoplasm of uncertain behavior of rectum: Secondary | ICD-10-CM | POA: Diagnosis not present

## 2019-11-12 DIAGNOSIS — G2581 Restless legs syndrome: Secondary | ICD-10-CM | POA: Diagnosis not present

## 2019-11-12 DIAGNOSIS — Z7952 Long term (current) use of systemic steroids: Secondary | ICD-10-CM | POA: Diagnosis not present

## 2019-11-12 DIAGNOSIS — Z9181 History of falling: Secondary | ICD-10-CM | POA: Diagnosis not present

## 2019-11-12 DIAGNOSIS — M5136 Other intervertebral disc degeneration, lumbar region: Secondary | ICD-10-CM | POA: Diagnosis not present

## 2019-11-12 DIAGNOSIS — Z86010 Personal history of colonic polyps: Secondary | ICD-10-CM | POA: Diagnosis not present

## 2019-11-12 DIAGNOSIS — K219 Gastro-esophageal reflux disease without esophagitis: Secondary | ICD-10-CM | POA: Diagnosis not present

## 2019-11-12 DIAGNOSIS — K5900 Constipation, unspecified: Secondary | ICD-10-CM | POA: Diagnosis not present

## 2019-11-12 DIAGNOSIS — D352 Benign neoplasm of pituitary gland: Secondary | ICD-10-CM | POA: Diagnosis not present

## 2019-11-12 DIAGNOSIS — Z859 Personal history of malignant neoplasm, unspecified: Secondary | ICD-10-CM | POA: Diagnosis not present

## 2019-11-12 DIAGNOSIS — D371 Neoplasm of uncertain behavior of stomach: Secondary | ICD-10-CM | POA: Diagnosis not present

## 2019-11-12 DIAGNOSIS — I6529 Occlusion and stenosis of unspecified carotid artery: Secondary | ICD-10-CM | POA: Diagnosis not present

## 2019-11-17 DIAGNOSIS — I6529 Occlusion and stenosis of unspecified carotid artery: Secondary | ICD-10-CM | POA: Diagnosis not present

## 2019-11-17 DIAGNOSIS — J45909 Unspecified asthma, uncomplicated: Secondary | ICD-10-CM | POA: Diagnosis not present

## 2019-11-17 DIAGNOSIS — G2581 Restless legs syndrome: Secondary | ICD-10-CM | POA: Diagnosis not present

## 2019-11-17 DIAGNOSIS — K219 Gastro-esophageal reflux disease without esophagitis: Secondary | ICD-10-CM | POA: Diagnosis not present

## 2019-11-17 DIAGNOSIS — D371 Neoplasm of uncertain behavior of stomach: Secondary | ICD-10-CM | POA: Diagnosis not present

## 2019-11-17 DIAGNOSIS — K5900 Constipation, unspecified: Secondary | ICD-10-CM | POA: Diagnosis not present

## 2019-11-17 DIAGNOSIS — Z859 Personal history of malignant neoplasm, unspecified: Secondary | ICD-10-CM | POA: Diagnosis not present

## 2019-11-17 DIAGNOSIS — D352 Benign neoplasm of pituitary gland: Secondary | ICD-10-CM | POA: Diagnosis not present

## 2019-11-17 DIAGNOSIS — D375 Neoplasm of uncertain behavior of rectum: Secondary | ICD-10-CM | POA: Diagnosis not present

## 2019-11-17 DIAGNOSIS — Z9181 History of falling: Secondary | ICD-10-CM | POA: Diagnosis not present

## 2019-11-17 DIAGNOSIS — Z7952 Long term (current) use of systemic steroids: Secondary | ICD-10-CM | POA: Diagnosis not present

## 2019-11-17 DIAGNOSIS — M5136 Other intervertebral disc degeneration, lumbar region: Secondary | ICD-10-CM | POA: Diagnosis not present

## 2019-11-17 DIAGNOSIS — I119 Hypertensive heart disease without heart failure: Secondary | ICD-10-CM | POA: Diagnosis not present

## 2019-11-17 DIAGNOSIS — Z86010 Personal history of colonic polyps: Secondary | ICD-10-CM | POA: Diagnosis not present

## 2019-11-17 DIAGNOSIS — E1142 Type 2 diabetes mellitus with diabetic polyneuropathy: Secondary | ICD-10-CM | POA: Diagnosis not present

## 2019-11-17 DIAGNOSIS — E039 Hypothyroidism, unspecified: Secondary | ICD-10-CM | POA: Diagnosis not present

## 2019-11-19 DIAGNOSIS — K219 Gastro-esophageal reflux disease without esophagitis: Secondary | ICD-10-CM | POA: Diagnosis not present

## 2019-11-19 DIAGNOSIS — K5900 Constipation, unspecified: Secondary | ICD-10-CM | POA: Diagnosis not present

## 2019-11-19 DIAGNOSIS — Z9181 History of falling: Secondary | ICD-10-CM | POA: Diagnosis not present

## 2019-11-19 DIAGNOSIS — M5136 Other intervertebral disc degeneration, lumbar region: Secondary | ICD-10-CM | POA: Diagnosis not present

## 2019-11-19 DIAGNOSIS — D352 Benign neoplasm of pituitary gland: Secondary | ICD-10-CM | POA: Diagnosis not present

## 2019-11-19 DIAGNOSIS — Z86010 Personal history of colonic polyps: Secondary | ICD-10-CM | POA: Diagnosis not present

## 2019-11-19 DIAGNOSIS — E1142 Type 2 diabetes mellitus with diabetic polyneuropathy: Secondary | ICD-10-CM | POA: Diagnosis not present

## 2019-11-19 DIAGNOSIS — Z7952 Long term (current) use of systemic steroids: Secondary | ICD-10-CM | POA: Diagnosis not present

## 2019-11-19 DIAGNOSIS — G2581 Restless legs syndrome: Secondary | ICD-10-CM | POA: Diagnosis not present

## 2019-11-19 DIAGNOSIS — D375 Neoplasm of uncertain behavior of rectum: Secondary | ICD-10-CM | POA: Diagnosis not present

## 2019-11-19 DIAGNOSIS — D371 Neoplasm of uncertain behavior of stomach: Secondary | ICD-10-CM | POA: Diagnosis not present

## 2019-11-19 DIAGNOSIS — I119 Hypertensive heart disease without heart failure: Secondary | ICD-10-CM | POA: Diagnosis not present

## 2019-11-19 DIAGNOSIS — Z859 Personal history of malignant neoplasm, unspecified: Secondary | ICD-10-CM | POA: Diagnosis not present

## 2019-11-19 DIAGNOSIS — I6529 Occlusion and stenosis of unspecified carotid artery: Secondary | ICD-10-CM | POA: Diagnosis not present

## 2019-11-19 DIAGNOSIS — E039 Hypothyroidism, unspecified: Secondary | ICD-10-CM | POA: Diagnosis not present

## 2019-11-19 DIAGNOSIS — J45909 Unspecified asthma, uncomplicated: Secondary | ICD-10-CM | POA: Diagnosis not present

## 2019-11-24 DIAGNOSIS — M5136 Other intervertebral disc degeneration, lumbar region: Secondary | ICD-10-CM | POA: Diagnosis not present

## 2019-11-24 DIAGNOSIS — J45909 Unspecified asthma, uncomplicated: Secondary | ICD-10-CM | POA: Diagnosis not present

## 2019-11-24 DIAGNOSIS — K5900 Constipation, unspecified: Secondary | ICD-10-CM | POA: Diagnosis not present

## 2019-11-24 DIAGNOSIS — Z859 Personal history of malignant neoplasm, unspecified: Secondary | ICD-10-CM | POA: Diagnosis not present

## 2019-11-24 DIAGNOSIS — G2581 Restless legs syndrome: Secondary | ICD-10-CM | POA: Diagnosis not present

## 2019-11-24 DIAGNOSIS — D375 Neoplasm of uncertain behavior of rectum: Secondary | ICD-10-CM | POA: Diagnosis not present

## 2019-11-24 DIAGNOSIS — E039 Hypothyroidism, unspecified: Secondary | ICD-10-CM | POA: Diagnosis not present

## 2019-11-24 DIAGNOSIS — K219 Gastro-esophageal reflux disease without esophagitis: Secondary | ICD-10-CM | POA: Diagnosis not present

## 2019-11-24 DIAGNOSIS — D371 Neoplasm of uncertain behavior of stomach: Secondary | ICD-10-CM | POA: Diagnosis not present

## 2019-11-24 DIAGNOSIS — Z9181 History of falling: Secondary | ICD-10-CM | POA: Diagnosis not present

## 2019-11-24 DIAGNOSIS — D352 Benign neoplasm of pituitary gland: Secondary | ICD-10-CM | POA: Diagnosis not present

## 2019-11-24 DIAGNOSIS — I6529 Occlusion and stenosis of unspecified carotid artery: Secondary | ICD-10-CM | POA: Diagnosis not present

## 2019-11-24 DIAGNOSIS — Z86010 Personal history of colonic polyps: Secondary | ICD-10-CM | POA: Diagnosis not present

## 2019-11-24 DIAGNOSIS — E1142 Type 2 diabetes mellitus with diabetic polyneuropathy: Secondary | ICD-10-CM | POA: Diagnosis not present

## 2019-11-24 DIAGNOSIS — I119 Hypertensive heart disease without heart failure: Secondary | ICD-10-CM | POA: Diagnosis not present

## 2019-11-24 DIAGNOSIS — Z7952 Long term (current) use of systemic steroids: Secondary | ICD-10-CM | POA: Diagnosis not present

## 2019-12-01 DIAGNOSIS — D352 Benign neoplasm of pituitary gland: Secondary | ICD-10-CM | POA: Diagnosis not present

## 2019-12-01 DIAGNOSIS — J45909 Unspecified asthma, uncomplicated: Secondary | ICD-10-CM | POA: Diagnosis not present

## 2019-12-01 DIAGNOSIS — Z86010 Personal history of colonic polyps: Secondary | ICD-10-CM | POA: Diagnosis not present

## 2019-12-01 DIAGNOSIS — I119 Hypertensive heart disease without heart failure: Secondary | ICD-10-CM | POA: Diagnosis not present

## 2019-12-01 DIAGNOSIS — Z859 Personal history of malignant neoplasm, unspecified: Secondary | ICD-10-CM | POA: Diagnosis not present

## 2019-12-01 DIAGNOSIS — I6529 Occlusion and stenosis of unspecified carotid artery: Secondary | ICD-10-CM | POA: Diagnosis not present

## 2019-12-01 DIAGNOSIS — E039 Hypothyroidism, unspecified: Secondary | ICD-10-CM | POA: Diagnosis not present

## 2019-12-01 DIAGNOSIS — K219 Gastro-esophageal reflux disease without esophagitis: Secondary | ICD-10-CM | POA: Diagnosis not present

## 2019-12-01 DIAGNOSIS — K5900 Constipation, unspecified: Secondary | ICD-10-CM | POA: Diagnosis not present

## 2019-12-01 DIAGNOSIS — Z7952 Long term (current) use of systemic steroids: Secondary | ICD-10-CM | POA: Diagnosis not present

## 2019-12-01 DIAGNOSIS — M5136 Other intervertebral disc degeneration, lumbar region: Secondary | ICD-10-CM | POA: Diagnosis not present

## 2019-12-01 DIAGNOSIS — E1142 Type 2 diabetes mellitus with diabetic polyneuropathy: Secondary | ICD-10-CM | POA: Diagnosis not present

## 2019-12-01 DIAGNOSIS — D371 Neoplasm of uncertain behavior of stomach: Secondary | ICD-10-CM | POA: Diagnosis not present

## 2019-12-01 DIAGNOSIS — Z9181 History of falling: Secondary | ICD-10-CM | POA: Diagnosis not present

## 2019-12-01 DIAGNOSIS — G2581 Restless legs syndrome: Secondary | ICD-10-CM | POA: Diagnosis not present

## 2019-12-01 DIAGNOSIS — D375 Neoplasm of uncertain behavior of rectum: Secondary | ICD-10-CM | POA: Diagnosis not present

## 2019-12-09 DIAGNOSIS — M5136 Other intervertebral disc degeneration, lumbar region: Secondary | ICD-10-CM | POA: Diagnosis not present

## 2019-12-09 DIAGNOSIS — Z7952 Long term (current) use of systemic steroids: Secondary | ICD-10-CM | POA: Diagnosis not present

## 2019-12-09 DIAGNOSIS — I6529 Occlusion and stenosis of unspecified carotid artery: Secondary | ICD-10-CM | POA: Diagnosis not present

## 2019-12-09 DIAGNOSIS — G2581 Restless legs syndrome: Secondary | ICD-10-CM | POA: Diagnosis not present

## 2019-12-09 DIAGNOSIS — D371 Neoplasm of uncertain behavior of stomach: Secondary | ICD-10-CM | POA: Diagnosis not present

## 2019-12-09 DIAGNOSIS — E039 Hypothyroidism, unspecified: Secondary | ICD-10-CM | POA: Diagnosis not present

## 2019-12-09 DIAGNOSIS — D352 Benign neoplasm of pituitary gland: Secondary | ICD-10-CM | POA: Diagnosis not present

## 2019-12-09 DIAGNOSIS — Z9181 History of falling: Secondary | ICD-10-CM | POA: Diagnosis not present

## 2019-12-09 DIAGNOSIS — K219 Gastro-esophageal reflux disease without esophagitis: Secondary | ICD-10-CM | POA: Diagnosis not present

## 2019-12-09 DIAGNOSIS — E1142 Type 2 diabetes mellitus with diabetic polyneuropathy: Secondary | ICD-10-CM | POA: Diagnosis not present

## 2019-12-09 DIAGNOSIS — Z859 Personal history of malignant neoplasm, unspecified: Secondary | ICD-10-CM | POA: Diagnosis not present

## 2019-12-09 DIAGNOSIS — D375 Neoplasm of uncertain behavior of rectum: Secondary | ICD-10-CM | POA: Diagnosis not present

## 2019-12-09 DIAGNOSIS — K5900 Constipation, unspecified: Secondary | ICD-10-CM | POA: Diagnosis not present

## 2019-12-09 DIAGNOSIS — J45909 Unspecified asthma, uncomplicated: Secondary | ICD-10-CM | POA: Diagnosis not present

## 2019-12-09 DIAGNOSIS — I119 Hypertensive heart disease without heart failure: Secondary | ICD-10-CM | POA: Diagnosis not present

## 2019-12-09 DIAGNOSIS — Z86010 Personal history of colonic polyps: Secondary | ICD-10-CM | POA: Diagnosis not present

## 2020-01-11 ENCOUNTER — Other Ambulatory Visit: Payer: Self-pay | Admitting: Gastroenterology

## 2020-01-15 NOTE — Telephone Encounter (Signed)
Please advise 

## 2020-01-23 ENCOUNTER — Ambulatory Visit: Payer: Medicare Other | Admitting: Podiatry

## 2020-02-27 ENCOUNTER — Ambulatory Visit: Payer: Medicare Other | Admitting: Podiatry

## 2020-10-13 ENCOUNTER — Other Ambulatory Visit (HOSPITAL_COMMUNITY): Payer: Self-pay | Admitting: Internal Medicine

## 2020-10-13 DIAGNOSIS — R011 Cardiac murmur, unspecified: Secondary | ICD-10-CM

## 2020-11-05 ENCOUNTER — Other Ambulatory Visit (HOSPITAL_COMMUNITY): Payer: Medicare Other

## 2020-11-30 ENCOUNTER — Other Ambulatory Visit (HOSPITAL_COMMUNITY): Payer: Medicare (Managed Care)

## 2020-12-20 ENCOUNTER — Other Ambulatory Visit (HOSPITAL_COMMUNITY): Payer: Medicare (Managed Care)

## 2021-01-03 ENCOUNTER — Other Ambulatory Visit (HOSPITAL_COMMUNITY): Payer: Medicare (Managed Care)

## 2021-01-14 ENCOUNTER — Ambulatory Visit (HOSPITAL_COMMUNITY): Payer: Medicare (Managed Care) | Attending: Cardiology

## 2021-01-14 ENCOUNTER — Other Ambulatory Visit: Payer: Self-pay

## 2021-01-14 DIAGNOSIS — R011 Cardiac murmur, unspecified: Secondary | ICD-10-CM | POA: Diagnosis present

## 2021-01-14 LAB — ECHOCARDIOGRAM COMPLETE
AR max vel: 0.67 cm2
AV Area VTI: 0.81 cm2
AV Area mean vel: 0.65 cm2
AV Mean grad: 22.3 mmHg
AV Peak grad: 38 mmHg
Ao pk vel: 3.08 m/s
Area-P 1/2: 3.68 cm2
S' Lateral: 2.2 cm

## 2021-01-18 ENCOUNTER — Encounter: Payer: Self-pay | Admitting: General Practice

## 2021-01-24 NOTE — Progress Notes (Deleted)
Cardiology Office Note:    Date:  01/24/2021   ID:  Mandy Webb, DOB [birth year], MRN [id number]  PCP:  Lucianne Lei, MD  [facility] Cardiologist:  No primary care provider on file.  [facility] Electrophysiologist:  None   Referring MD: [name], MD    History of Present Illness:    Mandy Webb is a 75 y.o. female with a hx of DMII, HTN, HLD, and GERD who was referred by Dr. Bradd Burner for further evaluation of aortic stenosis.  TTE showed moderate AS, preserved LVEF 60-65%, mild-to-moderate MR  Past Medical History:  Diagnosis Date  . Allergy   . Anemia   . Arthritis   . Asthma   . Diabetes mellitus   . GERD (gastroesophageal reflux disease)   . Hyperlipidemia   . Hypertension     Past Surgical History:  Procedure Laterality Date  . APPENDECTOMY    . COLONOSCOPY    . PITUITARY SURGERY    . TUBAL LIGATION      Current Medications: No outpatient medications have been marked as taking for the 01/27/21 encounter (Appointment) with Mandy Bergeron, MD.     Allergies:   Protonix [pantoprazole sodium]   Social History   Socioeconomic History  . Marital status: Single    Spouse name: Not on file  . Number of children: 3  . Years of education: Not on file  . Highest education level: Not on file  Occupational History  . Occupation: Retired    Fish farm manager: UNEMPLOYED  Tobacco Use  . Smoking status: Former Smoker    Quit date: 02/02/1969    Years since quitting: 52.0  . Smokeless tobacco: Never Used  Vaping Use  . Vaping Use: Never used  Substance and Sexual Activity  . Alcohol use: No    Alcohol/week: 0.0 standard drinks  . Drug use: No  . Sexual activity: Never  Other Topics Concern  . Not on file  Social History Narrative   Lives alone;   1 daughter lives in [place]   1 son lives in [place] Determinants of Health   Financial Resource Strain: Not on file  Food Insecurity: Not on file  Transportation Needs: Not on  file  Physical Activity: Not on file  Stress: Not on file  Social Connections: Not on file     Family History: The patient's ***family history includes Cancer in her mother; Heart disease in her brother; Hypertension in her father; Stomach cancer in her mother. There is no history of Esophageal cancer, Colon polyps, or Rectal cancer.  ROS:   Please see the history of present illness.    *** All other systems reviewed and are negative.  EKGs/Labs/Other Studies Reviewed:    The following studies were reviewed today: TTE 02/13/2021: IMPRESSIONS  1. Moderate aortic valve stenosis.  2. Left ventricular ejection fraction, by estimation, is 60 to 65%. The  left ventricle has normal function. The left ventricle has no regional  wall motion abnormalities. Left ventricular diastolic parameters are  consistent with Grade I diastolic  dysfunction (impaired relaxation). The average left ventricular global  longitudinal strain is -21.5 %. The global longitudinal strain is normal.  3. Right ventricular systolic function is normal. The right ventricular  size is normal. There is normal pulmonary artery systolic pressure.  4. The mitral valve is normal in structure. Mild to moderate mitral valve  regurgitation. No evidence of mitral stenosis.  5. The aortic valve is calcified. There is moderate calcification  of the  aortic valve. There is moderate thickening of the aortic valve. Aortic  valve regurgitation is not visualized. Moderate aortic valve stenosis.  Aortic valve mean gradient measures  22.2 mmHg. Aortic valve Vmax measures 3.08 m/s.  6. The inferior vena cava is normal in size with greater than 50%  respiratory variability, suggesting right atrial pressure of 3 mmHg.   EKG:  EKG is *** ordered today.  The ekg ordered today demonstrates ***  Recent Labs: No results found for requested labs within last 8760 hours.  Recent Lipid Panel    Component Value Date/Time   CHOL 192  12/16/2014 1005   TRIG 165 (H) 12/16/2014 1005   HDL 53 12/16/2014 1005   CHOLHDL 3.6 12/16/2014 1005   VLDL 33 12/16/2014 1005   LDLCALC 106 (H) 12/16/2014 1005   LDLDIRECT 89 03/23/2015 1620     Risk Assessment/Calculations:   {Does this patient have ATRIAL FIBRILLATION?:[phone]}   Physical Exam:    VS:  There were no vitals taken for this visit.    Wt Readings from Last 3 Encounters:  05/23/19 160 lb (72.6 kg)  05/14/19 160 lb (72.6 kg)  05/16/18 166 lb (75.3 kg)     GEN: *** Well nourished, well developed in no acute distress HEENT: Normal NECK: No JVD; No carotid bruits LYMPHATICS: No lymphadenopathy CARDIAC: ***RRR, no murmurs, rubs, gallops RESPIRATORY:  Clear to auscultation without rales, wheezing or rhonchi  ABDOMEN: Soft, non-tender, non-distended MUSCULOSKELETAL:  No edema; No deformity  SKIN: Warm and dry NEUROLOGIC:  Alert and oriented x 3 PSYCHIATRIC:  Normal affect   ASSESSMENT:    No diagnosis found. PLAN:    In order of problems listed above:  #Moderate AS: Discovered on TTE 12/2020. LVEF normal.  -Serial TTEs for monitoring -Ensure aggressive blood pressure control  #HTN:  #HLD:  #DMII:   {Are you ordering a CV Procedure (e.g. stress test, cath, DCCV, TEE, etc)?   Press F2        :[id number]}    Medication Adjustments/Labs and Tests Ordered: Current medicines are reviewed at length with the patient today.  Concerns regarding medicines are outlined above.  No orders of the defined types were placed in this encounter.  No orders of the defined types were placed in this encounter.   There are no Patient Instructions on file for this visit.   Signed, Mandy Bergeron, MD  01/24/2021 11:48 AM    [place]

## 2021-01-27 ENCOUNTER — Ambulatory Visit: Payer: Medicare (Managed Care) | Admitting: Cardiology

## 2021-02-13 NOTE — Progress Notes (Signed)
Cardiology Office Note:    Date:  02/15/2021   ID:  Mandy Webb, DOB 12/27/1945, MRN [id number]  PCP:  Bland, Veita, MD   [facility]  Cardiologist:  No primary care provider on file.  Advanced Practice Provider:  No care team member to display Electrophysiologist:  None    Referring MD: Koehler, Robert N, MD    History of Present Illness:    Mandy Webb is a 74 y.o. female with a hx of DMII, HTN, HLD, and moderate aortic stenosis who was referred by Dr. Koehler for further evaluation of AS.  The patient states that she overall feels well. Denies chest pain, SOB, LE edema, orthopnea, PND, or dizziness. Has some DOE when walking to the mailbox, which is stable for her. She denies any known personal history of coronary artery disease. TTE with LVEF 60-65%, moderate aortic stenosis with mean gradient 22.2mg and peak 3.08m/s.  Past Medical History:  Diagnosis Date  . Allergy   . Anemia   . Aortic stenosis   . Arthritis   . Asthma   . Diabetes mellitus   . GERD (gastroesophageal reflux disease)   . Hyperlipidemia   . Hypertension   . Low blood pressure reading   . Syncope     Past Surgical History:  Procedure Laterality Date  . APPENDECTOMY    . COLONOSCOPY    . PITUITARY SURGERY    . TUBAL LIGATION      Current Medications: Current Meds  Medication Sig  . albuterol (PROVENTIL HFA;VENTOLIN HFA) 108 (90 BASE) MCG/ACT inhaler Inhale 2 puffs into the lungs every 6 (six) hours as needed for wheezing (cough, shortness of breath or wheezing.).  . ammonium lactate (LAC-HYDRIN) 12 % cream Apply topically as needed for dry skin.  . atorvastatin (LIPITOR) 40 MG tablet Take 1 tablet (40 mg total) by mouth daily.  . Blood Glucose Monitoring Suppl (BLOOD GLUCOSE METER) kit Use as instructed  . Blood Pressure KIT Please check you blood pressure after resting for five minutes.  Call [facility] if you pressure is consistently higher than 150/90.  .  brinzolamide (AZOPT) 1 % ophthalmic suspension Place 1 drop into both eyes 2 (two) times daily.   . Cholecalciferol (VITAMIN D3) 1.25 MG (50000 UT) CAPS Take 1 capsule by mouth once a week.  . FLOVENT HFA 220 MCG/ACT inhaler INHALE 1 PUFF BY MOUTH TWICE DAILY FOR 30 DAYS  . gabapentin (NEURONTIN) 600 MG tablet TAKE ONE TABLET BY MOUTH 4 TIMES DAILY  . glucose blood (ONE TOUCH ULTRA TEST) test strip Use to test blood sugar daily. Dx code: E11.9  . indapamide (LOZOL) 1.25 MG tablet   . Lancets (ONETOUCH ULTRASOFT) lancets Test blood sugar daily. Dx code: E11.9  . levothyroxine (SYNTHROID, LEVOTHROID) 88 MCG tablet TAKE 1 TABLET BY MOUTH ONCE DAILY FOR 90 DAYS  . linaclotide (LINZESS) 145 MCG CAPS capsule Take 1 capsule (145 mcg total) by mouth daily.  . metFORMIN (GLUCOPHAGE) 500 MG tablet Take 500 mg by mouth at bedtime.  . metoCLOPramide (REGLAN) 10 MG tablet TAKE 1 TABLET BY MOUTH ONCE DAILY **PATIENT  NEEDS  AN  APPOINTMENT  WITH  DR.  JACOBS  FOR  FUTHER  REFILLS**  . montelukast (SINGULAIR) 10 MG tablet TAKE ONE TABLET BY MOUTH ONCE DAILY AT BEDTIME  . omeprazole (PRILOSEC) 20 MG capsule TAKE ONE CAPSULE BY MOUTH ONCE DAILY  . polyethylene glycol powder (GLYCOLAX/MIRALAX) powder TAKE 17 G BY MOUTH DAILY  . predniSONE (DELTASONE)   2.5 MG tablet   . ramipril (ALTACE) 5 MG capsule TAKE ONE CAPSULE BY MOUTH ONCE DAILY  . triamcinolone (KENALOG) 0.025 % cream Apply 1 application topically 2 (two) times daily as needed (for skin).  Marland Kitchen ZIOPTAN 0.0015 % SOLN INSTILL 1 DROP INTO EACH EYE ONCE DAILY  . [DISCONTINUED] atorvastatin (LIPITOR) 20 MG tablet TAKE ONE TABLET BY MOUTH ONCE DAILY     Allergies:   Protonix [pantoprazole sodium]   Social History   Socioeconomic History  . Marital status: Single    Spouse name: Not on file  . Number of children: 3  . Years of education: Not on file  . Highest education level: Not on file  Occupational History  . Occupation: Retired    Fish farm manager:  UNEMPLOYED  Tobacco Use  . Smoking status: Former Smoker    Quit date: 02/02/1969    Years since quitting: 52.0  . Smokeless tobacco: Never Used  Vaping Use  . Vaping Use: Never used  Substance and Sexual Activity  . Alcohol use: No    Alcohol/week: 0.0 standard drinks  . Drug use: No  . Sexual activity: Never  Other Topics Concern  . Not on file  Social History Narrative   Lives alone;   1 daughter lives in [place]   1 son lives in [place] Determinants of Health   Financial Resource Strain: Not on file  Food Insecurity: Not on file  Transportation Needs: Not on file  Physical Activity: Not on file  Stress: Not on file  Social Connections: Not on file     Family History: The patient's family history includes Cancer in her mother; Heart disease in her brother; Hypertension in her father; Stomach cancer in her mother. There is no history of Esophageal cancer, Colon polyps, or Rectal cancer.  ROS:   Please see the history of present illness.    Review of Systems  Constitutional: Negative for chills and fever.  HENT: Negative for sore throat.   Eyes: Negative for blurred vision and redness.  Respiratory: Positive for shortness of breath.   Cardiovascular: Negative for chest pain, palpitations, orthopnea, claudication, leg swelling and PND.  Gastrointestinal: Negative for melena, nausea and vomiting.  Genitourinary: Negative for dysuria.  Musculoskeletal: Positive for joint pain and myalgias.  Neurological: Negative for dizziness and loss of consciousness.  Endo/Heme/Allergies: Negative for polydipsia.  Psychiatric/Behavioral: Negative for substance abuse.    EKGs/Labs/Other Studies Reviewed:    The following studies were reviewed today: TTE 26-Jan-2021: IMPRESSIONS  1. Moderate aortic valve stenosis.  2. Left ventricular ejection fraction, by estimation, is 60 to 65%. The  left ventricle has normal function. The left ventricle has no regional  wall  motion abnormalities. Left ventricular diastolic parameters are  consistent with Grade I diastolic  dysfunction (impaired relaxation). The average left ventricular global  longitudinal strain is -21.5 %. The global longitudinal strain is normal.  3. Right ventricular systolic function is normal. The right ventricular  size is normal. There is normal pulmonary artery systolic pressure.  4. The mitral valve is normal in structure. Mild to moderate mitral valve  regurgitation. No evidence of mitral stenosis.  5. The aortic valve is calcified. There is moderate calcification of the  aortic valve. There is moderate thickening of the aortic valve. Aortic  valve regurgitation is not visualized. Moderate aortic valve stenosis.  Aortic valve mean gradient measures  22.2 mmHg. Aortic valve Vmax measures 3.08 m/s.  6. The inferior vena cava is normal in  size with greater than 50%  respiratory variability, suggesting right atrial pressure of 3 mmHg.   EKG:  EKG is  ordered today.  The ekg ordered today demonstrates NSR with HR 62  Recent Labs: No results found for requested labs within last 8760 hours.  Recent Lipid Panel    Component Value Date/Time   CHOL 192 12/16/2014 1005   TRIG 165 (H) 12/16/2014 1005   HDL 53 12/16/2014 1005   CHOLHDL 3.6 12/16/2014 1005   VLDL 33 12/16/2014 1005   LDLCALC 106 (H) 12/16/2014 1005   LDLDIRECT 89 03/23/2015 1620     Physical Exam:    VS:  BP 108/72   Pulse 62   Ht 5' 1" (1.549 m)   Wt 128 lb (58.1 kg)   BMI 24.19 kg/m     Wt Readings from Last 3 Encounters:  02/15/21 128 lb (58.1 kg)  05/23/19 160 lb (72.6 kg)  05/14/19 160 lb (72.6 kg)     GEN:  Well nourished, well developed in no acute distress HEENT: Normal NECK: No JVD; No carotid bruits CARDIAC: RRR, 3/6 harsh nearly holosytolic murmur that radiates to the carotids. No rubs, gallops RESPIRATORY:  Clear to auscultation without rales, wheezing or rhonchi  ABDOMEN: Soft,  non-tender, non-distended MUSCULOSKELETAL:  No edema; No deformity  SKIN: Warm and dry NEUROLOGIC:  Alert and oriented x 3 PSYCHIATRIC:  Normal affect   ASSESSMENT:    1. Moderate aortic stenosis   2. Essential hypertension   3. Mixed hyperlipidemia   4. Type 2 diabetes mellitus without complication, with long-term current use of insulin (HCC)    PLAN:    In order of problems listed above:  #Moderate AS: TTE with LVEF 55-60%. Moderate aortic valve stenosis withmean gradient measures 22.2 mmHg. Vmax.08 m/s. Patient reports DOE but this is overall stable. No chest pain, lightheadedness, or HF symptoms. Will need close serial monitoring -See back in 6 months to assess symptoms -If stable, plan for repeat TTE in 1 year to monitor AS -Discussed that if she progresses to severe, likely with need TAVR vs surgical AVR  #HTN: Well controlled. -Continue ramipril 30m daily  #HLD: LDL 117 -Increase atorvastatin to 445mdaily  #DMII: -Continue insulin and metformin   Medication Adjustments/Labs and Tests Ordered: Current medicines are reviewed at length with the patient today.  Concerns regarding medicines are outlined above.  Orders Placed This Encounter  Procedures  . EKG 12-Lead   Meds ordered this encounter  Medications  . atorvastatin (LIPITOR) 40 MG tablet    Sig: Take 1 tablet (40 mg total) by mouth daily.    Dispense:  90 tablet    Refill:  3    Patient Instructions  Medication Instructions:  Increase Atorvastatin (Lipitor) to 40 mg daily   *If you need a refill on your cardiac medications before your next appointment, please call your pharmacy*   Lab Work: None ordered   If you have labs (blood work) drawn today and your tests are completely normal, you will receive your results only by: . Marland KitchenyChart Message (if you have MyChart) OR . A paper copy in the mail If you have any lab test that is abnormal or we need to change your treatment, we will call you to  review the results.   Testing/Procedures: None ordered    Follow-Up: [facility] our [facility].  As part of our continuing mission to provide you with exceptional heart care, we have  created designated Provider Care Teams.  These Care Teams include your primary Cardiologist (physician) and Advanced Practice Providers (APPs -  Physician Assistants and Nurse Practitioners) who all work together to provide you with the care you need, when you need it.  We recommend signing up for the patient portal called "MyChart".  Sign up information is provided on this After Visit Summary.  MyChart is used to connect with patients for Virtual Visits (Telemedicine).  Patients are able to view lab/test results, encounter notes, upcoming appointments, etc.  Non-urgent messages can be sent to your provider as well.   To learn more about what you can do with MyChart, go to [email or web address].    Your next appointment:   6 month(s)  The format for your next appointment:   In Person  Provider:   You may see Dr.   or one of the following Advanced Practice Providers on your designated Care Team:    Scott Weaver, PA-C  Vin Bhagat, PA-C    Other Instructions None      Signed,  E , MD  02/15/2021 3:51 PM    [facility] 

## 2021-02-15 ENCOUNTER — Telehealth: Payer: Self-pay

## 2021-02-15 ENCOUNTER — Other Ambulatory Visit: Payer: Self-pay

## 2021-02-15 ENCOUNTER — Ambulatory Visit (INDEPENDENT_AMBULATORY_CARE_PROVIDER_SITE_OTHER): Payer: Medicare (Managed Care) | Admitting: Cardiology

## 2021-02-15 ENCOUNTER — Encounter: Payer: Self-pay | Admitting: Cardiology

## 2021-02-15 VITALS — BP 108/72 | HR 62 | Ht 61.0 in | Wt 128.0 lb

## 2021-02-15 DIAGNOSIS — E119 Type 2 diabetes mellitus without complications: Secondary | ICD-10-CM | POA: Diagnosis not present

## 2021-02-15 DIAGNOSIS — I350 Nonrheumatic aortic (valve) stenosis: Secondary | ICD-10-CM | POA: Diagnosis not present

## 2021-02-15 DIAGNOSIS — Z794 Long term (current) use of insulin: Secondary | ICD-10-CM

## 2021-02-15 DIAGNOSIS — I10 Essential (primary) hypertension: Secondary | ICD-10-CM | POA: Diagnosis not present

## 2021-02-15 DIAGNOSIS — E782 Mixed hyperlipidemia: Secondary | ICD-10-CM

## 2021-02-15 MED ORDER — ATORVASTATIN CALCIUM 40 MG PO TABS: 40.0000 mg | ORAL_TABLET | Freq: Every day | ORAL | 3 refills | Status: DC

## 2021-02-15 NOTE — Patient Instructions (Signed)
Medication Instructions:  Increase Atorvastatin (Lipitor) to 40 mg daily   *If you need a refill on your cardiac medications before your next appointment, please call your pharmacy*   Lab Work: None ordered   If you have labs (blood work) drawn today and your tests are completely normal, you will receive your results only by: Marland Kitchen MyChart Message (if you have MyChart) OR . A paper copy in the mail If you have any lab test that is abnormal or we need to change your treatment, we will call you to review the results.   Testing/Procedures: None ordered    Follow-Up: At [facility], you and your health needs are our priority.  As part of our continuing mission to provide you with exceptional heart care, we have created designated Provider Care Teams.  These Care Teams include your primary Cardiologist (physician) and Advanced Practice Providers (APPs -  Physician Assistants and Nurse Practitioners) who all work together to provide you with the care you need, when you need it.  We recommend signing up for the patient portal called "MyChart".  Sign up information is provided on this After Visit Summary.  MyChart is used to connect with patients for Virtual Visits (Telemedicine).  Patients are able to view lab/test results, encounter notes, upcoming appointments, etc.  Non-urgent messages can be sent to your provider as well.   To learn more about what you can do with MyChart, go to [email or web address].    Your next appointment:   6 month(s)  The format for your next appointment:   In Person  Provider:   You may see Dr. Gwyndolyn Kaufman or one of the following Advanced Practice Providers on your designated Care Team:    [name], PA-C  Robbie Lis, [place]    Other Instructions None

## 2021-02-15 NOTE — Telephone Encounter (Signed)
ERROR

## 2021-02-16 ENCOUNTER — Other Ambulatory Visit (HOSPITAL_COMMUNITY): Payer: Medicare (Managed Care)

## 2021-08-18 NOTE — Progress Notes (Signed)
Cardiology Office Note:    Date:  08/19/2021   ID:  Mandy Webb, DOB 1946/06/21, MRN [id number]  PCP:  Mandy Lei, MD   [facility] Providers Cardiologist:  Mandy Bergeron, MD     Referring MD: Mandy Lei, MD   Chief Complaint:  Follow-up on aortic stenosis    Patient Profile:    Mandy Webb is a 75 y.o. female with:  Aortic stenosis Moderate; Echocardiogram 1/22: EF 60-65, Gr 1 DD, mean 22.2 mmHg, VMax 308 cm/s, DI 0.26 Diabetes mellitus  Hypertension  Hyperlipidemia  Asthma  GERD   Prior CV studies: Echocardiogram 01/14/21 EF 60-65, no RWMA, GR 1 DD, GLS -21.5%, normal RVSF, mild-moderate MR, moderate aortic valve stenosis (mean 22.2, V-max 308 cm/s, DI 0.26)  Carotid US 02/10/16 Bilat ICA 1-39   History of Present Illness: Mandy Webb was evaluated by Mandy Webb in 3/22 for aortic stenosis. She returns for f/u.  She is here alone.  She walks with a walker.  She does have some shortness of breath but has not had any significant change.  She has not had chest pain, syncope, orthopnea or leg edema.  She did lose her hearing aid recently and has difficulty hearing.  She has only had coffee to drink today.        Past Medical History:  Diagnosis Date   Allergy    Anemia    Aortic stenosis    Arthritis    Asthma    Diabetes mellitus    GERD (gastroesophageal reflux disease)    Hyperlipidemia    Hypertension    Low blood pressure reading    Syncope     Current Medications: Current Meds  Medication Sig   albuterol (PROVENTIL HFA;VENTOLIN HFA) 108 (90 BASE) MCG/ACT inhaler Inhale 2 puffs into the lungs every 6 (six) hours as needed for wheezing (cough, shortness of breath or wheezing.).   ammonium lactate (LAC-HYDRIN) 12 % cream Apply topically as needed for dry skin.   atorvastatin (LIPITOR) 40 MG tablet Take 1 tablet (40 mg total) by mouth daily.   Blood Glucose Monitoring Suppl (BLOOD GLUCOSE METER) kit Use as instructed   Blood Pressure  KIT Please check you blood pressure after resting for five minutes.  Call [facility] if you pressure is consistently higher than 150/90.   brinzolamide (AZOPT) 1 % ophthalmic suspension Place 1 drop into both eyes 2 (two) times daily.    Cholecalciferol (VITAMIN D3) 1.25 MG (50000 UT) CAPS Take 1 capsule by mouth once a week.   FLOVENT HFA 220 MCG/ACT inhaler Inhale 1 puff into the lungs as needed (SOB or wheezing).   gabapentin (NEURONTIN) 600 MG tablet TAKE ONE TABLET BY MOUTH 4 TIMES DAILY   glucose blood (ONE TOUCH ULTRA TEST) test strip Use to test blood sugar daily. Dx code: E11.9   indapamide (LOZOL) 1.25 MG tablet    Lancets (ONETOUCH ULTRASOFT) lancets Test blood sugar daily. Dx code: E11.9   levothyroxine (SYNTHROID) 88 MCG tablet Take 88 mcg by mouth daily before breakfast.   linaclotide (LINZESS) 145 MCG CAPS capsule Take 1 capsule (145 mcg total) by mouth daily.   metFORMIN (GLUCOPHAGE) 500 MG tablet Take 500 mg by mouth at bedtime.   metoCLOPramide (REGLAN) 10 MG tablet TAKE 1 TABLET BY MOUTH ONCE DAILY **PATIENT  NEEDS  AN  APPOINTMENT  WITH  DR.  Ardis Webb  FOR  FUTHER  REFILLS**   montelukast (SINGULAIR) 10 MG tablet TAKE ONE TABLET BY MOUTH ONCE DAILY AT BEDTIME  omeprazole (PRILOSEC) 20 MG capsule TAKE ONE CAPSULE BY MOUTH ONCE DAILY   polyethylene glycol powder (GLYCOLAX/MIRALAX) powder TAKE 17 G BY MOUTH DAILY   predniSONE (DELTASONE) 2.5 MG tablet 2.5 mg daily with breakfast.   ramipril (ALTACE) 5 MG capsule TAKE ONE CAPSULE BY MOUTH ONCE DAILY   triamcinolone (KENALOG) 0.025 % cream Apply 1 application topically 2 (two) times daily as needed (for skin).   ZIOPTAN 0.0015 % SOLN INSTILL 1 DROP INTO EACH EYE ONCE DAILY     Allergies:   Protonix [pantoprazole sodium]   Social History   Tobacco Use   Smoking status: Former    Types: Cigarettes    Quit date: 02/02/1969    Years since quitting: 52.5   Smokeless tobacco: Never  Vaping Use   Vaping Use: Never used  Substance Use  Topics   Alcohol use: No    Alcohol/week: 0.0 standard drinks   Drug use: No     Family Hx: The patient's family history includes Cancer in her mother; Heart disease in her brother; Hypertension in her father; Stomach cancer in her mother. There is no history of Esophageal cancer, Colon polyps, or Rectal cancer.  Review of Systems  HENT:  Positive for hearing loss (she recently lost her hearing aid).     EKGs/Labs/Other Test Reviewed:    EKG:  EKG is not ordered today.  The ekg ordered today demonstrates N/A  Recent Labs: No results found for requested labs within last 8760 hours.   Recent Lipid Panel Lab Results  Component Value Date/Time   CHOL 192 12/16/2014 10:05 AM   TRIG 165 (H) 12/16/2014 10:05 AM   HDL 53 12/16/2014 10:05 AM   LDLCALC 106 (H) 12/16/2014 10:05 AM   LDLDIRECT 89 03/23/2015 04:20 PM      Risk Assessment/Calculations:          Physical Exam:    VS:  BP (!) 144/62   Pulse 72   Ht $R'5\' 1"'Nw$  (1.549 m)   Wt 125 lb (56.7 kg)   SpO2 92%   BMI 23.62 kg/m     Wt Readings from Last 3 Encounters:  08/19/21 125 lb (56.7 kg)  02/15/21 128 lb (58.1 kg)  05/23/19 160 lb (72.6 kg)     Constitutional:      Appearance: Healthy appearance. Not in distress.  Neck:     Vascular: No JVR.  Pulmonary:     Effort: Pulmonary effort is normal.     Breath sounds: No wheezing. No rales.  Cardiovascular:     Normal rate. Regular rhythm. Normal S1. Normal S2.      Murmurs: There is a grade 3/6 harsh crescendo-decrescendo systolic murmur at the URSB and ULSB.  Edema:    Peripheral edema absent.  Abdominal:     Palpations: Abdomen is soft.  Skin:    General: Skin is warm and dry.  Neurological:     Mental Status: Alert and oriented to person, place and time.     Cranial Nerves: Cranial nerves are intact.         ASSESSMENT & PLAN:    1. Moderate aortic stenosis No symptoms to suggest worsening aortic stenosis.  Plan f/u echocardiogram in Jan 2023 for 1  year f/u.  Arrange f/u with Mandy Webb after echocardiogram.    2. Essential hypertension BP high today.  It is usually well controlled on ramipril, indapamide.  Continue to monitor for now.   3. Mixed hyperlipidemia Atorvastatin dose increased to 40 mg  at last visit.  Obtain fasting CMET, Lipids today.            Dispo:  Return in about 4 months (around 12/19/2021) for Routine Follow Up, w/ Mandy Webb, or [name], PA-C.   Medication Adjustments/Labs and Tests Ordered: Current medicines are reviewed at length with the patient today.  Concerns regarding medicines are outlined above.  Tests Ordered: Orders Placed This Encounter  Procedures   Comp Met (CMET)   Lipid Profile   ECHOCARDIOGRAM COMPLETE    Medication Changes: No orders of the defined types were placed in this encounter.   Signed, [name], PA-C  08/19/2021 11:26 AM    [facility] [address], [place], [place]  [address] Phone: [phone]; Fax: [phone]

## 2021-08-19 ENCOUNTER — Telehealth: Payer: Self-pay | Admitting: Licensed Clinical Social Worker

## 2021-08-19 ENCOUNTER — Ambulatory Visit (INDEPENDENT_AMBULATORY_CARE_PROVIDER_SITE_OTHER): Payer: Medicare (Managed Care) | Admitting: Physician Assistant

## 2021-08-19 ENCOUNTER — Encounter: Payer: Self-pay | Admitting: Physician Assistant

## 2021-08-19 ENCOUNTER — Telehealth: Payer: Self-pay | Admitting: *Deleted

## 2021-08-19 ENCOUNTER — Other Ambulatory Visit: Payer: Self-pay

## 2021-08-19 VITALS — BP 144/62 | HR 72 | Ht 61.0 in | Wt 125.0 lb

## 2021-08-19 DIAGNOSIS — I10 Essential (primary) hypertension: Secondary | ICD-10-CM | POA: Diagnosis not present

## 2021-08-19 DIAGNOSIS — E119 Type 2 diabetes mellitus without complications: Secondary | ICD-10-CM

## 2021-08-19 DIAGNOSIS — I350 Nonrheumatic aortic (valve) stenosis: Secondary | ICD-10-CM | POA: Diagnosis not present

## 2021-08-19 DIAGNOSIS — E782 Mixed hyperlipidemia: Secondary | ICD-10-CM

## 2021-08-19 NOTE — Patient Instructions (Signed)
Medication Instructions:  Your physician recommends that you continue on your current medications as directed. Please refer to the Current Medication list given to you today.  *If you need a refill on your cardiac medications before your next appointment, please call your pharmacy*   Lab Work: TODAY!!!!CMET/LIPID  If you have labs (blood work) drawn today and your tests are completely normal, you will receive your results only by: [place] (if you have MyChart) OR A paper copy in the mail If you have any lab test that is abnormal or we need to change your treatment, we will call you to review the results.   Testing/Procedures: Your physician has requested that you have an echocardiogram. Echocardiography is a painless test that uses sound waves to create images of your heart. It provides your doctor with information about the size and shape of your heart and how well your heart's chambers and valves are working. This procedure takes approximately one hour. There are no restrictions for this procedure.    Follow-Up: At [facility], you and your health needs are our priority.  As part of our continuing mission to provide you with exceptional heart care, we have created designated Provider Care Teams.  These Care Teams include your primary Cardiologist (physician) and Advanced Practice Providers (APPs -  Physician Assistants and Nurse Practitioners) who all work together to provide you with the care you need, when you need it.  We recommend signing up for the patient portal called "MyChart".  Sign up information is provided on this After Visit Summary.  MyChart is used to connect with patients for Virtual Visits (Telemedicine).  Patients are able to view lab/test results, encounter notes, upcoming appointments, etc.  Non-urgent messages can be sent to your provider as well.   To learn more about what you can do with MyChart, go to [email or web address].    Your next appointment:    4 month(s)  The format for your next appointment:   In Person  Provider:   Gwyndolyn Kaufman, MD   Other Instructions

## 2021-08-19 NOTE — Telephone Encounter (Signed)
Pt came in today to See [facility] for appt.  When pt was ready to leave asked how pt got here.  Pt stated [facility] dropped her off. Called [facility] and pt does not use them.  Called [facility] explained situation, TY with HR s/w her supervisor and stated belonged to [place]. Pt came with no paperwork from [facility].  Asked HR if pt was supposed to have paperwork, this is usually the case, TY stated good question. Asked pt if she lived by herself, she stated yes and someone came in all week to help, on weekends pt is left alone.  Asked pt does she cook, stated she trys. Asked if she has children, she stated grown, Stated do you see them, once in a while. Concerned for pt's safety since pt seemed confused.  DPR in system but states only pt's name.   Will send to Raquel Sarna, LSW to review.

## 2021-08-19 NOTE — Telephone Encounter (Signed)
Received a call back from [place], [place] w/ [facility]. I introduced self, role, reason for call. I read her note from CMA this morning. She confirms pt was brought to the appt by [facility] transportation. She shares that usually paperwork would be sent directly to the office prior to appointment, there are some times that the driver would bring paperwork with them but not always.   Per Marcie Bal, pt daughter Hansel Starling had been living with pt but currently is not residing with her. She does provide intermittent assistance and is emergency contact. She has had some increased memory loss (MMSE in Oct 2021 was 20- moderate dementia) and this confusion was also likely exacerbated by her hearing loss; related to losing her hearing aid. They are working on obtaining a replacement for this.   Pt receives mobile meals and has food in the home. She receives the following care hours: - Monday: 7 hrs - Tuesday 6.5 hrs - Wednesday/Thursday: 2 hours - Friday: 6.5 hrs She also attends day center programming Wednesday, Thursday, Friday.  They are able to adjust this schedule as needed and pt has a team of physicians, social work, Astronomer and transportation through [employer]. If any additional questions/concerns are noted they can be communicated to [facility] at [phone]. She will send me appropriate documentation from their office and I will send that to [place] as well.   Westley Hummer, MSW, [place]  [phone]

## 2021-08-19 NOTE — Telephone Encounter (Signed)
LCSW team received referral to f/u on pt supports as there was some confusion from team when pt came to [facility] office today for visit. LCSW has reached out to [facility] office who directed me to social worker Marcie Bal who is assigned [facility] Education officer, museum. LCSW left message on her voicemail requesting a return call. LCSW will seek clarity regarding what supports pt has at home and if needed request that pt be evaluated for additional resources. Additional concern around supportive contact. LCSW noted that pt daughter is primary listed contact, [name]'s number is listed as preferred contact number and her email is also listed as MyChart account.  Will f/u with pt after speaking with [facility] SW.   Mandy Webb, MSW, [place]  [phone]

## 2021-08-20 LAB — COMPREHENSIVE METABOLIC PANEL
ALT: 12 IU/L (ref 0–32)
AST: 20 IU/L (ref 0–40)
Albumin/Globulin Ratio: 1.6 (ref 1.2–2.2)
Albumin: 4.3 g/dL (ref 3.7–4.7)
Alkaline Phosphatase: 59 IU/L (ref 44–121)
BUN/Creatinine Ratio: 13 (ref 12–28)
BUN: 10 mg/dL (ref 8–27)
Bilirubin Total: 0.6 mg/dL (ref 0.0–1.2)
CO2: 26 mmol/L (ref 20–29)
Calcium: 9.3 mg/dL (ref 8.7–10.3)
Chloride: 100 mmol/L (ref 96–106)
Creatinine, Ser: 0.76 mg/dL (ref 0.57–1.00)
Globulin, Total: 2.7 g/dL (ref 1.5–4.5)
Glucose: 103 mg/dL — ABNORMAL HIGH (ref 65–99)
Potassium: 4 mmol/L (ref 3.5–5.2)
Sodium: 142 mmol/L (ref 134–144)
Total Protein: 7 g/dL (ref 6.0–8.5)
eGFR: 81 mL/min/{1.73_m2} (ref >59)

## 2021-08-20 LAB — LIPID PANEL
Chol/HDL Ratio: 3 ratio (ref 0.0–4.4)
Cholesterol, Total: 193 mg/dL (ref 100–199)
HDL: 65 mg/dL (ref >39)
LDL Chol Calc (NIH): 104 mg/dL — ABNORMAL HIGH (ref 0–99)
Triglycerides: 135 mg/dL (ref 0–149)
VLDL Cholesterol Cal: 24 mg/dL (ref 5–40)

## 2021-08-26 ENCOUNTER — Other Ambulatory Visit: Payer: Self-pay | Admitting: *Deleted

## 2021-08-26 DIAGNOSIS — E782 Mixed hyperlipidemia: Secondary | ICD-10-CM

## 2021-11-01 ENCOUNTER — Telehealth: Payer: Self-pay | Admitting: *Deleted

## 2021-11-01 NOTE — Telephone Encounter (Signed)
-----   Message from [facility] sent at 10/28/2021  8:57 AM EST ----- Regarding: RE: needs appt with Johney Frame or APP this month--per [facility] Good morning,  I called the patient and spoke with her daughter, she did not see the point in scheduling with an APP at this time. She states that [facility] just needed her mother to schedule an ECHO which is scheduled for 11/09/21. She saw [name] in September and follows up with Dr. Johney Frame in January. She states she called [facility] a couple of days ago and never got a call back so she is going to try to call them again today to see if an appt with an APP is neccessary.  ----- Message ----- From: Nuala Alpha, LPN Sent: 95/08/3266   5:58 PM EST To: Nuala Alpha, LPN, Imagene Gurney, Trilby Drummer, # Subject: needs appt with Johney Frame or APP this month-#  [facility] faxed over a medical consultation/service request referral to our office on this pt.  They are asking that we call the pt and schedule her an appt with Dr. Johney Frame or an APP (most likely APP for she has nothing).  They recently saw pt and want her to see [place] for symptomatic moderate aortic stenosis.  Can you please call the pt tomorrow 11/11 and schedule her an appt with an APP or [place] at next available?  Send me the message with the date.  I will be off tomorrow.  Thank you ladies for always helping out with the schedule.  Thanks, [employer]

## 2021-11-02 ENCOUNTER — Ambulatory Visit: Payer: Medicare (Managed Care) | Admitting: Internal Medicine

## 2021-11-03 ENCOUNTER — Telehealth: Payer: Self-pay | Admitting: Cardiology

## 2021-11-03 NOTE — Telephone Encounter (Signed)
Called the pts daughter about appt to see Dr. Johney Frame. Pt will come into the office to see Dr. Johney Frame on next Monday 11/21 at 1030.  Daughter is aware to have her arrive 15 mins prior to that appt.  Pt is scheduled for an echo on 11/23, as requested by [facility], for symptomatic moderate aortic stenosis.  Daughter verbalized understanding and agrees with this plan. Daughter was more than gracious for all the assistance provided.

## 2021-11-03 NOTE — Telephone Encounter (Signed)
Patient came to our office on 10/27/21 saying they had an appt for that day. Ms Mandy Webb was brought by [facility] transportation and was later accompanied by her daughter. Ms Mandy Webb daughter said [facility] has an appt set up for her mom for fainting and fluctuating BP. I spoke to [facility] and told them we did not have an appt for that day and there was no documentation of receiving an order for an appt. I told her I could make an appt and get her scheduled next available to which they said ok. I then scheduled Ms Mandy Webb with Dr Mandy Webb on 11/02/21. When giving the daughter the appt reminder she said they were here to have an ECHO scheduled and not an OV so I moved up the ECHO and canceled the OV per her request. [facility] said they would send over the order for an OV to be made and was received late on 10/27/21. (Please see telephone note on 11/01/21) Patient's daughter still says an OV visit is not needed, just the ECHO and will follow up with [facility] to see if a follow up is needed.

## 2021-11-04 NOTE — Progress Notes (Signed)
Cardiology Office Note:    Date:  11/07/2021   ID:  Mandy Webb, DOB 07/09/1946, MRN [id number]  PCP:  Lucianne Lei, MD   [place]  Cardiologist:  Freada Bergeron, MD  Advanced Practice Provider:  No care team member to display Electrophysiologist:  None    Referring MD: Lucianne Lei, MD    History of Present Illness:    Mandy Webb is a 75 y.o. female with a hx of DMII, HTN, HLD, and moderate aortic stenosis who presents to clinic for follow-up of her moderate aortic stenosis.  Last seen by [name], PA-C on 08/19/21 where she had mild dyspnea on exertion but no chest pain, syncope or HF symptoms. TTE 01/14/21 with LVEF 60-65%, moderate aortic stenosis with mean gradient 22.$RemoveBeforeDE'2mg'[id number]$  and peak 3.29m/s.  She returns to clinic today given due to episode of presyncope This occurred about 2 weeks ago when she was walking at [facility] and began to felt very lightheaded. BP was "low" at that point. No associated chest pain, SOB, palpitations. They did orthostatics which were reportedly negative. Had one other prior episode of syncope a year ago while in [place]. Thought it may have been due to low blood pressure, however, was also told she may need her valve replaced. Follow-up here revealed moderate AS as detailed above and intervention was deferred.   Today, the patient denies any chest pain, SOB, lightheadedness or dizziness. Notably, her antihypertensives were stopped and her blood pressure is 170 today. She overall states she feels well. She is scheduled for TTE on Wednesday.   Past Medical History:  Diagnosis Date   Allergy    Anemia    Aortic stenosis    Arthritis    Asthma    Diabetes mellitus    GERD (gastroesophageal reflux disease)    Hyperlipidemia    Hypertension    Low blood pressure reading    Syncope     Past Surgical History:  Procedure Laterality Date   APPENDECTOMY     COLONOSCOPY     PITUITARY SURGERY     TUBAL LIGATION       Current Medications: Current Meds  Medication Sig   albuterol (PROVENTIL HFA;VENTOLIN HFA) 108 (90 BASE) MCG/ACT inhaler Inhale 2 puffs into the lungs every 6 (six) hours as needed for wheezing (cough, shortness of breath or wheezing.).   atorvastatin (LIPITOR) 40 MG tablet Take 1 tablet (40 mg total) by mouth daily.   Blood Pressure KIT Please check you blood pressure after resting for five minutes.  Call [facility] if you pressure is consistently higher than 150/90.   brinzolamide (AZOPT) 1 % ophthalmic suspension Place 1 drop into both eyes 2 (two) times daily.    Cholecalciferol (VITAMIN D3) 1.25 MG (50000 UT) CAPS Take 1 capsule by mouth once a week.   FLOVENT HFA 220 MCG/ACT inhaler Inhale 1 puff into the lungs as needed (SOB or wheezing).   indapamide (LOZOL) 1.25 MG tablet    levothyroxine (SYNTHROID) 88 MCG tablet Take 88 mcg by mouth daily before breakfast.   montelukast (SINGULAIR) 10 MG tablet TAKE ONE TABLET BY MOUTH ONCE DAILY AT BEDTIME   omeprazole (PRILOSEC) 20 MG capsule TAKE ONE CAPSULE BY MOUTH ONCE DAILY   polyethylene glycol powder (GLYCOLAX/MIRALAX) powder TAKE 17 G BY MOUTH DAILY   ramipril (ALTACE) 5 MG capsule TAKE ONE CAPSULE BY MOUTH ONCE DAILY   ZIOPTAN 0.0015 % SOLN INSTILL 1 DROP INTO EACH EYE ONCE DAILY   [DISCONTINUED] losartan (  COZAAR) 25 MG tablet Take 1 tablet (25 mg total) by mouth daily.   [DISCONTINUED] metoprolol tartrate (LOPRESSOR) 100 MG tablet Take 1 tablet (100 mg total) by mouth once for 1 dose. Take 90-120 minutes prior to scan.     Allergies:   Protonix [pantoprazole sodium]   Social History   Socioeconomic History   Marital status: Single    Spouse name: Not on file   Number of children: 3   Years of education: Not on file   Highest education level: Not on file  Occupational History   Occupation: Retired    Fish farm manager: UNEMPLOYED  Tobacco Use   Smoking status: Former    Types: Cigarettes    Quit date: 02/02/1969    Years since  quitting: 52.7   Smokeless tobacco: Never  Vaping Use   Vaping Use: Never used  Substance and Sexual Activity   Alcohol use: No    Alcohol/week: 0.0 standard drinks   Drug use: No   Sexual activity: Never  Other Topics Concern   Not on file  Social History Narrative   Lives alone;   1 daughter lives in [place]   1 son lives in [place] Strain: Not on file  Food Insecurity: Not on file  Transportation Needs: Not on file  Physical Activity: Not on file  Stress: Not on file  Social Connections: Not on file     Family History: The patient's family history includes Cancer in her mother; Heart disease in her brother; Hypertension in her father; Stomach cancer in her mother. There is no history of Esophageal cancer, Colon polyps, or Rectal cancer.  ROS:   Please see the history of present illness.    Review of Systems  Constitutional:  Negative for chills and fever.  HENT:  Negative for sore throat.   Eyes:  Negative for blurred vision and redness.  Respiratory:  Negative for shortness of breath.   Cardiovascular:  Negative for chest pain, palpitations, orthopnea, claudication, leg swelling and PND.  Gastrointestinal:  Negative for melena, nausea and vomiting.  Genitourinary:  Negative for dysuria.  Musculoskeletal:  Positive for joint pain and myalgias.  Neurological:  Positive for dizziness. Negative for loss of consciousness.  Endo/Heme/Allergies:  Negative for polydipsia.  Psychiatric/Behavioral:  Negative for substance abuse.    EKGs/Labs/Other Studies Reviewed:    The following studies were reviewed today: TTE 2021/01/16: IMPRESSIONS   1. Moderate aortic valve stenosis.   2. Left ventricular ejection fraction, by estimation, is 60 to 65%. The  left ventricle has normal function. The left ventricle has no regional  wall motion abnormalities. Left ventricular diastolic parameters are  consistent with Grade I  diastolic  dysfunction (impaired relaxation). The average left ventricular global  longitudinal strain is -21.5 %. The global longitudinal strain is normal.   3. Right ventricular systolic function is normal. The right ventricular  size is normal. There is normal pulmonary artery systolic pressure.   4. The mitral valve is normal in structure. Mild to moderate mitral valve  regurgitation. No evidence of mitral stenosis.   5. The aortic valve is calcified. There is moderate calcification of the  aortic valve. There is moderate thickening of the aortic valve. Aortic  valve regurgitation is not visualized. Moderate aortic valve stenosis.  Aortic valve mean gradient measures  22.2 mmHg. Aortic valve Vmax measures 3.08 m/s.   6. The inferior vena cava is normal in size with greater than 50%  respiratory variability, suggesting right atrial pressure of 3 mmHg.   EKG:  EKG is  ordered today.  The ekg ordered today demonstrates NSR with TWI inferolaterally, HR 69  Recent Labs: 08/19/2021: ALT 12; BUN 10; Creatinine, Ser 0.76; Potassium 4.0; Sodium 142  Recent Lipid Panel    Component Value Date/Time   CHOL 193 08/19/2021 1108   TRIG 135 08/19/2021 1108   HDL 65 08/19/2021 1108   CHOLHDL 3.0 08/19/2021 1108   CHOLHDL 3.6 12/16/2014 1005   VLDL 33 12/16/2014 1005   LDLCALC 104 (H) 08/19/2021 1108   LDLDIRECT 89 03/23/2015 1620     Physical Exam:    VS:  BP (!) 178/98   Pulse 69   Ht 5\' 1"  (1.549 m)   Wt 122 lb 12.8 oz (55.7 kg)   SpO2 94%   BMI 23.20 kg/m     Wt Readings from Last 3 Encounters:  11/07/21 122 lb 12.8 oz (55.7 kg)  08/19/21 125 lb (56.7 kg)  02/15/21 128 lb (58.1 kg)     GEN:  Comfortable, NAD HEENT: Normal NECK: No JVD; No carotid bruits CARDIAC: RRR, 3/6 harsh nearly holosytolic murmur that radiates to the carotids. No rubs, gallops RESPIRATORY:  CTAB ABDOMEN: Soft, non-tender, non-distended MUSCULOSKELETAL:  No edema; No deformity  SKIN: Warm and  dry NEUROLOGIC:  Alert and oriented x 3 PSYCHIATRIC:  Normal affect   ASSESSMENT:    1. Syncope and collapse   2. Moderate aortic stenosis   3. Coronary artery disease involving native heart with angina pectoris, unspecified vessel or lesion type ([place])   4. Essential hypertension   5. Mixed hyperlipidemia   6. Type 2 diabetes mellitus without complication, with long-term current use of insulin ([place])   7. Abnormal ECG    PLAN:    In order of problems listed above:  #Syncope/Presyncope: Patient with intermittent episodes of dizziness and two episodes of pre-syncope/syncope over the past year. TTE 01/14/21 with LVEF 55-60%. Moderate aortic valve stenosis withmean gradient measures 22.2 mmHg. Vmax.08 m/s. Per report, blood pressure was low during these episodes and therefore she has been weaned off her antihypertensives. Given underlying AS, however, concern that her symptoms could represent progression of disease. Will repeat TTE. -Plan for TTE this Wednesday -Will check coronary CTA with TAVR protocol to assess calcium score of the valve to help determine severity of disease. Also to assess coronary arteries given ECG changes as below -Continue hydration -Will start low dose losartan 25mg  daily given SBP 170 today  #TWI inferolaterally: New TWI on ECG today. Patient is asymptomatic with no chest pain, SOB, lightheadedness or dizziness. ? Due to hypertension. Will need ischemic evaluation.  -Check trops today; patient is denying symptoms currently -Shared decision making about checking coronary CTA to evaluate both the valve and coronaries    #Moderate AS: TTE 01/14/21 with LVEF 55-60%. Moderate aortic valve stenosis with mean gradient measures 22.2 mmHg. Vmax.08 m/s. Given presyncopal episodes, will repeat TTE as above to assess for interval progression of AS. Will also check CTA to assess valve further. -Check TTE -Coronary CTA as above to evaluate both aortic valve and  coronaries  #HTN: Elevated today.  -Start low dose losartan 25mg  daily -BMET next week  #HLD: -Continue atorvastatin 40mg  daily  #DMII: -Continue insulin and metformin   Medication Adjustments/Labs and Tests Ordered: Current medicines are reviewed at length with the patient today.  Concerns regarding medicines are outlined above.  Orders Placed This Encounter  Procedures   CT  CORONARY MORPH W/CTA COR W/SCORE W/CA W/CM &/OR WO/CM   Troponin T   Basic metabolic panel   EKG 40-HKVQ   Meds ordered this encounter  Medications   DISCONTD: metoprolol tartrate (LOPRESSOR) 100 MG tablet    Sig: Take 1 tablet (100 mg total) by mouth once for 1 dose. Take 90-120 minutes prior to scan.    Dispense:  1 tablet    Refill:  0   DISCONTD: losartan (COZAAR) 25 MG tablet    Sig: Take 1 tablet (25 mg total) by mouth daily.    Dispense:  90 tablet    Refill:  2   metoprolol tartrate (LOPRESSOR) 100 MG tablet    Sig: Take 1 tablet (100 mg total) by mouth once for 1 dose. Take 90-120 minutes prior to scan.    Dispense:  1 tablet    Refill:  0   losartan (COZAAR) 25 MG tablet    Sig: Take 1 tablet (25 mg total) by mouth daily.    Dispense:  90 tablet    Refill:  2    Patient Instructions  Medication Instructions:   START TAKING LOSARTAN 25 MG BY MOUTH DAILY  *If you need a refill on your cardiac medications before your next appointment, please call your pharmacy*   Lab Work:  TODAY--TROPONIN T  IN ONE WEEK--CHECK BMET  If you have labs (blood work) drawn today and your tests are completely normal, you will receive your results only by: [place] (if you have MyChart) OR A paper copy in the mail If you have any lab test that is abnormal or we need to change your treatment, we will call you to review the results.   Testing/Procedures:    Your cardiac CT will be scheduled at one of the below locations:   [facility] [address] [place], [place]  [address] [phone]   If scheduled at [facility], please arrive at the [facility] main entrance (entrance A) of [facility] 30 minutes prior to test start time. You can use the FREE valet parking offered at the main entrance (encouraged to control the heart rate for the test) Proceed to the [facility] Radiology Department (first floor) to check-in and test prep.  Please follow these instructions carefully (unless otherwise directed):   On the Night Before the Test: Be sure to Drink plenty of water. Do not consume any caffeinated/decaffeinated beverages or chocolate 12 hours prior to your test. Do not take any antihistamines 12 hours prior to your test.   On the Day of the Test: Drink plenty of water until 1 hour prior to the test. Do not eat any food 4 hours prior to the test. You may take your regular medications prior to the test.  Take metoprolol 100 MG BY MOUTH (Lopressor) two hours prior to test. HOLD LOZOL morning of the test. FEMALES- please wear underwire-free bra if available, avoid dresses & tight clothing  After the Test: Drink plenty of water. After receiving IV contrast, you may experience a mild flushed feeling. This is normal. On occasion, you may experience a mild rash up to 24 hours after the test. This is not dangerous. If this occurs, you can take Benadryl 25 mg and increase your fluid intake. If you experience trouble breathing, this can be serious. If it is severe call 911 IMMEDIATELY. If it is mild, please call our office. If you take any of these medications: Glipizide/Metformin, Avandament, Glucavance, please do not  take 48 hours after completing test unless otherwise instructed.  Please allow 2-4 weeks for scheduling of routine cardiac CTs. Some insurance companies require a pre-authorization which may delay scheduling of this test.   For non-scheduling related questions, please contact the cardiac imaging nurse navigator should you have  any questions/concerns: Marchia Bond, Cardiac Imaging Nurse Navigator Gordy Clement, Cardiac Imaging Nurse Navigator [facility] Direct Office Dial: [phone]   For scheduling needs, including cancellations and rescheduling, please call [place], [phone].    Follow-Up:  2-3 MONTHS WITH AN EXTENDER IN THE OFFICE    Signed, Freada Bergeron, MD  11/07/2021 12:37 PM    [place]

## 2021-11-07 ENCOUNTER — Encounter: Payer: Self-pay | Admitting: Cardiology

## 2021-11-07 ENCOUNTER — Telehealth: Payer: Self-pay | Admitting: *Deleted

## 2021-11-07 ENCOUNTER — Other Ambulatory Visit: Payer: Self-pay

## 2021-11-07 ENCOUNTER — Ambulatory Visit (INDEPENDENT_AMBULATORY_CARE_PROVIDER_SITE_OTHER): Payer: Medicare (Managed Care) | Admitting: Cardiology

## 2021-11-07 VITALS — BP 178/98 | HR 69 | Ht 61.0 in | Wt 122.8 lb

## 2021-11-07 DIAGNOSIS — I10 Essential (primary) hypertension: Secondary | ICD-10-CM | POA: Diagnosis not present

## 2021-11-07 DIAGNOSIS — R55 Syncope and collapse: Secondary | ICD-10-CM

## 2021-11-07 DIAGNOSIS — I25119 Atherosclerotic heart disease of native coronary artery with unspecified angina pectoris: Secondary | ICD-10-CM

## 2021-11-07 DIAGNOSIS — I350 Nonrheumatic aortic (valve) stenosis: Secondary | ICD-10-CM | POA: Diagnosis not present

## 2021-11-07 DIAGNOSIS — Z794 Long term (current) use of insulin: Secondary | ICD-10-CM

## 2021-11-07 DIAGNOSIS — E782 Mixed hyperlipidemia: Secondary | ICD-10-CM

## 2021-11-07 DIAGNOSIS — R9431 Abnormal electrocardiogram [ECG] [EKG]: Secondary | ICD-10-CM

## 2021-11-07 DIAGNOSIS — E119 Type 2 diabetes mellitus without complications: Secondary | ICD-10-CM

## 2021-11-07 LAB — TROPONIN T: Troponin T (Highly Sensitive): 11 ng/L (ref 0–14)

## 2021-11-07 MED ORDER — LOSARTAN POTASSIUM 25 MG PO TABS
25.0000 mg | ORAL_TABLET | Freq: Every day | ORAL | 2 refills | Status: DC
Start: 2021-11-07 — End: 2021-11-07

## 2021-11-07 MED ORDER — METOPROLOL TARTRATE 100 MG PO TABS: 100.0000 mg | ORAL_TABLET | Freq: Once | ORAL | 0 refills | Status: DC

## 2021-11-07 MED ORDER — LOSARTAN POTASSIUM 25 MG PO TABS: 25.0000 mg | ORAL_TABLET | Freq: Every day | ORAL | 2 refills | Status: AC

## 2021-11-07 NOTE — Telephone Encounter (Signed)
-----   Message from Freada Bergeron, MD sent at 11/07/2021 12:52 PM EST ----- Regarding: RE: CARDIAC CT FOR CAD AND AORTIC STENOSIS DO RETROSPECTIVE PER [place],  I am so sorry for this order as I am sure a 75 year old with AS probably has calcified arteries and the readers will be less than pleased with me.  Running like a TAVR protocol sounds good to me. I am not sure if you guys routinely give nitro and BB for the TAVRs but in this case we would want to as we do want to see if she has coronary disease (if possible). She had syncope and has an ugly ECG so I am trying to figure out if valve is progressing (how calcified is it) and if she has bad coronaries too.   Thank you so much and again, sorry for the headache!  -Nira Conn ----- Message ----- From: Lorenza Evangelist, RN Sent: 11/07/2021  11:55 AM EST To: Nuala Alpha, LPN, Melony Overly, # Subject: RE: CARDIAC CT FOR CAD AND AORTIC STENOSIS D#  Should we go ahead and scan like a TAVR? Clarise Cruz  ----- Message ----- From: Nuala Alpha, LPN Sent: [id number]/17/9150  11:36 AM EST To: Nuala Alpha, LPN, Lorenza Evangelist, RN, # Subject: CARDIAC CT FOR CAD AND AORTIC STENOSIS DO RE#  Dr. Johney Frame just saw his pt in clinic.  She spoke with Johnsie Cancel and he recommended that she order for his pt to have a Cardiac CT for both CAD and aortic stenosis do with Retrospective.  I placed the regular Cardiac CT with do for chest pain eval and placed in ordering comments to do with Retrospective.  Let me know if this is the right order/protocol to do or if I need to change CT order around.  Can you please schedule and let me know the date?  Thanks, [employer]

## 2021-11-07 NOTE — Patient Instructions (Signed)
Medication Instructions:   START TAKING LOSARTAN 25 MG BY MOUTH DAILY  *If you need a refill on your cardiac medications before your next appointment, please call your pharmacy*   Lab Work:  TODAY--TROPONIN T  IN ONE WEEK--CHECK BMET  If you have labs (blood work) drawn today and your tests are completely normal, you will receive your results only by: [place] (if you have MyChart) OR A paper copy in the mail If you have any lab test that is abnormal or we need to change your treatment, we will call you to review the results.   Testing/Procedures:    Your cardiac CT will be scheduled at one of the below locations:   [facility] [address] [place], [place] [address] [phone]   If scheduled at [facility], please arrive at the [facility] main entrance (entrance A) of [facility] 30 minutes prior to test start time. You can use the FREE valet parking offered at the main entrance (encouraged to control the heart rate for the test) Proceed to the [facility] Radiology Department (first floor) to check-in and test prep.  Please follow these instructions carefully (unless otherwise directed):   On the Night Before the Test: Be sure to Drink plenty of water. Do not consume any caffeinated/decaffeinated beverages or chocolate 12 hours prior to your test. Do not take any antihistamines 12 hours prior to your test.   On the Day of the Test: Drink plenty of water until 1 hour prior to the test. Do not eat any food 4 hours prior to the test. You may take your regular medications prior to the test.  Take metoprolol 100 MG BY MOUTH (Lopressor) two hours prior to test. HOLD LOZOL morning of the test. FEMALES- please wear underwire-free bra if available, avoid dresses & tight clothing  After the Test: Drink plenty of water. After receiving IV contrast, you may experience a mild flushed feeling. This is normal. On occasion, you may  experience a mild rash up to 24 hours after the test. This is not dangerous. If this occurs, you can take Benadryl 25 mg and increase your fluid intake. If you experience trouble breathing, this can be serious. If it is severe call 911 IMMEDIATELY. If it is mild, please call our office. If you take any of these medications: Glipizide/Metformin, Avandament, Glucavance, please do not take 48 hours after completing test unless otherwise instructed.  Please allow 2-4 weeks for scheduling of routine cardiac CTs. Some insurance companies require a pre-authorization which may delay scheduling of this test.   For non-scheduling related questions, please contact the cardiac imaging nurse navigator should you have any questions/concerns: Marchia Bond, Cardiac Imaging Nurse Navigator Gordy Clement, Cardiac Imaging Nurse Navigator [place] [facility] Direct Office Dial: [phone]   For scheduling needs, including cancellations and rescheduling, please call [place], [phone].    Follow-Up:  2-3 MONTHS WITH AN EXTENDER IN THE OFFICE

## 2021-11-09 ENCOUNTER — Encounter: Payer: Self-pay | Admitting: Physician Assistant

## 2021-11-09 ENCOUNTER — Other Ambulatory Visit: Payer: Self-pay

## 2021-11-09 ENCOUNTER — Ambulatory Visit (HOSPITAL_COMMUNITY)
Admission: RE | Admit: 2021-11-09 | Discharge: 2021-11-09 | Disposition: A | Discharge status: Home / Self Care | Payer: Medicare (Managed Care) | Source: Ambulatory Visit | Attending: Physician Assistant | Admitting: Physician Assistant

## 2021-11-09 DIAGNOSIS — I080 Rheumatic disorders of both mitral and aortic valves: Secondary | ICD-10-CM | POA: Insufficient documentation

## 2021-11-09 DIAGNOSIS — I10 Essential (primary) hypertension: Secondary | ICD-10-CM | POA: Diagnosis not present

## 2021-11-09 DIAGNOSIS — E119 Type 2 diabetes mellitus without complications: Secondary | ICD-10-CM | POA: Diagnosis not present

## 2021-11-09 DIAGNOSIS — I350 Nonrheumatic aortic (valve) stenosis: Secondary | ICD-10-CM

## 2021-11-09 DIAGNOSIS — E782 Mixed hyperlipidemia: Secondary | ICD-10-CM | POA: Insufficient documentation

## 2021-11-09 LAB — ECHOCARDIOGRAM COMPLETE
AR max vel: 0.6 cm2
AV Area VTI: 0.65 cm2
AV Area mean vel: 0.58 cm2
AV Mean grad: 23.3 mmHg
AV Peak grad: 40.6 mmHg
Ao pk vel: 3.19 m/s
Area-P 1/2: 2.87 cm2
S' Lateral: 2 cm

## 2021-11-15 ENCOUNTER — Other Ambulatory Visit: Payer: Self-pay

## 2021-11-15 ENCOUNTER — Telehealth: Payer: Self-pay | Admitting: *Deleted

## 2021-11-15 ENCOUNTER — Other Ambulatory Visit: Payer: Medicare (Managed Care) | Admitting: *Deleted

## 2021-11-15 DIAGNOSIS — I350 Nonrheumatic aortic (valve) stenosis: Secondary | ICD-10-CM

## 2021-11-15 DIAGNOSIS — I10 Essential (primary) hypertension: Secondary | ICD-10-CM

## 2021-11-15 DIAGNOSIS — I25119 Atherosclerotic heart disease of native coronary artery with unspecified angina pectoris: Secondary | ICD-10-CM

## 2021-11-15 LAB — BASIC METABOLIC PANEL
BUN/Creatinine Ratio: 21 (ref 12–28)
BUN: 16 mg/dL (ref 8–27)
CO2: 31 mmol/L — ABNORMAL HIGH (ref 20–29)
Calcium: 9.1 mg/dL (ref 8.7–10.3)
Chloride: 97 mmol/L (ref 96–106)
Creatinine, Ser: 0.76 mg/dL (ref 0.57–1.00)
Glucose: 134 mg/dL — ABNORMAL HIGH (ref 70–99)
Potassium: 4.2 mmol/L (ref 3.5–5.2)
Sodium: 140 mmol/L (ref 134–144)
eGFR: 81 mL/min/{1.73_m2} (ref >59)

## 2021-11-15 NOTE — Telephone Encounter (Signed)
Spoke with the pts daughter Hansel Starling (on [place] and handles/takes pt to all appointments) and endorsed to her the pts echo results and plan per Dr. Johney Frame.  Informed the pts daughter that I will place the referral to our Structural Heart/Valve clinic in the system, and send our Structural Nurse Navigators a message to call her back and arrange this consult appt.  Daughter verbalized understanding and agrees with this plan.  Advised the pts daughter to keep all other treatment plans/test/follow-up as previously scheduled at last OV with Dr. Johney Frame. Daughter agreed with this plan.

## 2021-11-15 NOTE — Telephone Encounter (Signed)
-----   Message from Freada Bergeron, MD sent at 11/10/2021  6:13 AM EST ----- Oh my gosh this came full circle. We already ordered her CTA as I was concerned that her valve looked terrible and then I read her echo and it is in fact bad. We will let her know and send her to [facility].  Karlene Einstein, can we get her in with the structural team? ----- Message ----- From: Sharmon Revere Sent: 11/09/2021   4:29 PM EST To: Nuala Alpha, LPN, Freada Bergeron, MD  Echocardiogram shows that aortic stenosis appears worse.  EF is normal.   Will review with Dr. Johney Frame. PLAN:  -Continue current medications and f/u as planned.  [name], PA-C    11/09/2021 4:23 PM

## 2021-11-16 ENCOUNTER — Other Ambulatory Visit: Payer: Self-pay

## 2021-11-16 ENCOUNTER — Other Ambulatory Visit: Payer: Medicare (Managed Care)

## 2021-11-16 ENCOUNTER — Telehealth: Payer: Self-pay

## 2021-11-16 MED ORDER — SIMBRINZA 1-0.2 % OP SUSP: 1.0000 [drp] | Freq: Three times a day (TID) | OPHTHALMIC | Status: AC

## 2021-11-16 MED ORDER — LATANOPROST 0.005 % OP SOLN: 1.0000 [drp] | Freq: Every day | OPHTHALMIC | 12 refills | Status: DC

## 2021-11-16 MED ORDER — LEVOTHYROXINE SODIUM 100 MCG PO TABS: 100.0000 ug | ORAL_TABLET | Freq: Every day | ORAL | Status: DC

## 2021-11-16 NOTE — Telephone Encounter (Signed)
I contacted the pt's daughter and made her aware of instructions for CT scan on 12/2.  I also scheduled the pt for TAVR consult on 11/23/21 with Dr Burt Knack.  The pt's daughter asked that I update the pt's medication list to show what she is currently taking.  Medication list reconciled.

## 2021-11-18 ENCOUNTER — Other Ambulatory Visit: Payer: Self-pay

## 2021-11-18 ENCOUNTER — Ambulatory Visit (HOSPITAL_COMMUNITY)
Admission: RE | Admit: 2021-11-18 | Discharge: 2021-11-18 | Disposition: A | Discharge status: Home / Self Care | Payer: Medicare (Managed Care) | Source: Ambulatory Visit | Attending: Cardiology | Admitting: Cardiology

## 2021-11-18 DIAGNOSIS — I350 Nonrheumatic aortic (valve) stenosis: Secondary | ICD-10-CM | POA: Insufficient documentation

## 2021-11-18 DIAGNOSIS — I10 Essential (primary) hypertension: Secondary | ICD-10-CM | POA: Diagnosis present

## 2021-11-18 DIAGNOSIS — I25119 Atherosclerotic heart disease of native coronary artery with unspecified angina pectoris: Secondary | ICD-10-CM | POA: Insufficient documentation

## 2021-11-18 MED ORDER — IOHEXOL 350 MG/ML SOLN
100.0000 mL | Freq: Once | INTRAVENOUS | Status: AC | PRN
  Administered 2021-11-18: 100 mL via INTRAVENOUS

## 2021-11-23 ENCOUNTER — Other Ambulatory Visit: Payer: Self-pay

## 2021-11-23 ENCOUNTER — Ambulatory Visit (INDEPENDENT_AMBULATORY_CARE_PROVIDER_SITE_OTHER): Payer: Medicare (Managed Care) | Admitting: Cardiovascular Disease

## 2021-11-23 ENCOUNTER — Encounter: Payer: Self-pay | Admitting: Cardiovascular Disease

## 2021-11-23 VITALS — BP 140/80 | HR 71 | Ht 60.0 in | Wt 122.0 lb

## 2021-11-23 DIAGNOSIS — I350 Nonrheumatic aortic (valve) stenosis: Secondary | ICD-10-CM

## 2021-11-23 LAB — CBC
Hematocrit: 41.7 % (ref 34.0–46.6)
Hemoglobin: 14.1 g/dL (ref 11.1–15.9)
MCH: 27.3 pg (ref 26.6–33.0)
MCHC: 33.8 g/dL (ref 31.5–35.7)
MCV: 81 fL (ref 79–97)
Platelets: 185 10*3/uL (ref 150–450)
RBC: 5.17 x10E6/uL (ref 3.77–5.28)
RDW: 15.1 % (ref 11.7–15.4)
WBC: 3.9 10*3/uL (ref 3.4–10.8)

## 2021-11-23 NOTE — Progress Notes (Signed)
Pre Surgical Assessment: 5 M Walk Test  1M=16.55ft  Stood patient up to attempt 5 meter walk test and patient complained of dizziness. Unable to complete any trials.   5 Meter Walk Test- trial 1:  5 Meter Walk Test- trial 2: 5 Meter Walk Test- trial 3:  5 Meter Walk Test Average:

## 2021-11-23 NOTE — H&P (View-Only) (Signed)
Cardiology Office Note:    Date:  11/27/2021   ID:  Mandy Webb, DOB 1946/11/19, MRN [id number]  PCP:  [name], MD   [facility] Providers Cardiologist:  Freada Bergeron, MD     Referring MD: Lucianne Lei, MD   Chief Complaint  Patient presents with   Shortness of Breath     History of Present Illness:    Mandy Webb is a 75 y.o. female with a hx of: Aortic stenosis Moderate; Echocardiogram 1/22: EF 60-65, Gr 1 DD, mean 22.2 mmHg, VMax 308 cm/s, DI 0.26 Severe; echocardiogram 11/22, suggests severe stage D3 paradoxical low-flow low gradient aortic stenosis with mean gradient 27 mmHg, dimensionless index 0.17, and stroke-volume index 32. Diabetes mellitus  Hypertension  Hyperlipidemia  Asthma  GERD   The patient is here with her daughter today.  She is able to function independently, but she has developed mild dementia.  She is interested in pursuing any treatment options that would help her quality of life.  She complains of increasing fatigue and shortness of breath with activity over the past 6 months.  She is short of breath with any moderate level activities like walking outside. She has not had chest pain or pressure, orthopnea, PND, or leg swelling.  She has had some episodes of lightheadedness and presyncope associated with low blood pressure readings.   Past Medical History:  Diagnosis Date   Allergy    Anemia    Aortic stenosis    Echo 11/22: EF 60-65, no RWMA, mild asymmetric LVH, GR 1 DD, probable severe paradoxical aortic stenosis (mean gradient 27 mmHg, V-max 340 cm/s, DI 0.17), normal RVSF, RVSP 24.9, trivial MR   Arthritis    Asthma    Diabetes mellitus    GERD (gastroesophageal reflux disease)    Hyperlipidemia    Hypertension    Low blood pressure reading    Syncope     Past Surgical History:  Procedure Laterality Date   APPENDECTOMY     COLONOSCOPY     PITUITARY SURGERY     TUBAL LIGATION      Current  Medications: Current Meds  Medication Sig   albuterol (PROVENTIL HFA;VENTOLIN HFA) 108 (90 BASE) MCG/ACT inhaler Inhale 2 puffs into the lungs every 6 (six) hours as needed for wheezing (cough, shortness of breath or wheezing.).   Alpha-Lipoic Acid 600 MG CAPS Take 600 mg by mouth daily.   Brinzolamide-Brimonidine (SIMBRINZA) 1-0.2 % SUSP Apply 1 drop to eye in the morning, at noon, and at bedtime.   donepezil (ARICEPT) 10 MG tablet Take 10 mg by mouth at bedtime.   famotidine (PEPCID) 10 MG tablet Take 10 mg by mouth 2 (two) times daily.   levothyroxine (SYNTHROID) 100 MCG tablet Take 1 tablet (100 mcg total) by mouth daily before breakfast.   losartan (COZAAR) 25 MG tablet Take 1 tablet (25 mg total) by mouth daily.   Multiple Vitamins-Minerals (PRESERVISION AREDS 2) CHEW Chew 1 tablet by mouth daily.   polyethylene glycol powder (GLYCOLAX/MIRALAX) powder TAKE 17 G BY MOUTH DAILY   ROCKLATAN 0.02-0.005 % SOLN Apply 1 drop to eye at bedtime.   rosuvastatin (CRESTOR) 40 MG tablet Take 40 mg by mouth daily.   timolol (TIMOPTIC) 0.5 % ophthalmic solution Place 1 drop into both eyes every morning.   [DISCONTINUED] docusate sodium (COLACE) 50 MG capsule Take 50 mg by mouth 2 (two) times daily.   [DISCONTINUED] indapamide (LOZOL) 1.25 MG tablet      Allergies:  Protonix [pantoprazole sodium]   Social History   Socioeconomic History   Marital status: Single    Spouse name: Not on file   Number of children: 3   Years of education: Not on file   Highest education level: Not on file  Occupational History   Occupation: Retired    Fish farm manager: UNEMPLOYED  Tobacco Use   Smoking status: Former    Types: Cigarettes    Quit date: 02/02/1969    Years since quitting: 52.8   Smokeless tobacco: Never  Vaping Use   Vaping Use: Never used  Substance and Sexual Activity   Alcohol use: No    Alcohol/week: 0.0 standard drinks   Drug use: No   Sexual activity: Never  Other Topics Concern   Not on  file  Social History Narrative   Lives alone;   1 daughter lives in [place]   1 son lives in [place] Strain: Not on file  Food Insecurity: Not on file  Transportation Needs: Not on file  Physical Activity: Not on file  Stress: Not on file  Social Connections: Not on file     Family History: The patient's family history includes Cancer in her mother; Heart disease in her brother; Hypertension in her father; Stomach cancer in her mother. There is no history of Esophageal cancer, Colon polyps, or Rectal cancer.  ROS:   Please see the history of present illness.    All other systems reviewed and are negative.  EKGs/Labs/Other Studies Reviewed:    The following studies were reviewed today: Echo: IMPRESSIONS     1. The aortic valve is heavily calcified and thickened with severely  restricted leaflet motion. Given valve morphology, suspect severe  paradoxical aortic stenosis with AVA 0.55, mean gradient 92mmHg, peak  gradient 47mmHg, Vmax 3.68m/s, DI 0.17. SVI low  at 31. Gradients likely underestimated due to off-axis doppler  interrogation. Consider CT for further evaluation.   2. Left ventricular ejection fraction, by estimation, is 60 to 65%. The  left ventricle has normal function. The left ventricle has no regional  wall motion abnormalities. There is mild asymmetric left ventricular  hypertrophy of the basal-septal segment.  Left ventricular diastolic parameters are consistent with Grade I  diastolic dysfunction (impaired relaxation).   3. Right ventricular systolic function is normal. The right ventricular  size is normal. There is normal pulmonary artery systolic pressure. The  estimated right ventricular systolic pressure is 59.5 mmHg.   4. The mitral valve is abnormal. Trivial mitral valve regurgitation.  Moderate mitral annular calcification.   5. There is severe calcifcation of the aortic valve. There is  severe  thickening of the aortic valve. Aortic valve regurgitation is not  visualized.   6. The inferior vena cava is normal in size with greater than 50%  respiratory variability, suggesting right atrial pressure of 3 mmHg.   Comparison(s): Compared to prior TTE in 12/2020, the aortic valve stenosis  appears worse and now suspect that there is severe, paradoxical aortic  stenosis. AVA now 0.55 (previously 0.81) with DI 0.18 (previously 0.26).  Mean gradient now 60mmHg and  previously 37mmHg.   CTA Cardiac TAVR Scan: IMPRESSION: 1. Tri leaflet AV calcified with restricted motion annular area 369 suitable for a 23 mm Sapien 3 valve   2. Optimum angiographic angle for deployment LAO 18 Caudal 10 degrees   3. Normal ascending thoracic aorta 2.8 cm with tortuous descending thoracic aorta   4.  Coronary arteries sufficient height above annulus for deplpyment  CTA Chest/Abd/Pelvis: VASCULAR MEASUREMENTS PERTINENT TO TAVR:   AORTA:   Minimal Aortic Diameter-11 x 9 mm   Severity of Aortic Calcification-moderate to severe   RIGHT PELVIS:   Right Common Iliac Artery -   Minimal Diameter-8.6 x 7.9 mm   Tortuosity - mild   Calcification-moderate   Right External Iliac Artery -   Minimal Diameter-6.1 x 5.9 mm   Tortuosity - mild   Calcification-none   Right Common Femoral Artery -   Minimal Diameter-5.9 x 6.0 mm   Tortuosity - mild   Calcification-none   LEFT PELVIS:   Left Common Iliac Artery -   Minimal Diameter-9.5 x 8.6 mm   Tortuosity - mild   Calcification-mild   Left External Iliac Artery -   Minimal Diameter-6.1 x 6.2 mm   Tortuosity - mild   Calcification-none   Left Common Femoral Artery -   Minimal Diameter-5.7 x 5.4 mm   Tortuosity-mild   Calcification-none   Review of the MIP images confirms the above findings.   IMPRESSION: 1. Vascular findings and measurements pertinent to potential TAVR procedure, as detailed above. 2.  Severe thickening and calcification of the aortic valve, compatible with reported clinical history of severe aortic stenosis. 3. Aortic atherosclerosis, in addition to left main and three-vessel coronary artery disease. 4. Colonic diverticulosis without evidence of acute diverticulitis at this time. 5. Additional incidental findings, as above.   STS Risk Calculator: Risk of Mortality:  3.904%  Renal Failure:  1.582%  Permanent Stroke:  2.615%  Prolonged Ventilation:  7.061%  DSW Infection:  0.102%  Reoperation:  3.142%  Morbidity or Mortality:  11.720%  Short Length of Stay:  28.372%  Long Length of Stay:  5.608%   EKG:  EKG is ordered today.  The ekg ordered today demonstrates NSR, LVH with repolarization abnormality  Recent Labs: 08/19/2021: ALT 12 11/15/2021: BUN 16; Creatinine, Ser 0.76; Potassium 4.2; Sodium 140 11/23/2021: Hemoglobin 14.1; Platelets 185  Recent Lipid Panel    Component Value Date/Time   CHOL 193 08/19/2021 1108   TRIG 135 08/19/2021 1108   HDL 65 08/19/2021 1108   CHOLHDL 3.0 08/19/2021 1108   CHOLHDL 3.6 12/16/2014 1005   VLDL 33 12/16/2014 1005   LDLCALC 104 (H) 08/19/2021 1108   LDLDIRECT 89 03/23/2015 1620     Risk Assessment/Calculations:           Physical Exam:    VS:  BP 140/80    Pulse 71    Ht 5' (1.524 m)    Wt 122 lb (55.3 kg)    SpO2 95%    BMI 23.83 kg/m     Wt Readings from Last 3 Encounters:  11/23/21 122 lb (55.3 kg)  11/07/21 122 lb 12.8 oz (55.7 kg)  08/19/21 125 lb (56.7 kg)     GEN:  Well nourished, well developed elderly woman in no acute distress HEENT: Normal NECK: No JVD; No carotid bruits LYMPHATICS: No lymphadenopathy CARDIAC: RRR, 3/6 harsh late peaking systolic murmur at the RUSB RESPIRATORY:  Clear to auscultation without rales, wheezing or rhonchi  ABDOMEN: Soft, non-tender, non-distended MUSCULOSKELETAL:  No edema; No deformity  SKIN: Warm and dry NEUROLOGIC:  Alert and oriented x  3 PSYCHIATRIC:  Normal affect   ASSESSMENT:    1. Severe aortic stenosis    PLAN:    In order of problems listed above:  The patient has severe, Stage d3, paradoxical low flow low gradient aortic  stenosis.  She has symptoms of progressive fatigue and exertional dyspnea consistent with New York Heart Association functional class II limitation.  The patient's echocardiogram is reviewed and shows a severely calcified and restricted aortic valve with peak and mean transvalvular gradients of 46 and 27 mmHg, respectively.  The patient's peak transaortic velocity is 3.4 m/s with a dimensionless index of 0.17 and stroke-volume index of 32.  LVEF is normal at 60 to 65% with mild basal septal hypertrophy.  I reviewed the natural history of severe, symptomatic aortic stenosis with the patient and her daughter today.  We discussed treatment options in the context of her age and comorbid medical conditions.  Her primary medical issues include type 2 diabetes and mild dementia.  The patient and her daughter expressed interest to pursue treatment for transcatheter aortic valve replacement.  They understand that preprocedural testing will be required and will include diagnostic right and left heart catheterization and possible PCI. I have reviewed the risks, indications, and alternatives to cardiac catheterization, possible angioplasty, and stenting with the patient. Risks include but are not limited to bleeding, infection, vascular injury, stroke, myocardial infection, arrhythmia, kidney injury, radiation-related injury in the case of prolonged fluoroscopy use, emergency cardiac surgery, and death. The patient understands the risks of serious complication is 1-2 in 6195 with diagnostic cardiac cath and 1-2% or less with angioplasty/stenting.  The patient has already undergone CTA studies of the heart and the chest, abdomen, and pelvis.  The studies confirm suitable transfemoral access for TAVR and demonstrated an aortic  valve calcium score of 1289, consistent with the diagnosis of severe aortic stenosis.  The patient's aortic valve is trileaflet and she has an annular area of 369 mm suitable for a 23 mm Edwards SAPIEN 3 Resilia valve.  Once her cardiac catheterization procedure is completed, she will be referred for formal cardiac surgical consultation as part of a multidisciplinary heart team approach to her care.      Shared Decision Making/Informed Consent The risks [stroke (1 in 1000), death (1 in 1000), kidney failure [usually temporary] (1 in 500), bleeding (1 in 200), allergic reaction [possibly serious] (1 in 200)], benefits (diagnostic support and management of coronary artery disease) and alternatives of a cardiac catheterization were discussed in detail with Ms. Abbett and she is willing to proceed.    Medication Adjustments/Labs and Tests Ordered: Current medicines are reviewed at length with the patient today.  Concerns regarding medicines are outlined above.  Orders Placed This Encounter  Procedures   CBC   EKG 12-Lead   No orders of the defined types were placed in this encounter.   Patient Instructions  Medication Instructions:  Your physician recommends that you continue on your current medications as directed. Please refer to the Current Medication list given to you today.  *If you need a refill on your cardiac medications before your next appointment, please call your pharmacy*   Lab Work: TODAY: CBC If you have labs (blood work) drawn today and your tests are completely normal, you will receive your results only by: [facility] (if you have MyChart) OR A paper copy in the mail If you have any lab test that is abnormal or we need to change your treatment, we will call you to review the results.   Testing/Procedures: Your physician has requested that you have a cardiac catheterization. Cardiac catheterization is used to diagnose and/or treat various heart conditions.  Doctors may recommend this procedure for a number of different reasons. The most  common reason is to evaluate chest pain. Chest pain can be a symptom of coronary artery disease (CAD), and cardiac catheterization can show whether plaque is narrowing or blocking your hearts arteries. This procedure is also used to evaluate the valves, as well as measure the blood flow and oxygen levels in different parts of your heart. For further information please visit HugeFiesta.[email or web address]. Please follow instruction sheet, as given.  Follow-Up: At [facility], you and your health needs are our priority.  As part of our continuing mission to provide you with exceptional heart care, we have created designated Provider Care Teams.  These Care Teams include your primary Cardiologist (physician) and Advanced Practice Providers (APPs -  Physician Assistants and Nurse Practitioners) who all work together to provide you with the care you need, when you need it.  You will be contacted by the Structural Heart Team to scheduled your follow up. }   Other Instructions  [facility] OFFICE [place], [place] [address] Dept: [phone] Loc: [place]  11/23/2021  You are scheduled for a Cardiac Catheterization on Wednesday, December 14 with Dr. Lenna Sciara.  1. Please arrive at the [facility] (Main Entrance A) at [facility]: [address] [place], [place] [address] at 8:00 AM (This time is two hours before your procedure to ensure your preparation). Free valet parking service is available.   Special note: Every effort is made to have your procedure done on time. Please understand that emergencies sometimes delay scheduled procedures.  2. Diet: Do not eat solid foods after midnight.  The patient may have clear liquids until 5am upon the day of the procedure.  3. Labs: You will need to have  blood drawn today.   4. Medication instructions in preparation for your procedure:   Contrast Allergy: No  On the morning of your procedure, take your Aspirin and any morning medicines NOT listed above.  You may use sips of water.  5. Plan for one night stay--bring personal belongings. 6. Bring a current list of your medications and current insurance cards. 7. You MUST have a responsible person to drive you home. 8. Someone MUST be with you the first 24 hours after you arrive home or your discharge will be delayed. 9. Please wear clothes that are easy to get on and off and wear slip-on shoes.  Thank you for allowing [place] to care for you!   -- [facility] Invasive Cardiovascular services     Signed, Sherren Mocha, MD  11/27/2021 4:12 PM    [facility]

## 2021-11-23 NOTE — Patient Instructions (Signed)
Medication Instructions:  Your physician recommends that you continue on your current medications as directed. Please refer to the Current Medication list given to you today.  *If you need a refill on your cardiac medications before your next appointment, please call your pharmacy*   Lab Work: TODAY: CBC If you have labs (blood work) drawn today and your tests are completely normal, you will receive your results only by: [place] (if you have MyChart) OR A paper copy in the mail If you have any lab test that is abnormal or we need to change your treatment, we will call you to review the results.   Testing/Procedures: Your physician has requested that you have a cardiac catheterization. Cardiac catheterization is used to diagnose and/or treat various heart conditions. Doctors may recommend this procedure for a number of different reasons. The most common reason is to evaluate chest pain. Chest pain can be a symptom of coronary artery disease (CAD), and cardiac catheterization can show whether plaque is narrowing or blocking your heart's arteries. This procedure is also used to evaluate the valves, as well as measure the blood flow and oxygen levels in different parts of your heart. For further information please visit HugeFiesta.[email or web address]. Please follow instruction sheet, as given.  Follow-Up: At [facility], you and your health needs are our priority.  As part of our continuing mission to provide you with exceptional heart care, we have created designated Provider Care Teams.  These Care Teams include your primary Cardiologist (physician) and Advanced Practice Providers (APPs -  Physician Assistants and Nurse Practitioners) who all work together to provide you with the care you need, when you need it.  You will be contacted by the Structural Heart Team to scheduled your follow up. }   Other Instructions  [place] OFFICE [place], [place] [address] Dept: [phone] Loc: [place]  11/23/2021  You are scheduled for a Cardiac Catheterization on Wednesday, December 14 with Dr. Lenna Sciara.  1. Please arrive at the [facility] (Main Entrance A) at [facility]: [address] [place], [place] [address] at 8:00 AM (This time is two hours before your procedure to ensure your preparation). Free valet parking service is available.   Special note: Every effort is made to have your procedure done on time. Please understand that emergencies sometimes delay scheduled procedures.  2. Diet: Do not eat solid foods after midnight.  The patient may have clear liquids until 5am upon the day of the procedure.  3. Labs: You will need to have blood drawn today.   4. Medication instructions in preparation for your procedure:   Contrast Allergy: No  On the morning of your procedure, take your Aspirin and any morning medicines NOT listed above.  You may use sips of water.  5. Plan for one night stay--bring personal belongings. 6. Bring a current list of your medications and current insurance cards. 7. You MUST have a responsible person to drive you home. 8. Someone MUST be with you the first 24 hours after you arrive home or your discharge will be delayed. 9. Please wear clothes that are easy to get on and off and wear slip-on shoes.  Thank you for allowing [place] to care for you!   -- [place] Invasive Cardiovascular services

## 2021-11-23 NOTE — Progress Notes (Signed)
Cardiology Office Note:    Date:  11/27/2021   ID:  Mandy Webb, DOB 06/07/1946, MRN [id number]  PCP:  [name], MD   [place] Providers Cardiologist:  Freada Bergeron, MD     Referring MD: Lucianne Lei, MD   Chief Complaint  Patient presents with   Shortness of Breath     History of Present Illness:    Mandy Webb is a 74 y.o. female with a hx of: Aortic stenosis Moderate; Echocardiogram 1/22: EF 60-65, Gr 1 DD, mean 22.2 mmHg, VMax 308 cm/s, DI 0.26 Severe; echocardiogram 11/22, suggests severe stage D3 paradoxical low-flow low gradient aortic stenosis with mean gradient 27 mmHg, dimensionless index 0.17, and stroke-volume index 32. Diabetes mellitus  Hypertension  Hyperlipidemia  Asthma  GERD   The patient is here with her daughter today.  She is able to function independently, but she has developed mild dementia.  She is interested in pursuing any treatment options that would help her quality of life.  She complains of increasing fatigue and shortness of breath with activity over the past 6 months.  She is short of breath with any moderate level activities like walking outside. She has not had chest pain or pressure, orthopnea, PND, or leg swelling.  She has had some episodes of lightheadedness and presyncope associated with low blood pressure readings.   Past Medical History:  Diagnosis Date   Allergy    Anemia    Aortic stenosis    Echo 11/22: EF 60-65, no RWMA, mild asymmetric LVH, GR 1 DD, probable severe paradoxical aortic stenosis (mean gradient 27 mmHg, V-max 340 cm/s, DI 0.17), normal RVSF, RVSP 24.9, trivial MR   Arthritis    Asthma    Diabetes mellitus    GERD (gastroesophageal reflux disease)    Hyperlipidemia    Hypertension    Low blood pressure reading    Syncope     Past Surgical History:  Procedure Laterality Date   APPENDECTOMY     COLONOSCOPY     PITUITARY SURGERY     TUBAL LIGATION      Current  Medications: Current Meds  Medication Sig   albuterol (PROVENTIL HFA;VENTOLIN HFA) 108 (90 BASE) MCG/ACT inhaler Inhale 2 puffs into the lungs every 6 (six) hours as needed for wheezing (cough, shortness of breath or wheezing.).   Alpha-Lipoic Acid 600 MG CAPS Take 600 mg by mouth daily.   Brinzolamide-Brimonidine (SIMBRINZA) 1-0.2 % SUSP Apply 1 drop to eye in the morning, at noon, and at bedtime.   donepezil (ARICEPT) 10 MG tablet Take 10 mg by mouth at bedtime.   famotidine (PEPCID) 10 MG tablet Take 10 mg by mouth 2 (two) times daily.   levothyroxine (SYNTHROID) 100 MCG tablet Take 1 tablet (100 mcg total) by mouth daily before breakfast.   losartan (COZAAR) 25 MG tablet Take 1 tablet (25 mg total) by mouth daily.   Multiple Vitamins-Minerals (PRESERVISION AREDS 2) CHEW Chew 1 tablet by mouth daily.   polyethylene glycol powder (GLYCOLAX/MIRALAX) powder TAKE 17 G BY MOUTH DAILY   ROCKLATAN 0.02-0.005 % SOLN Apply 1 drop to eye at bedtime.   rosuvastatin (CRESTOR) 40 MG tablet Take 40 mg by mouth daily.   timolol (TIMOPTIC) 0.5 % ophthalmic solution Place 1 drop into both eyes every morning.   [DISCONTINUED] docusate sodium (COLACE) 50 MG capsule Take 50 mg by mouth 2 (two) times daily.   [DISCONTINUED] indapamide (LOZOL) 1.25 MG tablet      Allergies:  Protonix [pantoprazole sodium]   Social History   Socioeconomic History   Marital status: Single    Spouse name: Not on file   Number of children: 3   Years of education: Not on file   Highest education level: Not on file  Occupational History   Occupation: Retired    Fish farm manager: UNEMPLOYED  Tobacco Use   Smoking status: Former    Types: Cigarettes    Quit date: 02/02/1969    Years since quitting: 52.8   Smokeless tobacco: Never  Vaping Use   Vaping Use: Never used  Substance and Sexual Activity   Alcohol use: No    Alcohol/week: 0.0 standard drinks   Drug use: No   Sexual activity: Never  Other Topics Concern   Not on  file  Social History Narrative   Lives alone;   1 daughter lives in [place]   1 son lives in [place] Strain: Not on file  Food Insecurity: Not on file  Transportation Needs: Not on file  Physical Activity: Not on file  Stress: Not on file  Social Connections: Not on file     Family History: The patient's family history includes Cancer in her mother; Heart disease in her brother; Hypertension in her father; Stomach cancer in her mother. There is no history of Esophageal cancer, Colon polyps, or Rectal cancer.  ROS:   Please see the history of present illness.    All other systems reviewed and are negative.  EKGs/Labs/Other Studies Reviewed:    The following studies were reviewed today: Echo: IMPRESSIONS     1. The aortic valve is heavily calcified and thickened with severely  restricted leaflet motion. Given valve morphology, suspect severe  paradoxical aortic stenosis with AVA 0.55, mean gradient 45mmHg, peak  gradient 27mmHg, Vmax 3.35m/s, DI 0.17. SVI low  at 31. Gradients likely underestimated due to off-axis doppler  interrogation. Consider CT for further evaluation.   2. Left ventricular ejection fraction, by estimation, is 60 to 65%. The  left ventricle has normal function. The left ventricle has no regional  wall motion abnormalities. There is mild asymmetric left ventricular  hypertrophy of the basal-septal segment.  Left ventricular diastolic parameters are consistent with Grade I  diastolic dysfunction (impaired relaxation).   3. Right ventricular systolic function is normal. The right ventricular  size is normal. There is normal pulmonary artery systolic pressure. The  estimated right ventricular systolic pressure is 07.3 mmHg.   4. The mitral valve is abnormal. Trivial mitral valve regurgitation.  Moderate mitral annular calcification.   5. There is severe calcifcation of the aortic valve. There is  severe  thickening of the aortic valve. Aortic valve regurgitation is not  visualized.   6. The inferior vena cava is normal in size with greater than 50%  respiratory variability, suggesting right atrial pressure of 3 mmHg.   Comparison(s): Compared to prior TTE in 12/2020, the aortic valve stenosis  appears worse and now suspect that there is severe, paradoxical aortic  stenosis. AVA now 0.55 (previously 0.81) with DI 0.18 (previously 0.26).  Mean gradient now 11mmHg and  previously 93mmHg.   CTA Cardiac TAVR Scan: IMPRESSION: 1. Tri leaflet AV calcified with restricted motion annular area 369 suitable for a 23 mm Sapien 3 valve   2. Optimum angiographic angle for deployment LAO 18 Caudal 10 degrees   3. Normal ascending thoracic aorta 2.8 cm with tortuous descending thoracic aorta   4.  Coronary arteries sufficient height above annulus for deplpyment  CTA Chest/Abd/Pelvis: VASCULAR MEASUREMENTS PERTINENT TO TAVR:   AORTA:   Minimal Aortic Diameter-11 x 9 mm   Severity of Aortic Calcification-moderate to severe   RIGHT PELVIS:   Right Common Iliac Artery -   Minimal Diameter-8.6 x 7.9 mm   Tortuosity - mild   Calcification-moderate   Right External Iliac Artery -   Minimal Diameter-6.1 x 5.9 mm   Tortuosity - mild   Calcification-none   Right Common Femoral Artery -   Minimal Diameter-5.9 x 6.0 mm   Tortuosity - mild   Calcification-none   LEFT PELVIS:   Left Common Iliac Artery -   Minimal Diameter-9.5 x 8.6 mm   Tortuosity - mild   Calcification-mild   Left External Iliac Artery -   Minimal Diameter-6.1 x 6.2 mm   Tortuosity - mild   Calcification-none   Left Common Femoral Artery -   Minimal Diameter-5.7 x 5.4 mm   Tortuosity-mild   Calcification-none   Review of the MIP images confirms the above findings.   IMPRESSION: 1. Vascular findings and measurements pertinent to potential TAVR procedure, as detailed above. 2.  Severe thickening and calcification of the aortic valve, compatible with reported clinical history of severe aortic stenosis. 3. Aortic atherosclerosis, in addition to left main and three-vessel coronary artery disease. 4. Colonic diverticulosis without evidence of acute diverticulitis at this time. 5. Additional incidental findings, as above.   STS Risk Calculator: Risk of Mortality:  3.904%  Renal Failure:  1.582%  Permanent Stroke:  2.615%  Prolonged Ventilation:  7.061%  DSW Infection:  0.102%  Reoperation:  3.142%  Morbidity or Mortality:  11.720%  Short Length of Stay:  28.372%  Long Length of Stay:  5.608%   EKG:  EKG is ordered today.  The ekg ordered today demonstrates NSR, LVH with repolarization abnormality  Recent Labs: 08/19/2021: ALT 12 11/15/2021: BUN 16; Creatinine, Ser 0.76; Potassium 4.2; Sodium 140 11/23/2021: Hemoglobin 14.1; Platelets 185  Recent Lipid Panel    Component Value Date/Time   CHOL 193 08/19/2021 1108   TRIG 135 08/19/2021 1108   HDL 65 08/19/2021 1108   CHOLHDL 3.0 08/19/2021 1108   CHOLHDL 3.6 12/16/2014 1005   VLDL 33 12/16/2014 1005   LDLCALC 104 (H) 08/19/2021 1108   LDLDIRECT 89 03/23/2015 1620     Risk Assessment/Calculations:           Physical Exam:    VS:  BP 140/80   Pulse 71   Ht 5' (1.524 m)   Wt 122 lb (55.3 kg)   SpO2 95%   BMI 23.83 kg/m     Wt Readings from Last 3 Encounters:  11/23/21 122 lb (55.3 kg)  11/07/21 122 lb 12.8 oz (55.7 kg)  08/19/21 125 lb (56.7 kg)     GEN:  Well nourished, well developed elderly woman in no acute distress HEENT: Normal NECK: No JVD; No carotid bruits LYMPHATICS: No lymphadenopathy CARDIAC: RRR, 3/6 harsh late peaking systolic murmur at the RUSB RESPIRATORY:  Clear to auscultation without rales, wheezing or rhonchi  ABDOMEN: Soft, non-tender, non-distended MUSCULOSKELETAL:  No edema; No deformity  SKIN: Warm and dry NEUROLOGIC:  Alert and oriented x  3 PSYCHIATRIC:  Normal affect   ASSESSMENT:    1. Severe aortic stenosis    PLAN:    In order of problems listed above:  The patient has severe, Stage d3, paradoxical low flow low gradient aortic stenosis.  She has symptoms  of progressive fatigue and exertional dyspnea consistent with New York Heart Association functional class II limitation.  The patient's echocardiogram is reviewed and shows a severely calcified and restricted aortic valve with peak and mean transvalvular gradients of 46 and 27 mmHg, respectively.  The patient's peak transaortic velocity is 3.4 m/s with a dimensionless index of 0.17 and stroke-volume index of 32.  LVEF is normal at 60 to 65% with mild basal septal hypertrophy.  I reviewed the natural history of severe, symptomatic aortic stenosis with the patient and her daughter today.  We discussed treatment options in the context of her age and comorbid medical conditions.  Her primary medical issues include type 2 diabetes and mild dementia.  The patient and her daughter expressed interest to pursue treatment for transcatheter aortic valve replacement.  They understand that preprocedural testing will be required and will include diagnostic right and left heart catheterization and possible PCI. I have reviewed the risks, indications, and alternatives to cardiac catheterization, possible angioplasty, and stenting with the patient. Risks include but are not limited to bleeding, infection, vascular injury, stroke, myocardial infection, arrhythmia, kidney injury, radiation-related injury in the case of prolonged fluoroscopy use, emergency cardiac surgery, and death. The patient understands the risks of serious complication is 1-2 in 7106 with diagnostic cardiac cath and 1-2% or less with angioplasty/stenting.  The patient has already undergone CTA studies of the heart and the chest, abdomen, and pelvis.  The studies confirm suitable transfemoral access for TAVR and demonstrated an aortic  valve calcium score of 1289, consistent with the diagnosis of severe aortic stenosis.  The patient's aortic valve is trileaflet and she has an annular area of 369 mm suitable for a 23 mm Edwards SAPIEN 3 Resilia valve.  Once her cardiac catheterization procedure is completed, she will be referred for formal cardiac surgical consultation as part of a multidisciplinary heart team approach to her care.      Shared Decision Making/Informed Consent The risks [stroke (1 in 1000), death (1 in 1000), kidney failure [usually temporary] (1 in 500), bleeding (1 in 200), allergic reaction [possibly serious] (1 in 200)], benefits (diagnostic support and management of coronary artery disease) and alternatives of a cardiac catheterization were discussed in detail with Ms. Weare and she is willing to proceed.    Medication Adjustments/Labs and Tests Ordered: Current medicines are reviewed at length with the patient today.  Concerns regarding medicines are outlined above.  Orders Placed This Encounter  Procedures   CBC   EKG 12-Lead   No orders of the defined types were placed in this encounter.   Patient Instructions  Medication Instructions:  Your physician recommends that you continue on your current medications as directed. Please refer to the Current Medication list given to you today.  *If you need a refill on your cardiac medications before your next appointment, please call your pharmacy*   Lab Work: TODAY: CBC If you have labs (blood work) drawn today and your tests are completely normal, you will receive your results only by: [place] (if you have MyChart) OR A paper copy in the mail If you have any lab test that is abnormal or we need to change your treatment, we will call you to review the results.   Testing/Procedures: Your physician has requested that you have a cardiac catheterization. Cardiac catheterization is used to diagnose and/or treat various heart conditions.  Doctors may recommend this procedure for a number of different reasons. The most common reason is to evaluate  chest pain. Chest pain can be a symptom of coronary artery disease (CAD), and cardiac catheterization can show whether plaque is narrowing or blocking your heart's arteries. This procedure is also used to evaluate the valves, as well as measure the blood flow and oxygen levels in different parts of your heart. For further information please visit [email or web address]. Please follow instruction sheet, as given.  Follow-Up: At [facility], you and your health needs are our priority.  As part of our continuing mission to provide you with exceptional heart care, we have created designated Provider Care Teams.  These Care Teams include your primary Cardiologist (physician) and Advanced Practice Providers (APPs -  Physician Assistants and Nurse Practitioners) who all work together to provide you with the care you need, when you need it.  You will be contacted by the Structural Heart Team to scheduled your follow up. }   Other Instructions  [place] OFFICE [place], [place] [address] Dept: [phone] Loc: [place]  11/23/2021  You are scheduled for a Cardiac Catheterization on Wednesday, December 14 with Dr. Lenna Sciara.  1. Please arrive at the [facility] (Main Entrance A) at [facility]: [address] [place], [place] [address] at 8:00 AM (This time is two hours before your procedure to ensure your preparation). Free valet parking service is available.   Special note: Every effort is made to have your procedure done on time. Please understand that emergencies sometimes delay scheduled procedures.  2. Diet: Do not eat solid foods after midnight.  The patient may have clear liquids until 5am upon the day of the procedure.  3. Labs: You will need to have  blood drawn today.   4. Medication instructions in preparation for your procedure:   Contrast Allergy: No  On the morning of your procedure, take your Aspirin and any morning medicines NOT listed above.  You may use sips of water.  5. Plan for one night stay--bring personal belongings. 6. Bring a current list of your medications and current insurance cards. 7. You MUST have a responsible person to drive you home. 8. Someone MUST be with you the first 24 hours after you arrive home or your discharge will be delayed. 9. Please wear clothes that are easy to get on and off and wear slip-on shoes.  Thank you for allowing [place] to care for you!   -- [facility] Invasive Cardiovascular services     Signed, Sherren Mocha, MD  11/27/2021 4:12 PM    [facility]

## 2021-11-27 ENCOUNTER — Encounter: Payer: Self-pay | Admitting: Cardiovascular Disease

## 2021-11-28 ENCOUNTER — Telehealth: Payer: Self-pay | Admitting: *Deleted

## 2021-11-28 NOTE — Telephone Encounter (Signed)
Cardiac catheterization scheduled at [facility] for: Wednesday November 30, 2021 [facility] Main Entrance A [facility]) at: 8 AM   Diet-no solid food after midnight prior to cath, clear liquids until 5 AM day of procedure.  Medication instructions for procedure: -Usual morning medications can be taken pre-cath with sips of water including aspirin 81 mg.    Confirmed patient has responsible adult to drive home post procedure and be with patient first 24 hours after arriving home.  [facility] does allow one visitor to accompany you and wait in the hospital waiting room while you are there for your procedure. You and your visitor will be asked to wear a mask once you enter the hospital.   Patient reports does not currently have any new symptoms concerning for COVID-19 and no household members with COVID-19 like illness.     Reviewed procedure/mask/visitor instructions with patient's daughter-she did accompany patient to 11/23/21 office visit with Dr Burt Knack.

## 2021-11-29 DIAGNOSIS — H2513 Age-related nuclear cataract, bilateral: Secondary | ICD-10-CM | POA: Insufficient documentation

## 2021-11-29 DIAGNOSIS — H401133 Primary open-angle glaucoma, bilateral, severe stage: Secondary | ICD-10-CM | POA: Insufficient documentation

## 2021-11-30 ENCOUNTER — Other Ambulatory Visit: Payer: Self-pay

## 2021-11-30 ENCOUNTER — Ambulatory Visit (HOSPITAL_COMMUNITY)
Admission: RE | Admit: 2021-11-30 | Discharge: 2021-11-30 | Disposition: A | Discharge status: Home / Self Care | Payer: Medicare (Managed Care) | Attending: Internal Medicine | Admitting: Internal Medicine

## 2021-11-30 ENCOUNTER — Encounter (HOSPITAL_COMMUNITY)
Admission: RE | Disposition: A | Discharge status: Home / Self Care | Payer: Self-pay | Source: Home / Self Care | Attending: Internal Medicine

## 2021-11-30 DIAGNOSIS — I10 Essential (primary) hypertension: Secondary | ICD-10-CM | POA: Diagnosis not present

## 2021-11-30 DIAGNOSIS — I350 Nonrheumatic aortic (valve) stenosis: Secondary | ICD-10-CM | POA: Diagnosis not present

## 2021-11-30 DIAGNOSIS — I2510 Atherosclerotic heart disease of native coronary artery without angina pectoris: Secondary | ICD-10-CM | POA: Insufficient documentation

## 2021-11-30 DIAGNOSIS — F03A0 Unspecified dementia, mild, without behavioral disturbance, psychotic disturbance, mood disturbance, and anxiety: Secondary | ICD-10-CM | POA: Insufficient documentation

## 2021-11-30 DIAGNOSIS — E785 Hyperlipidemia, unspecified: Secondary | ICD-10-CM | POA: Diagnosis not present

## 2021-11-30 DIAGNOSIS — J45909 Unspecified asthma, uncomplicated: Secondary | ICD-10-CM | POA: Insufficient documentation

## 2021-11-30 DIAGNOSIS — I080 Rheumatic disorders of both mitral and aortic valves: Secondary | ICD-10-CM | POA: Insufficient documentation

## 2021-11-30 DIAGNOSIS — K219 Gastro-esophageal reflux disease without esophagitis: Secondary | ICD-10-CM | POA: Insufficient documentation

## 2021-11-30 DIAGNOSIS — E119 Type 2 diabetes mellitus without complications: Secondary | ICD-10-CM | POA: Insufficient documentation

## 2021-11-30 HISTORY — PX: RIGHT/LEFT HEART CATH AND CORONARY ANGIOGRAPHY: CATH118266

## 2021-11-30 LAB — POCT I-STAT EG7
Acid-Base Excess: 4 mmol/L — ABNORMAL HIGH (ref 0.0–2.0)
Acid-Base Excess: 5 mmol/L — ABNORMAL HIGH (ref 0.0–2.0)
Bicarbonate: 32 mmol/L — ABNORMAL HIGH (ref 20.0–28.0)
Bicarbonate: 33.3 mmol/L — ABNORMAL HIGH (ref 20.0–28.0)
Calcium, Ion: 1.15 mmol/L (ref 1.15–1.40)
Calcium, Ion: 1.2 mmol/L (ref 1.15–1.40)
HCT: 36 % (ref 36.0–46.0)
HCT: 37 % (ref 36.0–46.0)
Hemoglobin: 12.2 g/dL (ref 12.0–15.0)
Hemoglobin: 12.6 g/dL (ref 12.0–15.0)
O2 Saturation: 62 %
O2 Saturation: 63 %
Potassium: 3.4 mmol/L — ABNORMAL LOW (ref 3.5–5.1)
Potassium: 3.8 mmol/L (ref 3.5–5.1)
Sodium: 139 mmol/L (ref 135–145)
Sodium: 141 mmol/L (ref 135–145)
TCO2: 34 mmol/L — ABNORMAL HIGH (ref 22–32)
TCO2: 35 mmol/L — ABNORMAL HIGH (ref 22–32)
pCO2, Ven: 62.9 mmHg — ABNORMAL HIGH (ref 44.0–60.0)
pCO2, Ven: 66.5 mmHg — ABNORMAL HIGH (ref 44.0–60.0)
pH, Ven: 7.307 (ref 7.250–7.430)
pH, Ven: 7.315 (ref 7.250–7.430)
pO2, Ven: 36 mmHg (ref 32.0–45.0)
pO2, Ven: 37 mmHg (ref 32.0–45.0)

## 2021-11-30 LAB — GLUCOSE, CAPILLARY
Glucose-Capillary: 83 mg/dL (ref 70–99)
Glucose-Capillary: 72 mg/dL (ref 70–99)

## 2021-11-30 LAB — POCT I-STAT 7, (LYTES, BLD GAS, ICA,H+H)
Acid-Base Excess: 3 mmol/L — ABNORMAL HIGH (ref 0.0–2.0)
Bicarbonate: 30.3 mmol/L — ABNORMAL HIGH (ref 20.0–28.0)
Calcium, Ion: 1.14 mmol/L — ABNORMAL LOW (ref 1.15–1.40)
HCT: 39 % (ref 36.0–46.0)
Hemoglobin: 13.3 g/dL (ref 12.0–15.0)
O2 Saturation: 89 %
Potassium: 3.7 mmol/L (ref 3.5–5.1)
Sodium: 141 mmol/L (ref 135–145)
TCO2: 32 mmol/L (ref 22–32)
pCO2 arterial: 58 mmHg — ABNORMAL HIGH (ref 32.0–48.0)
pH, Arterial: 7.327 — ABNORMAL LOW (ref 7.350–7.450)
pO2, Arterial: 63 mmHg — ABNORMAL LOW (ref 83.0–108.0)

## 2021-11-30 SURGERY — RIGHT/LEFT HEART CATH AND CORONARY ANGIOGRAPHY
Anesthesia: LOCAL

## 2021-11-30 MED ORDER — HEPARIN (PORCINE) IN NACL 2000-0.9 UNIT/L-% IV SOLN
INTRAVENOUS | Status: DC | PRN
  Administered 2021-11-30: 1000 mL

## 2021-11-30 MED ORDER — SODIUM CHLORIDE 0.9% FLUSH: 3.0000 mL | INTRAVENOUS | Status: DC | PRN

## 2021-11-30 MED ORDER — VERAPAMIL HCL 2.5 MG/ML IV SOLN
INTRAVENOUS | Status: AC
  Filled 2021-11-30: qty 2

## 2021-11-30 MED ORDER — SODIUM CHLORIDE 0.9% FLUSH: 3.0000 mL | Freq: Two times a day (BID) | INTRAVENOUS | Status: DC

## 2021-11-30 MED ORDER — MIDAZOLAM HCL 2 MG/2ML IJ SOLN
INTRAMUSCULAR | Status: DC | PRN
  Administered 2021-11-30: 1 mg via INTRAVENOUS

## 2021-11-30 MED ORDER — HEPARIN SODIUM (PORCINE) 1000 UNIT/ML IJ SOLN
INTRAMUSCULAR | Status: DC | PRN
  Administered 2021-11-30: 5000 [IU] via INTRAVENOUS

## 2021-11-30 MED ORDER — ASPIRIN 81 MG PO CHEW: 81.0000 mg | CHEWABLE_TABLET | ORAL | Status: DC

## 2021-11-30 MED ORDER — HEPARIN SODIUM (PORCINE) 1000 UNIT/ML IJ SOLN
INTRAMUSCULAR | Status: AC
  Filled 2021-11-30: qty 10

## 2021-11-30 MED ORDER — SODIUM CHLORIDE 0.9 % IV SOLN: 250.0000 mL | INTRAVENOUS | Status: DC | PRN

## 2021-11-30 MED ORDER — LIDOCAINE HCL (PF) 1 % IJ SOLN
INTRAMUSCULAR | Status: DC | PRN
  Administered 2021-11-30: 5 mL
  Administered 2021-11-30: 2 mL

## 2021-11-30 MED ORDER — MIDAZOLAM HCL 2 MG/2ML IJ SOLN
INTRAMUSCULAR | Status: AC
  Filled 2021-11-30: qty 2

## 2021-11-30 MED ORDER — SODIUM CHLORIDE 0.9 % WEIGHT BASED INFUSION: 1.0000 mL/kg/h | INTRAVENOUS | Status: DC

## 2021-11-30 MED ORDER — LABETALOL HCL 5 MG/ML IV SOLN: 10.0000 mg | INTRAVENOUS | Status: DC | PRN

## 2021-11-30 MED ORDER — ONDANSETRON HCL 4 MG/2ML IJ SOLN: 4.0000 mg | Freq: Four times a day (QID) | INTRAMUSCULAR | Status: DC | PRN

## 2021-11-30 MED ORDER — LIDOCAINE HCL (PF) 1 % IJ SOLN
INTRAMUSCULAR | Status: AC
  Filled 2021-11-30: qty 30

## 2021-11-30 MED ORDER — HYDRALAZINE HCL 20 MG/ML IJ SOLN: 10.0000 mg | INTRAMUSCULAR | Status: DC | PRN

## 2021-11-30 MED ORDER — SODIUM CHLORIDE 0.9 % WEIGHT BASED INFUSION
3.0000 mL/kg/h | INTRAVENOUS | Status: AC
  Administered 2021-11-30: 09:00:00 3 mL/kg/h via INTRAVENOUS

## 2021-11-30 MED ORDER — IOHEXOL 350 MG/ML SOLN
INTRAVENOUS | Status: DC | PRN
  Administered 2021-11-30: 11:00:00 100 mL

## 2021-11-30 MED ORDER — HEPARIN (PORCINE) IN NACL 1000-0.9 UT/500ML-% IV SOLN
INTRAVENOUS | Status: AC
  Filled 2021-11-30: qty 1000

## 2021-11-30 MED ORDER — ACETAMINOPHEN 325 MG PO TABS: 650.0000 mg | ORAL_TABLET | ORAL | Status: DC | PRN

## 2021-11-30 MED ORDER — VERAPAMIL HCL 2.5 MG/ML IV SOLN
INTRAVENOUS | Status: DC | PRN
  Administered 2021-11-30: 10:00:00 10 mL via INTRA_ARTERIAL

## 2021-11-30 SURGICAL SUPPLY — 19 items
CATH BALLN WEDGE 5F 110CM (CATHETERS) ×1 IMPLANT
CATH DIAG 6FR JR4 (CATHETERS) ×1 IMPLANT
CATH DIAG 6FR PIGTAIL ANGLED (CATHETERS) ×1 IMPLANT
CATH INFINITI 6F FL3.5 (CATHETERS) ×1 IMPLANT
DEVICE RAD TR BAND REGULAR (VASCULAR PRODUCTS) ×1 IMPLANT
GLIDESHEATH SLEND A-KIT 6F 22G (SHEATH) IMPLANT
GLIDESHEATH SLEND SS 6F .021 (SHEATH) ×1 IMPLANT
GUIDEWIRE INQWIRE 1.5J.035X260 (WIRE) IMPLANT
INQWIRE 1.5J .035X260CM (WIRE) ×2
KIT HEART LEFT (KITS) ×3 IMPLANT
PACK CARDIAC CATHETERIZATION (CUSTOM PROCEDURE TRAY) ×3 IMPLANT
SHEATH PINNACLE 5F 10CM (SHEATH) ×1 IMPLANT
SHEATH PROBE COVER 6X72 (BAG) ×1 IMPLANT
STOPCOCK MORSE 400PSI 3WAY (MISCELLANEOUS) ×1 IMPLANT
SYR MEDRAD MARK 7 150ML (SYRINGE) ×3 IMPLANT
TRANSDUCER W/STOPCOCK (MISCELLANEOUS) ×3 IMPLANT
TUBING CIL FLEX 10 FLL-RA (TUBING) ×3 IMPLANT
TUBING CONTRAST HIGH PRESS 48 (TUBING) ×1 IMPLANT
WIRE MICRO SET SILHO 5FR 7 (SHEATH) ×1 IMPLANT

## 2021-11-30 NOTE — Interval H&P Note (Signed)
History and Physical Interval Note:  11/30/2021 8:41 AM  Karalee Height  has presented today for surgery, with the diagnosis of aortic stenosis.  The various methods of treatment have been discussed with the patient and family. After consideration of risks, benefits and other options for treatment, the patient has consented to  Procedure(s): RIGHT/LEFT HEART CATH AND CORONARY ANGIOGRAPHY (N/A) as a surgical intervention.  The patient's history has been reviewed, patient examined, no change in status, stable for surgery.  I have reviewed the patient's chart and labs.  Questions were answered to the patient's satisfaction.    Cath Lab Visit (complete for each Cath Lab visit)  Clinical Evaluation Leading to the Procedure:   ACS: No.  Non-ACS:    Anginal Classification: No Symptoms  Anti-ischemic medical therapy: No Therapy  Non-Invasive Test Results: No non-invasive testing performed  Prior CABG: No previous CABG        [place]

## 2021-11-30 NOTE — Progress Notes (Signed)
5 french sheath removed from right IJ and pressure held x 10 minutes. Site looks good with no hematoma. Dressed with tegaderm and 4x4.

## 2021-12-01 ENCOUNTER — Encounter (HOSPITAL_COMMUNITY): Payer: Self-pay | Admitting: Internal Medicine

## 2021-12-28 ENCOUNTER — Other Ambulatory Visit (HOSPITAL_COMMUNITY): Payer: Medicare (Managed Care)

## 2022-01-05 ENCOUNTER — Other Ambulatory Visit: Payer: Self-pay

## 2022-01-05 ENCOUNTER — Encounter: Payer: Self-pay | Admitting: Physician Assistant

## 2022-01-05 ENCOUNTER — Institutional Professional Consult (permissible substitution) (INDEPENDENT_AMBULATORY_CARE_PROVIDER_SITE_OTHER): Payer: Medicare (Managed Care) | Admitting: Surgery

## 2022-01-05 VITALS — BP 104/66 | HR 90 | Resp 20 | Ht 60.0 in | Wt 115.0 lb

## 2022-01-05 DIAGNOSIS — I350 Nonrheumatic aortic (valve) stenosis: Secondary | ICD-10-CM

## 2022-01-05 DIAGNOSIS — E1165 Type 2 diabetes mellitus with hyperglycemia: Secondary | ICD-10-CM | POA: Insufficient documentation

## 2022-01-05 DIAGNOSIS — E7800 Pure hypercholesterolemia, unspecified: Secondary | ICD-10-CM | POA: Insufficient documentation

## 2022-01-05 DIAGNOSIS — D443 Neoplasm of uncertain behavior of pituitary gland: Secondary | ICD-10-CM | POA: Insufficient documentation

## 2022-01-05 NOTE — Progress Notes (Signed)
Pre Surgical Assessment: 5 M Walk Test  42M=16.67ft  5 Meter Walk Test- trial 1: 19.45 seconds 5 Meter Walk Test- trial 2: 22.75 seconds 5 Meter Walk Test- trial 3: 19.85 seconds 5 Meter Walk Test Average: 20.68 seconds  Patient was able to complete 5 meter walk test, but needed family support for balance while walking.

## 2022-01-06 ENCOUNTER — Other Ambulatory Visit: Payer: Self-pay

## 2022-01-06 ENCOUNTER — Encounter (HOSPITAL_COMMUNITY): Payer: Self-pay

## 2022-01-06 DIAGNOSIS — I350 Nonrheumatic aortic (valve) stenosis: Secondary | ICD-10-CM

## 2022-01-06 NOTE — Progress Notes (Signed)
Surgical Instructions    Your procedure is scheduled on 01/10/22.  Report to [facility] Main Entrance "A" at 8:45 A.M., then check in with the Admitting office.  Call this number if you have problems the morning of surgery:  [phone]   If you have any questions prior to your surgery date call [phone]: Open Monday-Friday 8am-4pm    Remember:  Do not eat or drink after midnight the night before your surgery    STOP now taking any Aleve, Naproxen, Ibuprofen, Motrin, Advil, Goody's, BC's, all herbal medications, fish oil, and all non-prescription vitamins.   Continue taking all other medications without change through the day before surgery. On the morning of surgery do not take any medications.  Bring inhalers with you the day of surgery.  After your COVID test   You are not required to quarantine however you are required to wear a well-fitting mask when you are out and around people not in your household.  If your mask becomes wet or soiled, replace with a new one.  Wash your hands often with soap and water for 20 seconds or clean your hands with an alcohol-based hand sanitizer that contains at least 60% alcohol.  Do not share personal items.  Notify your provider: if you are in close contact with someone who has COVID  or if you develop a fever of 100.4 or greater, sneezing, cough, sore throat, shortness of breath or body aches.           Do not wear jewelry or makeup Do not wear lotions, powders, perfumes/colognes, or deodorant. Do not shave 48 hours prior to surgery.   Do not bring valuables to the hospital. DO Not wear nail polish, gel polish, artificial nails, or any other type of covering on natural nails (fingers and toes) If you have artificial nails or gel coating that need to be removed by a nail salon, please have this removed prior to surgery. Artificial nails or gel coating may interfere with anesthesia's ability to adequately monitor your vital signs.              [facility] is not responsible for any belongings or valuables.  Do NOT Smoke (Tobacco/Vaping)  24 hours prior to your procedure  If you use a CPAP at night, you may bring your mask for your overnight stay.   Contacts, glasses, hearing aids, dentures or partials may not be worn into surgery, please bring cases for these belongings   For patients admitted to the hospital, discharge time will be determined by your treatment team.   Patients discharged the day of surgery will not be allowed to drive home, and someone needs to stay with them for 24 hours.  NO VISITORS WILL BE ALLOWED IN PRE-OP WHERE PATIENTS ARE PREPPED FOR SURGERY.  ONLY 1 SUPPORT PERSON MAY BE PRESENT IN THE WAITING ROOM WHILE YOU ARE IN SURGERY.  IF YOU ARE TO BE ADMITTED, ONCE YOU ARE IN YOUR ROOM YOU WILL BE ALLOWED TWO (2) VISITORS. 1 (ONE) VISITOR MAY STAY OVERNIGHT BUT MUST ARRIVE TO THE ROOM BY 8pm.  Minor children may have two parents present. Special consideration for safety and communication needs will be reviewed on a case by case basis.  Special instructions:    Oral Hygiene is also important to reduce your risk of infection.  Remember - BRUSH YOUR TEETH THE MORNING OF SURGERY WITH YOUR REGULAR TOOTHPASTE   [place]- Preparing For Surgery  Before surgery, you can play an important  role. Because skin is not sterile, your skin needs to be as free of germs as possible. You can reduce the number of germs on your skin by washing with CHG (chlorahexidine gluconate) Soap before surgery.  CHG is an antiseptic cleaner which kills germs and bonds with the skin to continue killing germs even after washing.     Please do not use if you have an allergy to CHG or antibacterial soaps. If your skin becomes reddened/irritated stop using the CHG.  Do not shave (including legs and underarms) for at least 48 hours prior to first CHG shower. It is OK to shave your face.  Please follow these instructions carefully.      Shower the NIGHT BEFORE SURGERY and the MORNING OF SURGERY with CHG Soap.   If you chose to wash your hair, wash your hair first as usual with your normal shampoo. After you shampoo, rinse your hair and body thoroughly to remove the shampoo.  Then [employer] and genitals (private parts) with your normal soap and rinse thoroughly to remove soap.  After that Use CHG Soap as you would any other liquid soap. You can apply CHG directly to the skin and wash gently with a scrungie or a clean washcloth.   Apply the CHG Soap to your body ONLY FROM THE NECK DOWN.  Do not use on open wounds or open sores. Avoid contact with your eyes, ears, mouth and genitals (private parts). Wash Face and genitals (private parts)  with your normal soap.   Wash thoroughly, paying special attention to the area where your surgery will be performed.  Thoroughly rinse your body with warm water from the neck down.  DO NOT shower/wash with your normal soap after using and rinsing off the CHG Soap.  Pat yourself dry with a CLEAN TOWEL.  Wear CLEAN PAJAMAS to bed the night before surgery  Place CLEAN SHEETS on your bed the night before your surgery  DO NOT SLEEP WITH PETS.   Day of Surgery: Take a shower with CHG soap. Wear Clean/Comfortable clothing the morning of surgery Do not apply any deodorants/lotions.   Remember to brush your teeth WITH YOUR REGULAR TOOTHPASTE.   Please read over the following fact sheets that you were given.

## 2022-01-06 NOTE — Progress Notes (Signed)
DUE TO COVID-19 ONLY ONE VISITOR IS ALLOWED TO COME WITH YOU AND STAY IN THE WAITING ROOM ONLY DURING PRE OP AND PROCEDURE DAY OF SURGERY.   Two VISITORS MAY VISIT WITH YOU AFTER SURGERY IN YOUR PRIVATE ROOM DURING VISITING HOURS ONLY!  PCP - Dr Barney Drain Cardiologist - Dr Gwyndolyn Kaufman EP - Dr Sherren Mocha  Chest x-ray - 01/09/22 (2V) EKG - 01/09/22 Stress Test - n/a ECHO - 11/09/21 Cardiac Cath - 11/30/21  ICD Pacemaker/Loop - n/a  Sleep Study -  n/a CPAP - none  Anesthesia review: Yes  STOP now taking any Aspirin (unless otherwise instructed by your surgeon), Aleve, Naproxen, Ibuprofen, Motrin, Advil, Goody's, BC's, all herbal medications, fish oil, and all vitamins.   Coronavirus Screening Covid test is scheduled on 01/09/21 Do you have any of the following symptoms:  Cough yes/no: No Fever (>100.84F)  yes/no: No Runny nose yes/no: No Sore throat yes/no: No Difficulty breathing/shortness of breath  yes/no: No  Have you traveled in the last 14 days and where? yes/no: No  Patient /Daughter Ernst Breach- Wall cell [phone] verbalized understanding of instructions that were given to them at the PAT appointment.

## 2022-01-08 NOTE — Progress Notes (Signed)
Patient ID: Mandy Webb, female   DOB: Jul 31, 1946, 76 y.o.   MRN: [id number]  [facility]         [address]       [place] [address]             [phone]          CARDIOTHORACIC SURGERY CONSULTATION REPORT  PCP is [name], MD Referring Provider is Lenna Sciara, MD Primary Cardiologist is Freada Bergeron, MD  Reason for consultation:  Severe aortic stenosis  HPI:  The patient is a 76 year old woman with a history of diabetes, hypertension, hyperlipidemia, and dementia who lives at home by herself with 24-hour care by aides and her family.  She is here today with her daughter and daughter-in-law who give most of her history.  They report increasing shortness of breath and fatigue with any activity over the past 6 to 9 months.  They said that she currently spends most of her time in bed and would not get up to move around or eat unless she has a lot of encouragement.  She has had some exertional chest pain. She has not had orthopnea or peripheral edema.  She has reported some lightheadedness and feeling like she was going to pass out with low blood pressure readings at home.  2D echocardiogram on 11/09/2021 showed severe aortic stenosis with a severely calcified and thickened aortic valve with poor leaflet mobility.  The mean gradient was 27 mmHg with a peak gradient of 46 mmHg.  Aortic valve area was 0.55 cm with a dimensionless index of 0.17 and a stroke-volume index that was low at 31.  Left ventricular ejection fraction was 60 to 65% with grade 1 diastolic dysfunction.  She underwent cardiac catheterization on 11/30/2021 showing mild diffuse nonobstructive coronary disease.  Filling pressures and cardiac index were within normal limits.  Past Medical History:  Diagnosis Date   Allergy    Anemia    Arthritis    Asthma    Diabetes mellitus    no meds   GERD (gastroesophageal reflux disease)     Hyperlipidemia    Hypertension    Low blood pressure reading    Severe aortic stenosis    Syncope     Past Surgical History:  Procedure Laterality Date   APPENDECTOMY     COLONOSCOPY     PITUITARY SURGERY     RIGHT/LEFT HEART CATH AND CORONARY ANGIOGRAPHY N/A 11/30/2021   Procedure: RIGHT/LEFT HEART CATH AND CORONARY ANGIOGRAPHY;  Surgeon: Early Osmond, MD;  Location: [facility];  Service: Cardiovascular;  Laterality: N/A;   TUBAL LIGATION      Family History  Problem Relation Age of Onset   Stomach cancer Mother    Cancer Mother        stomach cancer   Hypertension Father    Heart disease Brother    Esophageal cancer Neg Hx    Colon polyps Neg Hx    Rectal cancer Neg Hx     Social History   Socioeconomic History   Marital status: Single    Spouse name: Not on file   Number of children: 3   Years of education: Not on file   Highest education level: Not on file  Occupational History   Occupation: Retired    Fish farm manager: UNEMPLOYED  Tobacco Use   Smoking status: Former    Types: Cigarettes    Quit date:  02/02/1969    Years since quitting: 52.9   Smokeless tobacco: Never  Vaping Use   Vaping Use: Never used  Substance and Sexual Activity   Alcohol use: No    Alcohol/week: 0.0 standard drinks   Drug use: No   Sexual activity: Never    Birth control/protection: Post-menopausal, Surgical    Comment: Tubal Ligation  Other Topics Concern   Not on file  Social History Narrative   Lives alone;   1 daughter lives in [place]   1 son lives in [place] Determinants of Health   Financial Resource Strain: Not on file  Food Insecurity: Not on file  Transportation Needs: Not on file  Physical Activity: Not on file  Stress: Not on file  Social Connections: Not on file  Intimate Partner Violence: Not on file    Prior to Admission medications   Medication Sig Start Date End Date Taking? Authorizing Provider  albuterol (PROVENTIL  HFA;VENTOLIN HFA) 108 (90 BASE) MCG/ACT inhaler Inhale 2 puffs into the lungs every 6 (six) hours as needed for wheezing (cough, shortness of breath or wheezing.). 12/16/14  Yes Barton Fanny, MD  Alpha-Lipoic Acid 600 MG CAPS Take 600 mg by mouth daily.   Yes [provider]  Brinzolamide-Brimonidine (SIMBRINZA) 1-0.2 % SUSP Apply 1 drop to eye in the morning, at noon, and at bedtime. 11/16/21  Yes Freada Bergeron, MD  donepezil (ARICEPT) 10 MG tablet Take 10 mg by mouth at bedtime.   Yes [provider]  famotidine (PEPCID) 10 MG tablet Take 10 mg by mouth 2 (two) times daily.   Yes [provider]  hydrocortisone cream 1 % Apply 1 application topically 2 (two) times daily as needed for itching.   Yes [provider]  latanoprost (XALATAN) 0.005 % ophthalmic solution Place 1 drop into both eyes at bedtime. 11/16/21  Yes Freada Bergeron, MD  levothyroxine (SYNTHROID) 100 MCG tablet Take 1 tablet (100 mcg total) by mouth daily before breakfast. 11/16/21  Yes Pemberton, Greer Ee, MD  losartan (COZAAR) 25 MG tablet Take 1 tablet (25 mg total) by mouth daily. 11/07/21  Yes Freada Bergeron, MD  Multiple Vitamins-Minerals (PRESERVISION AREDS 2) CHEW Chew 1 tablet by mouth daily.   Yes [provider]  polyethylene glycol powder (GLYCOLAX/MIRALAX) powder TAKE 17 G BY MOUTH DAILY 06/29/16  Yes Milus Banister, MD  ROCKLATAN 0.02-0.005 % SOLN Apply 1 drop to eye at bedtime. 11/21/21  Yes [provider]  rosuvastatin (CRESTOR) 40 MG tablet Take 40 mg by mouth daily.   Yes [provider]  senna-docusate (SENOKOT-S) 8.6-50 MG tablet Take 1 tablet by mouth 2 (two) times daily.   Yes [provider]  timolol (TIMOPTIC) 0.5 % ophthalmic solution Place 1 drop into both eyes every morning. 11/21/21  Yes [provider]  metoprolol tartrate (LOPRESSOR) 100 MG tablet Take 1 tablet (100 mg total) by mouth once for 1  dose. Take 90-120 minutes prior to scan. Patient not taking: Reported on 11/24/2021 11/07/21 11/24/21  Freada Bergeron, MD    Current Outpatient Medications  Medication Sig Dispense Refill   albuterol (PROVENTIL HFA;VENTOLIN HFA) 108 (90 BASE) MCG/ACT inhaler Inhale 2 puffs into the lungs every 6 (six) hours as needed for wheezing (cough, shortness of breath or wheezing.). 1 Inhaler 5   Alpha-Lipoic Acid 600 MG CAPS Take 600 mg by mouth daily.     Brinzolamide-Brimonidine (SIMBRINZA) 1-0.2 % SUSP Apply 1 drop to  eye in the morning, at noon, and at bedtime.     donepezil (ARICEPT) 10 MG tablet Take 10 mg by mouth at bedtime.     famotidine (PEPCID) 10 MG tablet Take 10 mg by mouth 2 (two) times daily.     hydrocortisone cream 1 % Apply 1 application topically 2 (two) times daily as needed for itching.     latanoprost (XALATAN) 0.005 % ophthalmic solution Place 1 drop into both eyes at bedtime. 2.5 mL 12   levothyroxine (SYNTHROID) 100 MCG tablet Take 1 tablet (100 mcg total) by mouth daily before breakfast.     losartan (COZAAR) 25 MG tablet Take 1 tablet (25 mg total) by mouth daily. 90 tablet 2   Multiple Vitamins-Minerals (PRESERVISION AREDS 2) CHEW Chew 1 tablet by mouth daily.     polyethylene glycol powder (GLYCOLAX/MIRALAX) powder TAKE 17 G BY MOUTH DAILY 765 g 3   ROCKLATAN 0.02-0.005 % SOLN Apply 1 drop to eye at bedtime.     rosuvastatin (CRESTOR) 40 MG tablet Take 40 mg by mouth daily.     senna-docusate (SENOKOT-S) 8.6-50 MG tablet Take 1 tablet by mouth 2 (two) times daily.     timolol (TIMOPTIC) 0.5 % ophthalmic solution Place 1 drop into both eyes every morning.     metoprolol tartrate (LOPRESSOR) 100 MG tablet Take 1 tablet (100 mg total) by mouth once for 1 dose. Take 90-120 minutes prior to scan. (Patient not taking: Reported on 11/24/2021) 1 tablet 0   No current facility-administered medications for this visit.    Allergies  Allergen Reactions   Protonix [Pantoprazole  Sodium] Cough      Review of Systems:   General:  + decreased appetite, + decreased energy, no weight gain, + weight loss, no fever  Cardiac:  + chest pain with exertion, no chest pain at rest, +SOB with mild exertion, no resting SOB, no PND, no orthopnea, no palpitations, no arrhythmia, no atrial fibrillation, no LE edema, + dizzy spells, no syncope  Respiratory:  + shortness of breath, no home oxygen, no productive cough, + dry cough, no bronchitis, no wheezing, no hemoptysis, + asthma, no pain with inspiration or cough, no sleep apnea, no CPAP at night  GI:   no difficulty swallowing, no reflux, no frequent heartburn, no hiatal hernia, + abdominal pain, no constipation, no diarrhea, no hematochezia, no hematemesis, no melena  GU:   no dysuria,  no frequency, no urinary tract infection, no hematuria, no kidney stones, no kidney disease  Vascular:  no pain suggestive of claudication, no pain in feet, no leg cramps, no varicose veins, no DVT, no non-healing foot ulcer  Neuro:   + stroke, no TIA's, no seizures, no headaches, no temporary blindness one eye,  no slurred speech, + peripheral neuropathy, no chronic pain, no instability of gait, + memory/cognitive dysfunction  Musculoskeletal: + arthritis, no joint swelling, no myalgias, no difficulty walking, + reduced mobility   Skin:   no rash, no itching, no skin infections, no pressure sores or ulcerations  Psych:   no anxiety, no depression, no nervousness, no unusual recent stress  Eyes:   no blurry vision, no floaters, no recent vision changes, + wears glasses  ENT:   no hearing loss, no loose or painful teeth, + dentures  Hematologic:  no easy bruising, no abnormal bleeding, no clotting disorder, no frequent epistaxis  Endocrine:  + diabetes, does not check CBG's at home      Physical Exam:   BP  104/66    Pulse 90    Resp 20    Ht 5' (1.524 m)    Wt 115 lb (52.2 kg)    SpO2 91% Comment: RA   BMI 22.46 kg/m   General:  Frail-appearing  elderly woman in no distress  HEENT:  Unremarkable, NCAT, PERLA, EOMI,   Neck:   no JVD, no bruits, no adenopathy   Chest:   clear to auscultation, symmetrical breath sounds, no wheezes, no rhonchi   CV:   RRR, 3/6 systolic murmur RSB, No diastolic murmur  Abdomen:  soft, non-tender, no masses   Extremities:  warm, well-perfused, pulses palpable at ankle, no lower extremity edema  Rectal/GU  Deferred  Neuro:   Flat affect and minimally conversant. Grossly non-focal and symmetrical throughout  Skin:   Clean and dry, no rashes, no breakdown  Diagnostic Tests:  ECHOCARDIOGRAM REPORT         Patient Name:   ELLANIE OPPEDISANO Date of Exam: 11/09/2021  Medical Rec #:  [id number]       Height:       61.0 in  Accession #:    [id number]      Weight:       122.8 lb  Date of Birth:  03/28/1946       BSA:          1.535 m  Patient Age:    65 years        BP:           178/98 mmHg  Patient Gender: F               HR:           63 bpm.  Exam Location:  Outpatient   Procedure: 2D Echo   Indications:    Aortic Stenosis I35.0     History:        Patient has prior history of Echocardiogram examinations,  most                  recent 01/14/2021. Risk Factors:Hypertension, Dyslipidemia  and                  Diabetes.     Sonographer:    Mikki Santee RDCS  Referring Phys: [id number] WEAVER   IMPRESSIONS     1. The aortic valve is heavily calcified and thickened with severely  restricted leaflet motion. Given valve morphology, suspect severe  paradoxical aortic stenosis with AVA 0.55, mean gradient 21mmHg, peak  gradient 13mmHg, Vmax 3.53m/s, DI 0.17. SVI low  at 31. Gradients likely underestimated due to off-axis doppler  interrogation. Consider CT for further evaluation.   2. Left ventricular ejection fraction, by estimation, is 60 to 65%. The  left ventricle has normal function. The left ventricle has no regional  wall motion abnormalities. There is mild asymmetric left ventricular   hypertrophy of the basal-septal segment.  Left ventricular diastolic parameters are consistent with Grade I  diastolic dysfunction (impaired relaxation).   3. Right ventricular systolic function is normal. The right ventricular  size is normal. There is normal pulmonary artery systolic pressure. The  estimated right ventricular systolic pressure is 76.1 mmHg.   4. The mitral valve is abnormal. Trivial mitral valve regurgitation.  Moderate mitral annular calcification.   5. There is severe calcifcation of the aortic valve. There is severe  thickening of the aortic valve. Aortic valve regurgitation is not  visualized.   6. The inferior vena cava  is normal in size with greater than 50%  respiratory variability, suggesting right atrial pressure of 3 mmHg.   Comparison(s): Compared to prior TTE in 12/2020, the aortic valve stenosis  appears worse and now suspect that there is severe, paradoxical aortic  stenosis. AVA now 0.55 (previously 0.81) with DI 0.18 (previously 0.26).  Mean gradient now 84mmHg and  previously 45mmHg.   FINDINGS   Left Ventricle: Left ventricular ejection fraction, by estimation, is 60  to 65%. The left ventricle has normal function. The left ventricle has no  regional wall motion abnormalities. The left ventricular internal cavity  size was normal in size. There is   mild asymmetric left ventricular hypertrophy of the basal-septal segment.  Left ventricular diastolic parameters are consistent with Grade I  diastolic dysfunction (impaired relaxation).   Right Ventricle: The right ventricular size is normal. No increase in  right ventricular wall thickness. Right ventricular systolic function is  normal. There is normal pulmonary artery systolic pressure. The tricuspid  regurgitant velocity is 2.34 m/s, and   with an assumed right atrial pressure of 3 mmHg, the estimated right  ventricular systolic pressure is 60.1 mmHg.   Left Atrium: Left atrial size was  normal in size.   Right Atrium: Right atrial size was normal in size.   Pericardium: There is no evidence of pericardial effusion.   Mitral Valve: The mitral valve is abnormal. There is moderate thickening  of the mitral valve leaflet(s). There is mild calcification of the mitral  valve leaflet(s). Moderate mitral annular calcification. Trivial mitral  valve regurgitation.   Tricuspid Valve: The tricuspid valve is normal in structure. Tricuspid  valve regurgitation is trivial.   Aortic Valve: The aortic valve is calcified. There is severe calcifcation  of the aortic valve. There is severe thickening of the aortic valve.  Aortic valve regurgitation is not visualized. Given valve morphology,  suspect severe (paradoxical) aortic  stenosis with AVA 0.55, mean gradient 23mmHg, peak gradient 72mmHg, Vmax  3.52m/s, DI 0.17. SVI low at 31. Gradients likely underestimated due to  off-axis doppler interrogation.      Pulmonic Valve: The pulmonic valve was not well visualized. Pulmonic valve  regurgitation is trivial.   Aorta: The aortic root is normal in size and structure.   Venous: The inferior vena cava is normal in size with greater than 50%  respiratory variability, suggesting right atrial pressure of 3 mmHg.   IAS/Shunts: No atrial level shunt detected by color flow Doppler.      LEFT VENTRICLE  PLAX 2D  LVIDd:         2.90 cm   Diastology  LVIDs:         2.00 cm   LV e' medial:    8.15 cm/s  LV PW:         1.00 cm   LV E/e' medial:  8.9  LV IVS:        0.90 cm   LV e' lateral:   8.06 cm/s  LVOT diam:     2.00 cm   LV E/e' lateral: 9.0  LV SV:         49  LV SV Index:   32  LVOT Area:     3.14 cm      RIGHT VENTRICLE  RV S prime:     7.71 cm/s  TAPSE (M-mode): 1.3 cm   LEFT ATRIUM             Index  RIGHT ATRIUM          Index  LA diam:        2.80 cm 1.82 cm/m   RA Area:     7.41 cm  LA Vol (A2C):   18.7 ml 12.18 ml/m  RA Volume:   10.50 ml 6.84 ml/m  LA  Vol (A4C):   34.5 ml 22.47 ml/m  LA Biplane Vol: 25.3 ml 16.48 ml/m   AORTIC VALVE  AV Area (Vmax):    0.60 cm  AV Area (Vmean):   0.58 cm  AV Area (VTI):     0.65 cm  AV Vmax:           318.50 cm/s  AV Vmean:          224.000 cm/s  AV VTI:            0.758 m  AV Peak Grad:      40.6 mmHg  AV Mean Grad:      23.2 mmHg  LVOT Vmax:         61.20 cm/s  LVOT Vmean:        41.500 cm/s  LVOT VTI:          0.157 m  LVOT/AV VTI ratio: 0.21     AORTA  Ao Root diam: 2.10 cm   MITRAL VALVE                TRICUSPID VALVE  MV Area (PHT): 2.87 cm     TR Peak grad:   21.9 mmHg  MV Decel Time: 264 msec     TR Vmax:        234.00 cm/s  MV E velocity: 72.80 cm/s  MV A velocity: 111.00 cm/s  SHUNTS  MV E/A ratio:  0.66         Systemic VTI:  0.16 m                              Systemic Diam: 2.00 cm   Gwyndolyn Kaufman MD  Electronically signed by Gwyndolyn Kaufman MD  Signature Date/Time: 11/09/2021/3:36:19 PM         Final     Physicians  Panel Physicians Referring Physician Case Authorizing Physician  Early Osmond, MD (Primary)     Procedures  RIGHT/LEFT HEART CATH AND CORONARY ANGIOGRAPHY   Conclusion      Mid RCA lesion is 20% stenosed.   1st Mrg lesion is 20% stenosed.   Mid Cx lesion is 30% stenosed.   1.  Moderate diffuse obstructive coronary artery disease. 2.  Relatively normal filling pressures with normal cardiac output and index. 3.  Capacious iliofemoral vessels bilaterally.   Indications  Nonrheumatic aortic valve stenosis [I35.0 (ICD-10-CM)]   Procedural Details  Technical Details The patient is a 76 year old female with a history of paradoxical low-flow low gradient aortic stenosis, type 2 diabetes, hypertension, and hyperlipidemia was evaluated in outpatient setting due to dyspnea.  She is referred for a preprocedural invasive assessment.  After obtaining consent the patient was brought to the cardiac catheterization laboratory prepped draped  sterile fashion ultrasound was used to gain access to the right radial artery and a 6 French glide sheath placed there.  5 mg of verapamil and 5000 units of heparin were administered.  Ultrasound was used to gain access to the right internal jugular vein has an antecubital IV could not be placed.  Right heart catheterization and coronary angiography  study was then performed.  At the conclusion of the procedure manual pressure was applied to the antecubital site and a TR band was placed on the radial site.   Estimated blood loss <50 mL.   During this procedure medications were administered to achieve and maintain moderate conscious sedation while the patient's heart rate, blood pressure, and oxygen saturation were continuously monitored and I was present face-to-face 100% of this time.   Medications (Filter: Administrations occurring from [phone] to 1104 on 11/30/21) Heparin (Porcine) in NaCl 2000-0.9 UNIT/L-% SOLN (mL) Total volume:  1,000 mL Date/Time Rate/Dose/Volume Action   11/30/21 0952 1,000 mL Given    midazolam (VERSED) injection (mg) Total dose:  1 mg Date/Time Rate/Dose/Volume Action   11/30/21 1016 1 mg Given    lidocaine (PF) (XYLOCAINE) 1 % injection (mL) Total volume:  7 mL Date/Time Rate/Dose/Volume Action   11/30/21 1019 2 mL Given   1035 5 mL Given    Radial Cocktail/Verapamil only (mL) Total volume:  10 mL Date/Time Rate/Dose/Volume Action   11/30/21 1020 10 mL Given    heparin sodium (porcine) injection (Units) Total dose:  5,000 Units Date/Time Rate/Dose/Volume Action   11/30/21 1020 5,000 Units Given    iohexol (OMNIPAQUE) 350 MG/ML injection (mL) Total volume:  100 mL Date/Time Rate/Dose/Volume Action   11/30/21 1046 100 mL Given    Sedation Time  Sedation Time Physician-1: 29 minutes 5 seconds Contrast  Medication Name Total Dose  iohexol (OMNIPAQUE) 350 MG/ML injection 100 mL   Radiation/Fluoro  Fluoro time: 3.2 (min) DAP: 7.3  (Gycm2) Cumulative Air Kerma: 284.1 (mGy) Complications  Complications documented before study signed (11/30/2021 11:17 AM)   RIGHT/LEFT HEART CATH AND CORONARY ANGIOGRAPHY  None Documented by Early Osmond, MD 11/30/2021 10:55 AM  Date Found: 11/30/2021  Time Range: Intraprocedure       Coronary Findings  Diagnostic Dominance: Right Left Circumflex  Mid Cx lesion is 30% stenosed.    First Obtuse Marginal Branch  1st Mrg lesion is 20% stenosed.    Right Coronary Artery  Mid RCA lesion is 20% stenosed.    Intervention   No interventions have been documented.   Coronary Diagrams  Diagnostic Dominance: Right Intervention  Implants     No implant documentation for this case.   Syngo Images   Show images for CARDIAC CATHETERIZATION Images on Long Term Storage   Show images for Mickelle, Goupil to Procedure Log  Procedure Log    Hemo Data  Flowsheet Row Most Recent Value  Fick Cardiac Output 4.03 L/min  Fick Cardiac Output Index 2.67 (L/min)/BSA  RA A Wave 7 mmHg  RA V Wave 6 mmHg  RA Mean 4 mmHg  RV Systolic Pressure 35 mmHg  RV Diastolic Pressure 1 mmHg  RV EDP 6 mmHg  PA Systolic Pressure 33 mmHg  PA Diastolic Pressure 6 mmHg  PA Mean 16 mmHg  PW A Wave 13 mmHg  PW V Wave 12 mmHg  PW Mean 10 mmHg  AO Systolic Pressure 324 mmHg  AO Diastolic Pressure 51 mmHg  AO Mean 80 mmHg  QP/QS 0.96  TPVR Index 5.99 HRUI  TSVR Index 29.96 HRUI  PVR SVR Ratio 0.08  TPVR/TSVR Ratio 0.2    Narrative & Impression  CLINICAL DATA:  Aortic Stenosis   EXAM: Cardiac TAVR CT   TECHNIQUE: The patient was scanned on a Siemens Force 401 slice scanner. A 120 kV retrospective scan was triggered in the ascending thoracic aorta at 140 HU's. Gantry  rotation speed was 250 msecs and collimation was .6 mm. No beta blockade or nitro were given. The 3D data set was reconstructed in 5% intervals of the R-R cycle. Systolic and diastolic phases were analyzed on a  dedicated work station using MPR, MIP and VRT modes. The patient received 80 cc of contrast.   FINDINGS: Aortic Valve: Calcified tri leaflet AV with score 1289   Aorta: No aneurysm normal arch vessels Moderate calcific atherosclerosis Tortuous descending thoracic aorta   Sino-tubular Junction: 22.8 mm   Ascending Thoracic Aorta: 28 mm   Aortic Arch: 22 mm   Descending Thoracic Aorta: 18 mm   Sinus of Valsalva Measurements:   Non-coronary: 22 mm   height 15.3 mm   Right - coronary: 24.7 mm  height 15 mm   Left -   coronary: 24.7   height 14.9 mm   Coronary Artery Height above Annulus:   Left Main: 10.5 mm above annulus   Right Coronary: 11.6 mm above annulus   Virtual Basal Annulus Measurements:   Maximum / Minimum Diameter: 23.8 mm x 19.8 mm   Area : 369 mm2   Perimeter : 69.2 mm   Coronary Arteries: Sufficient height above annulus for deployment   Optimum Fluoroscopic Angle for Delivery: LAO 18 Caudal 10 degrees   IMPRESSION: 1. Tri leaflet AV calcified with restricted motion annular area 369 suitable for a 23 mm Sapien 3 valve   2. Optimum angiographic angle for deployment LAO 18 Caudal 10 degrees   3. Normal ascending thoracic aorta 2.8 cm with tortuous descending thoracic aorta   4.  Coronary arteries sufficient height above annulus for deplpyment   Jenkins Rouge     Electronically Signed   By: Jenkins Rouge M.D.   On: 11/18/2021 12:54      Narrative & Impression  CLINICAL DATA:  76 year old female with history of severe aortic stenosis. Preprocedural study prior to potential transcatheter aortic valve replacement (TAVR) procedure.   EXAM: CT ANGIOGRAPHY CHEST, ABDOMEN AND PELVIS   TECHNIQUE: Multidetector CT imaging through the chest, abdomen and pelvis was performed using the standard protocol during bolus administration of intravenous contrast. Multiplanar reconstructed images and MIPs were obtained and reviewed to evaluate the  vascular anatomy.   CONTRAST:  137mL OMNIPAQUE IOHEXOL 350 MG/ML SOLN   COMPARISON:  Chest CT 07/21/2011. No prior CT of the abdomen and pelvis.   FINDINGS: CTA CHEST FINDINGS   Cardiovascular: Heart size is normal. There is no significant pericardial fluid, thickening or pericardial calcification. There is aortic atherosclerosis, as well as atherosclerosis of the great vessels of the mediastinum and the coronary arteries, including calcified atherosclerotic plaque in the left main, left anterior descending, left circumflex and right coronary arteries. Thickening and calcification of the aortic valve.   Mediastinum/Lymph Nodes: No pathologically enlarged mediastinal or hilar lymph nodes. Small hiatal hernia. No axillary lymphadenopathy.   Lungs/Pleura: No suspicious pulmonary nodules or masses are noted. No acute consolidative airspace disease. No pleural effusions. Mild scarring in the apex of the left upper lobe.   Musculoskeletal/Soft Tissues: There are no aggressive appearing lytic or blastic lesions noted in the visualized portions of the skeleton. Multiple old healed left-sided posterolateral rib fractures are incidentally noted.   CTA ABDOMEN AND PELVIS FINDINGS   Hepatobiliary: No suspicious cystic or solid hepatic lesions. No intra or extrahepatic biliary ductal dilatation. Gallbladder is normal in appearance.   Pancreas: No pancreatic mass. No pancreatic ductal dilatation. No pancreatic or peripancreatic fluid collections or inflammatory  changes.   Spleen: Unremarkable.   Adrenals/Urinary Tract: Bilateral kidneys and adrenal glands are normal in appearance. No hydroureteronephrosis. Urinary bladder is nearly completely decompressed, but otherwise unremarkable in appearance.   Stomach/Bowel: Normal appearance of the stomach. No pathologic dilatation of small bowel or colon. Numerous colonic diverticulae are noted, particularly in the descending colon and  proximal sigmoid colon, without surrounding inflammatory changes to suggest an acute diverticulitis at this time. The appendix is not confidently identified and may be surgically absent. Regardless, there are no inflammatory changes noted adjacent to the cecum to suggest the presence of an acute appendicitis at this time.   Vascular/Lymphatic: Aortic atherosclerosis, without evidence of aneurysm or dissection in the abdominal or pelvic vasculature. Vascular findings and measurements pertinent to potential TAVR procedure, as detailed below. No lymphadenopathy noted in the abdomen or pelvis.   Reproductive: Uterus and ovaries are a trophic.   Other: No significant volume of ascites.  No pneumoperitoneum.   Musculoskeletal: There are no aggressive appearing lytic or blastic lesions noted in the visualized portions of the skeleton.   VASCULAR MEASUREMENTS PERTINENT TO TAVR:   AORTA:   Minimal Aortic Diameter-11 x 9 mm   Severity of Aortic Calcification-moderate to severe   RIGHT PELVIS:   Right Common Iliac Artery -   Minimal Diameter-8.6 x 7.9 mm   Tortuosity - mild   Calcification-moderate   Right External Iliac Artery -   Minimal Diameter-6.1 x 5.9 mm   Tortuosity - mild   Calcification-none   Right Common Femoral Artery -   Minimal Diameter-5.9 x 6.0 mm   Tortuosity - mild   Calcification-none   LEFT PELVIS:   Left Common Iliac Artery -   Minimal Diameter-9.5 x 8.6 mm   Tortuosity - mild   Calcification-mild   Left External Iliac Artery -   Minimal Diameter-6.1 x 6.2 mm   Tortuosity - mild   Calcification-none   Left Common Femoral Artery -   Minimal Diameter-5.7 x 5.4 mm   Tortuosity-mild   Calcification-none   Review of the MIP images confirms the above findings.   IMPRESSION: 1. Vascular findings and measurements pertinent to potential TAVR procedure, as detailed above. 2. Severe thickening and calcification of the aortic  valve, compatible with reported clinical history of severe aortic stenosis. 3. Aortic atherosclerosis, in addition to left main and three-vessel coronary artery disease. 4. Colonic diverticulosis without evidence of acute diverticulitis at this time. 5. Additional incidental findings, as above.     Electronically Signed   By: Vinnie Langton M.D.   On: 11/18/2021 14:02      STS Risk Calculator:  Risk of Mortality:  3.904%  Renal Failure:  1.582%  Permanent Stroke:  2.615%  Prolonged Ventilation:  7.061%  DSW Infection:  0.102%  Reoperation:  3.142%  Morbidity or Mortality:  11.720%  Short Length of Stay:  28.372%  Long Length of Stay:  5.608%    Impression:  This 76 year old woman has stage D, severe, symptomatic aortic stenosis with New York Heart Association class 2-3 symptoms of exertional fatigue and shortness of breath consistent with chronic diastolic congestive heart failure.  According to her family today she is not doing much at home except laying in bed and requires a lot of encouragement to eat and get up to go to the bathroom.  It sounds like this has been going on for some time but recently worsening.  She has some baseline dementia and it  is not clear to me how  much of her failure to thrive is due to progression of her dementia and how much is due to severe aortic stenosis.  Her daughter said that she got up out of bed herself today and got dressed with assistance for her appointment with me.  I have personally reviewed her 2D echocardiogram, cardiac catheterization, and CTA studies.  Her echo shows a severely calcified and thickened aortic valve with poor mobility of the leaflets.  Mean gradient is only 27 mmHg but aortic valve area was measured at 0.6 cm with a dimensionless index of 0.17.  It was felt the Doppler may have been off axis accounting for underestimation of her gradient.  Her valve certainly looks severely stenotic.  Stroke-volume index is low.   Cardiac catheterization shows mild nonobstructive coronary disease.  I do not think she is a candidate for open surgical aortic valve replacement at her age with dementia and her level of functioning at the present time.  I think it is reasonable to offer her TAVR as an alternative for treatment of her aortic stenosis to see if that improves her level of functioning and mental status.  Her gated cardiac CTA shows anatomy suitable for TAVR using a SAPIEN 3 valve.  Her abdominal and pelvic CTA shows adequate pelvic vascular anatomy to allow transfemoral insertion.  The patient and her daughter and daughter-in-law were counseled at length regarding treatment alternatives for management of severe symptomatic aortic stenosis. The risks and benefits of surgical intervention has been discussed in detail. Long-term prognosis with medical therapy was discussed. Alternative approaches such as conventional surgical aortic valve replacement, transcatheter aortic valve replacement, and palliative medical therapy were compared and contrasted at length. This discussion was placed in the context of the patient's own specific clinical presentation and past medical history. All of their questions have been addressed.   Following the decision to proceed with transcatheter aortic valve replacement, a discussion was held regarding what types of management strategies would be attempted intraoperatively in the event of life-threatening complications, including whether or not the patient would be considered a candidate for the use of cardiopulmonary bypass and/or conversion to open sternotomy for attempted surgical intervention.  I do not think she is a candidate for emergent sternotomy to manage any intraoperative complications given her advanced age with dementia and her present level of functioning.  The patient is aware of the fact that transient use of cardiopulmonary bypass may be necessary. The patient has been advised of a  variety of complications that might develop including but not limited to risks of death, stroke, paravalvular leak, aortic dissection or other major vascular complications, aortic annulus rupture, device embolization, cardiac rupture or perforation, mitral regurgitation, acute myocardial infarction, arrhythmia, heart block or bradycardia requiring permanent pacemaker placement, congestive heart failure, respiratory failure, renal failure, pneumonia, infection, other late complications related to structural valve deterioration or migration, or other complications that might ultimately cause a temporary or permanent loss of functional independence or other long term morbidity.  I also stressed the possibility that this may not improve her mental status and activity level despite correcting her severe aortic stenosis.  The patient and her daughter provide full informed consent for the procedure as described and all questions were answered.       She will be scheduled for transfemoral TAVR using a SAPIEN 3 valve in the near future.  I spent 60 minutes performing this consultation and > 50% of this time was spent face to face counseling and coordinating the  care of this patient's severe symptomatic aortic stenosis.   [name], MD 01/05/2022

## 2022-01-09 ENCOUNTER — Ambulatory Visit (HOSPITAL_COMMUNITY): Admission: RE | Admit: 2022-01-09 | Payer: Medicare (Managed Care) | Source: Ambulatory Visit

## 2022-01-09 ENCOUNTER — Encounter (HOSPITAL_COMMUNITY): Payer: Self-pay

## 2022-01-09 ENCOUNTER — Encounter (HOSPITAL_COMMUNITY)
Admission: RE | Admit: 2022-01-09 | Discharge: 2022-01-09 | Disposition: A | Discharge status: Home / Self Care | Payer: Medicare (Managed Care) | Source: Ambulatory Visit | Attending: Cardiovascular Disease | Admitting: Cardiovascular Disease

## 2022-01-09 ENCOUNTER — Other Ambulatory Visit: Payer: Self-pay

## 2022-01-09 VITALS — BP 123/74 | HR 68 | Temp 98.7°F | Resp 18 | Ht 59.0 in | Wt 115.0 lb

## 2022-01-09 DIAGNOSIS — Z20822 Contact with and (suspected) exposure to covid-19: Secondary | ICD-10-CM | POA: Insufficient documentation

## 2022-01-09 DIAGNOSIS — I350 Nonrheumatic aortic (valve) stenosis: Secondary | ICD-10-CM

## 2022-01-09 DIAGNOSIS — Z01818 Encounter for other preprocedural examination: Secondary | ICD-10-CM

## 2022-01-09 HISTORY — DX: Unspecified dementia, unspecified severity, without behavioral disturbance, psychotic disturbance, mood disturbance, and anxiety: F03.90

## 2022-01-09 HISTORY — DX: Myoneural disorder, unspecified: G70.9

## 2022-01-09 HISTORY — DX: Presence of dental prosthetic device (complete) (partial): Z97.2

## 2022-01-09 HISTORY — DX: Unspecified hearing loss, unspecified ear: H91.90

## 2022-01-09 HISTORY — DX: Complete loss of teeth, unspecified cause, unspecified class: K08.109

## 2022-01-09 HISTORY — DX: Presence of spectacles and contact lenses: Z97.3

## 2022-01-09 HISTORY — DX: Depression, unspecified: F32.A

## 2022-01-09 HISTORY — DX: Hypothyroidism, unspecified: E03.9

## 2022-01-09 HISTORY — DX: Unspecified glaucoma: H40.9

## 2022-01-09 LAB — BLOOD GAS, ARTERIAL
Acid-Base Excess: 7.3 mmol/L — ABNORMAL HIGH (ref 0.0–2.0)
Bicarbonate: 32.4 mmol/L — ABNORMAL HIGH (ref 20.0–28.0)
FIO2: 21
O2 Saturation: 92.2 %
Patient temperature: 37
pCO2 arterial: 56.5 mmHg — ABNORMAL HIGH (ref 32.0–48.0)
pH, Arterial: 7.377 (ref 7.350–7.450)
pO2, Arterial: 65.4 mmHg — ABNORMAL LOW (ref 83.0–108.0)

## 2022-01-09 LAB — GLUCOSE, CAPILLARY: Glucose-Capillary: 108 mg/dL — ABNORMAL HIGH (ref 70–99)

## 2022-01-09 LAB — COMPREHENSIVE METABOLIC PANEL
ALT: 24 U/L (ref 0–44)
AST: 31 U/L (ref 15–41)
Albumin: 3.4 g/dL — ABNORMAL LOW (ref 3.5–5.0)
Alkaline Phosphatase: 64 U/L (ref 38–126)
Anion gap: 9 (ref 5–15)
BUN: 7 mg/dL — ABNORMAL LOW (ref 8–23)
CO2: 29 mmol/L (ref 22–32)
Calcium: 8.9 mg/dL (ref 8.9–10.3)
Chloride: 100 mmol/L (ref 98–111)
Creatinine, Ser: 0.82 mg/dL (ref 0.44–1.00)
GFR, Estimated: >60 mL/min (ref >60)
Glucose, Bld: 121 mg/dL — ABNORMAL HIGH (ref 70–99)
Potassium: 3.4 mmol/L — ABNORMAL LOW (ref 3.5–5.1)
Sodium: 138 mmol/L (ref 135–145)
Total Bilirubin: 0.9 mg/dL (ref 0.3–1.2)
Total Protein: 6.5 g/dL (ref 6.5–8.1)

## 2022-01-09 LAB — BRAIN NATRIURETIC PEPTIDE: B Natriuretic Peptide: 97.8 pg/mL (ref 0.0–100.0)

## 2022-01-09 LAB — TYPE AND SCREEN
ABO/RH(D): A POS
Antibody Screen: NEGATIVE

## 2022-01-09 LAB — CBC
HCT: 45.2 % (ref 36.0–46.0)
Hemoglobin: 15.2 g/dL — ABNORMAL HIGH (ref 12.0–15.0)
MCH: 26.6 pg (ref 26.0–34.0)
MCHC: 33.6 g/dL (ref 30.0–36.0)
MCV: 79.2 fL — ABNORMAL LOW (ref 80.0–100.0)
Platelets: 165 10*3/uL (ref 150–400)
RBC: 5.71 MIL/uL — ABNORMAL HIGH (ref 3.87–5.11)
RDW: 13.4 % (ref 11.5–15.5)
WBC: 3.5 10*3/uL — ABNORMAL LOW (ref 4.0–10.5)
nRBC: 0 % (ref 0.0–0.2)

## 2022-01-09 LAB — PROTIME-INR
INR: 1 (ref 0.8–1.2)
Prothrombin Time: 13.2 seconds (ref 11.4–15.2)

## 2022-01-09 LAB — SARS CORONAVIRUS 2 (TAT 6-24 HRS): SARS Coronavirus 2: NEGATIVE

## 2022-01-09 LAB — SURGICAL PCR SCREEN
MRSA, PCR: POSITIVE — AB
Staphylococcus aureus: POSITIVE — AB

## 2022-01-09 MED ORDER — HEPARIN 30,000 UNITS/1000 ML (OHS) CELLSAVER SOLUTION
Status: DC
  Filled 2022-01-09: qty 1000

## 2022-01-09 MED ORDER — NOREPINEPHRINE 4 MG/250ML-% IV SOLN
0.0000 ug/min | INTRAVENOUS | Status: AC
  Administered 2022-01-10: 12:00:00 1 ug/min via INTRAVENOUS
  Filled 2022-01-09: qty 250

## 2022-01-09 MED ORDER — POTASSIUM CHLORIDE 2 MEQ/ML IV SOLN
80.0000 meq | INTRAVENOUS | Status: DC
  Filled 2022-01-09: qty 40

## 2022-01-09 MED ORDER — DEXMEDETOMIDINE HCL IN NACL 400 MCG/100ML IV SOLN
0.1000 ug/kg/h | INTRAVENOUS | Status: AC
  Administered 2022-01-10: 12:00:00 26.12 ug via INTRAVENOUS
  Administered 2022-01-10: 12:00:00 1 ug/kg/h via INTRAVENOUS
  Filled 2022-01-09: qty 100

## 2022-01-09 MED ORDER — CEFAZOLIN SODIUM-DEXTROSE 2-4 GM/100ML-% IV SOLN
2.0000 g | INTRAVENOUS | Status: AC
  Administered 2022-01-10: 12:00:00 2 g via INTRAVENOUS
  Filled 2022-01-09: qty 100

## 2022-01-09 MED ORDER — MAGNESIUM SULFATE 50 % IJ SOLN
40.0000 meq | INTRAMUSCULAR | Status: DC
  Filled 2022-01-09: qty 9.85

## 2022-01-09 NOTE — H&P (Signed)
[address]       [place] [address]             [phone]      Cardiothoracic Surgery Admission History and Physical  PCP is [name], MD Referring Provider is Lenna Sciara, MD Primary Cardiologist is Freada Bergeron, MD   Reason for admission:  Severe aortic stenosis   HPI:   The patient is a 76 year old woman with a history of diabetes, hypertension, hyperlipidemia, and dementia who lives at home by herself with 24-hour care by aides and her family.  She is here today with her daughter and daughter-in-law who give most of her history.  They report increasing shortness of breath and fatigue with any activity over the past 6 to 9 months.  They said that she currently spends most of her time in bed and would not get up to move around or eat unless she has a lot of encouragement.  She has had some exertional chest pain. She has not had orthopnea or peripheral edema.  She has reported some lightheadedness and feeling like she was going to pass out with low blood pressure readings at home.  2D echocardiogram on 11/09/2021 showed severe aortic stenosis with a severely calcified and thickened aortic valve with poor leaflet mobility.  The mean gradient was 27 mmHg with a peak gradient of 46 mmHg.  Aortic valve area was 0.55 cm with a dimensionless index of 0.17 and a stroke-volume index that was low at 31.  Left ventricular ejection fraction was 60 to 65% with grade 1 diastolic dysfunction.  She underwent cardiac catheterization on 11/30/2021 showing mild diffuse nonobstructive coronary disease.  Filling pressures and cardiac index were within normal limits.       Past Medical History:  Diagnosis Date   Allergy     Anemia     Arthritis     Asthma     Diabetes mellitus      no meds   GERD (gastroesophageal reflux disease)     Hyperlipidemia     Hypertension     Low blood pressure reading     Severe aortic stenosis     Syncope             Past  Surgical History:  Procedure Laterality Date   APPENDECTOMY       COLONOSCOPY       PITUITARY SURGERY       RIGHT/LEFT HEART CATH AND CORONARY ANGIOGRAPHY N/A 11/30/2021    Procedure: RIGHT/LEFT HEART CATH AND CORONARY ANGIOGRAPHY;  Surgeon: Early Osmond, MD;  Location: [facility];  Service: Cardiovascular;  Laterality: N/A;   TUBAL LIGATION               Family History  Problem Relation Age of Onset   Stomach cancer Mother     Cancer Mother          stomach cancer   Hypertension Father     Heart disease Brother     Esophageal cancer Neg Hx     Colon polyps Neg Hx     Rectal cancer Neg Hx        Social History         Socioeconomic History   Marital status: Single      Spouse name: Not on file   Number of children: 3   Years of education: Not on file   Highest education level: Not on file  Occupational History   Occupation: Retired      Fish farm manager: UNEMPLOYED  Tobacco Use   Smoking status: Former      Types: Cigarettes      Quit date: 02/02/1969      Years since quitting: 52.9   Smokeless tobacco: Never  Vaping Use   Vaping Use: Never used  Substance and Sexual Activity   Alcohol use: No      Alcohol/week: 0.0 standard drinks   Drug use: No   Sexual activity: Never      Birth control/protection: Post-menopausal, Surgical      Comment: Tubal Ligation  Other Topics Concern   Not on file  Social History Narrative    Lives alone;    1 daughter lives in [place]    1 son lives in [place] Strain: Not on file  Food Insecurity: Not on file  Transportation Needs: Not on file  Physical Activity: Not on file  Stress: Not on file  Social Connections: Not on file  Intimate Partner Violence: Not on file             Prior to Admission medications   Medication Sig Start Date End Date Taking? Authorizing Provider  albuterol (PROVENTIL HFA;VENTOLIN HFA) 108 (90 BASE) MCG/ACT inhaler Inhale 2 puffs  into the lungs every 6 (six) hours as needed for wheezing (cough, shortness of breath or wheezing.). 12/16/14   Yes Barton Fanny, MD  Alpha-Lipoic Acid 600 MG CAPS Take 600 mg by mouth daily.     Yes [provider]  Brinzolamide-Brimonidine (SIMBRINZA) 1-0.2 % SUSP Apply 1 drop to eye in the morning, at noon, and at bedtime. 11/16/21   Yes Freada Bergeron, MD  donepezil (ARICEPT) 10 MG tablet Take 10 mg by mouth at bedtime.     Yes [provider]  famotidine (PEPCID) 10 MG tablet Take 10 mg by mouth 2 (two) times daily.     Yes [provider]  hydrocortisone cream 1 % Apply 1 application topically 2 (two) times daily as needed for itching.     Yes [provider]  latanoprost (XALATAN) 0.005 % ophthalmic solution Place 1 drop into both eyes at bedtime. 11/16/21   Yes Freada Bergeron, MD  levothyroxine (SYNTHROID) 100 MCG tablet Take 1 tablet (100 mcg total) by mouth daily before breakfast. 11/16/21   Yes Pemberton, Greer Ee, MD  losartan (COZAAR) 25 MG tablet Take 1 tablet (25 mg total) by mouth daily. 11/07/21   Yes Freada Bergeron, MD  Multiple Vitamins-Minerals (PRESERVISION AREDS 2) CHEW Chew 1 tablet by mouth daily.     Yes [provider]  polyethylene glycol powder (GLYCOLAX/MIRALAX) powder TAKE 17 G BY MOUTH DAILY 06/29/16   Yes Milus Banister, MD  ROCKLATAN 0.02-0.005 % SOLN Apply 1 drop to eye at bedtime. 11/21/21   Yes [provider]  rosuvastatin (CRESTOR) 40 MG tablet Take 40 mg by mouth daily.     Yes [provider]  senna-docusate (SENOKOT-S) 8.6-50 MG tablet Take 1 tablet by mouth 2 (two) times daily.     Yes [provider]  timolol (TIMOPTIC) 0.5 % ophthalmic solution Place 1 drop into both eyes every morning. 11/21/21   Yes [provider]  metoprolol tartrate (LOPRESSOR) 100 MG tablet Take 1 tablet (100 mg total) by mouth once for 1 dose. Take 90-120 minutes prior to  scan. Patient not taking: Reported on  11/24/2021 11/07/21 11/24/21   Freada Bergeron, MD            Current Outpatient Medications  Medication Sig Dispense Refill   albuterol (PROVENTIL HFA;VENTOLIN HFA) 108 (90 BASE) MCG/ACT inhaler Inhale 2 puffs into the lungs every 6 (six) hours as needed for wheezing (cough, shortness of breath or wheezing.). 1 Inhaler 5   Alpha-Lipoic Acid 600 MG CAPS Take 600 mg by mouth daily.       Brinzolamide-Brimonidine (SIMBRINZA) 1-0.2 % SUSP Apply 1 drop to eye in the morning, at noon, and at bedtime.       donepezil (ARICEPT) 10 MG tablet Take 10 mg by mouth at bedtime.       famotidine (PEPCID) 10 MG tablet Take 10 mg by mouth 2 (two) times daily.       hydrocortisone cream 1 % Apply 1 application topically 2 (two) times daily as needed for itching.       latanoprost (XALATAN) 0.005 % ophthalmic solution Place 1 drop into both eyes at bedtime. 2.5 mL 12   levothyroxine (SYNTHROID) 100 MCG tablet Take 1 tablet (100 mcg total) by mouth daily before breakfast.       losartan (COZAAR) 25 MG tablet Take 1 tablet (25 mg total) by mouth daily. 90 tablet 2   Multiple Vitamins-Minerals (PRESERVISION AREDS 2) CHEW Chew 1 tablet by mouth daily.       polyethylene glycol powder (GLYCOLAX/MIRALAX) powder TAKE 17 G BY MOUTH DAILY 765 g 3   ROCKLATAN 0.02-0.005 % SOLN Apply 1 drop to eye at bedtime.       rosuvastatin (CRESTOR) 40 MG tablet Take 40 mg by mouth daily.       senna-docusate (SENOKOT-S) 8.6-50 MG tablet Take 1 tablet by mouth 2 (two) times daily.       timolol (TIMOPTIC) 0.5 % ophthalmic solution Place 1 drop into both eyes every morning.       metoprolol tartrate (LOPRESSOR) 100 MG tablet Take 1 tablet (100 mg total) by mouth once for 1 dose. Take 90-120 minutes prior to scan. (Patient not taking: Reported on 11/24/2021) 1 tablet 0    No current facility-administered medications for this visit.          Allergies  Allergen Reactions   Protonix  [Pantoprazole Sodium] Cough          Review of Systems:               General:                      + decreased appetite, + decreased energy, no weight gain, + weight loss, no fever             Cardiac:                       + chest pain with exertion, no chest pain at rest, +SOB with mild exertion, no resting SOB, no PND, no orthopnea, no palpitations, no arrhythmia, no atrial fibrillation, no LE edema, + dizzy spells, no syncope             Respiratory:                 + shortness of breath, no home oxygen, no productive cough, + dry cough, no bronchitis, no wheezing, no hemoptysis, + asthma, no pain with inspiration or cough, no sleep apnea, no CPAP at night  GI:                               no difficulty swallowing, no reflux, no frequent heartburn, no hiatal hernia, + abdominal pain, no constipation, no diarrhea, no hematochezia, no hematemesis, no melena             GU:                              no dysuria,  no frequency, no urinary tract infection, no hematuria, no kidney stones, no kidney disease             Vascular:                     no pain suggestive of claudication, no pain in feet, no leg cramps, no varicose veins, no DVT, no non-healing foot ulcer             Neuro:                         + stroke, no TIA's, no seizures, no headaches, no temporary blindness one eye,  no slurred speech, + peripheral neuropathy, no chronic pain, no instability of gait, + memory/cognitive dysfunction             Musculoskeletal:         + arthritis, no joint swelling, no myalgias, no difficulty walking, + reduced mobility              Skin:                            no rash, no itching, no skin infections, no pressure sores or ulcerations             Psych:                         no anxiety, no depression, no nervousness, no unusual recent stress             Eyes:                           no blurry vision, no floaters, no recent vision changes, + wears glasses             ENT:                             no hearing loss, no loose or painful teeth, + dentures             Hematologic:               no easy bruising, no abnormal bleeding, no clotting disorder, no frequent epistaxis             Endocrine:                   + diabetes, does not check CBG's at home                              Physical Exam:               BP 104/66    Pulse  90    Resp 20    Ht 5' (1.524 m)    Wt 115 lb (52.2 kg)    SpO2 91% Comment: RA   BMI 22.46 kg/m              General:                      Frail-appearing elderly woman in no distress             HEENT:                       Unremarkable, NCAT, PERLA, EOMI,              Neck:                           no JVD, no bruits, no adenopathy              Chest:                          clear to auscultation, symmetrical breath sounds, no wheezes, no rhonchi              CV:                              RRR, 3/6 systolic murmur RSB, No diastolic murmur             Abdomen:                    soft, non-tender, no masses              Extremities:                 warm, well-perfused, pulses palpable at ankle, no lower extremity edema             Rectal/GU                   Deferred             Neuro:                         Flat affect and minimally conversant. Grossly non-focal and symmetrical throughout             Skin:                            Clean and dry, no rashes, no breakdown   Diagnostic Tests:   ECHOCARDIOGRAM REPORT         Patient Name:   Mandy Webb Date of Exam: 11/09/2021  Medical Rec #:  [id number]       Height:       61.0 in  Accession #:    [id number]      Weight:       122.8 lb  Date of Birth:  [birth year]       BSA:          1.535 m  Patient Age:    48 years        BP:           178/98 mmHg  Patient Gender: F               HR:  63 bpm.  Exam Location:  Outpatient   Procedure: 2D Echo   Indications:    Aortic Stenosis I35.0     History:        Patient has prior history of Echocardiogram examinations,   most                  recent 01/14/2021. Risk Factors:Hypertension, Dyslipidemia  and                  Diabetes.     Sonographer:    Mikki Santee RDCS  Referring Phys: [id number] WEAVER   IMPRESSIONS     1. The aortic valve is heavily calcified and thickened with severely  restricted leaflet motion. Given valve morphology, suspect severe  paradoxical aortic stenosis with AVA 0.55, mean gradient 80mmHg, peak  gradient 69mmHg, Vmax 3.89m/s, DI 0.17. SVI low  at 31. Gradients likely underestimated due to off-axis doppler  interrogation. Consider CT for further evaluation.   2. Left ventricular ejection fraction, by estimation, is 60 to 65%. The  left ventricle has normal function. The left ventricle has no regional  wall motion abnormalities. There is mild asymmetric left ventricular  hypertrophy of the basal-septal segment.  Left ventricular diastolic parameters are consistent with Grade I  diastolic dysfunction (impaired relaxation).   3. Right ventricular systolic function is normal. The right ventricular  size is normal. There is normal pulmonary artery systolic pressure. The  estimated right ventricular systolic pressure is 89.2 mmHg.   4. The mitral valve is abnormal. Trivial mitral valve regurgitation.  Moderate mitral annular calcification.   5. There is severe calcifcation of the aortic valve. There is severe  thickening of the aortic valve. Aortic valve regurgitation is not  visualized.   6. The inferior vena cava is normal in size with greater than 50%  respiratory variability, suggesting right atrial pressure of 3 mmHg.   Comparison(s): Compared to prior TTE in 12/2020, the aortic valve stenosis  appears worse and now suspect that there is severe, paradoxical aortic  stenosis. AVA now 0.55 (previously 0.81) with DI 0.18 (previously 0.26).  Mean gradient now 68mmHg and  previously 63mmHg.   FINDINGS   Left Ventricle: Left ventricular ejection fraction, by  estimation, is 60  to 65%. The left ventricle has normal function. The left ventricle has no  regional wall motion abnormalities. The left ventricular internal cavity  size was normal in size. There is   mild asymmetric left ventricular hypertrophy of the basal-septal segment.  Left ventricular diastolic parameters are consistent with Grade I  diastolic dysfunction (impaired relaxation).   Right Ventricle: The right ventricular size is normal. No increase in  right ventricular wall thickness. Right ventricular systolic function is  normal. There is normal pulmonary artery systolic pressure. The tricuspid  regurgitant velocity is 2.34 m/s, and   with an assumed right atrial pressure of 3 mmHg, the estimated right  ventricular systolic pressure is 11.9 mmHg.   Left Atrium: Left atrial size was normal in size.   Right Atrium: Right atrial size was normal in size.   Pericardium: There is no evidence of pericardial effusion.   Mitral Valve: The mitral valve is abnormal. There is moderate thickening  of the mitral valve leaflet(s). There is mild calcification of the mitral  valve leaflet(s). Moderate mitral annular calcification. Trivial mitral  valve regurgitation.   Tricuspid Valve: The tricuspid valve is normal in structure. Tricuspid  valve regurgitation is trivial.   Aortic Valve: The  aortic valve is calcified. There is severe calcifcation  of the aortic valve. There is severe thickening of the aortic valve.  Aortic valve regurgitation is not visualized. Given valve morphology,  suspect severe (paradoxical) aortic  stenosis with AVA 0.55, mean gradient 73mmHg, peak gradient 44mmHg, Vmax  3.55m/s, DI 0.17. SVI low at 31. Gradients likely underestimated due to  off-axis doppler interrogation.      Pulmonic Valve: The pulmonic valve was not well visualized. Pulmonic valve  regurgitation is trivial.   Aorta: The aortic root is normal in size and structure.   Venous: The inferior  vena cava is normal in size with greater than 50%  respiratory variability, suggesting right atrial pressure of 3 mmHg.   IAS/Shunts: No atrial level shunt detected by color flow Doppler.      LEFT VENTRICLE  PLAX 2D  LVIDd:         2.90 cm   Diastology  LVIDs:         2.00 cm   LV e' medial:    8.15 cm/s  LV PW:         1.00 cm   LV E/e' medial:  8.9  LV IVS:        0.90 cm   LV e' lateral:   8.06 cm/s  LVOT diam:     2.00 cm   LV E/e' lateral: 9.0  LV SV:         49  LV SV Index:   32  LVOT Area:     3.14 cm      RIGHT VENTRICLE  RV S prime:     7.71 cm/s  TAPSE (M-mode): 1.3 cm   LEFT ATRIUM             Index        RIGHT ATRIUM          Index  LA diam:        2.80 cm 1.82 cm/m   RA Area:     7.41 cm  LA Vol (A2C):   18.7 ml 12.18 ml/m  RA Volume:   10.50 ml 6.84 ml/m  LA Vol (A4C):   34.5 ml 22.47 ml/m  LA Biplane Vol: 25.3 ml 16.48 ml/m   AORTIC VALVE  AV Area (Vmax):    0.60 cm  AV Area (Vmean):   0.58 cm  AV Area (VTI):     0.65 cm  AV Vmax:           318.50 cm/s  AV Vmean:          224.000 cm/s  AV VTI:            0.758 m  AV Peak Grad:      40.6 mmHg  AV Mean Grad:      23.2 mmHg  LVOT Vmax:         61.20 cm/s  LVOT Vmean:        41.500 cm/s  LVOT VTI:          0.157 m  LVOT/AV VTI ratio: 0.21     AORTA  Ao Root diam: 2.10 cm   MITRAL VALVE                TRICUSPID VALVE  MV Area (PHT): 2.87 cm     TR Peak grad:   21.9 mmHg  MV Decel Time: 264 msec     TR Vmax:        234.00 cm/s  MV E  velocity: 72.80 cm/s  MV A velocity: 111.00 cm/s  SHUNTS  MV E/A ratio:  0.66         Systemic VTI:  0.16 m                              Systemic Diam: 2.00 cm   Gwyndolyn Kaufman MD  Electronically signed by Gwyndolyn Kaufman MD  Signature Date/Time: 11/09/2021/3:36:19 PM         Final       Physicians   Panel Physicians Referring Physician Case Authorizing Physician  Early Osmond, MD (Primary)        Procedures   RIGHT/LEFT HEART CATH AND  CORONARY ANGIOGRAPHY    Conclusion       Mid RCA lesion is 20% stenosed.   1st Mrg lesion is 20% stenosed.   Mid Cx lesion is 30% stenosed.   1.  Moderate diffuse obstructive coronary artery disease. 2.  Relatively normal filling pressures with normal cardiac output and index. 3.  Capacious iliofemoral vessels bilaterally.   Indications   Nonrheumatic aortic valve stenosis [I35.0 (ICD-10-CM)]    Procedural Details   Technical Details The patient is a 76 year old female with a history of paradoxical low-flow low gradient aortic stenosis, type 2 diabetes, hypertension, and hyperlipidemia was evaluated in outpatient setting due to dyspnea.  She is referred for a preprocedural invasive assessment.  After obtaining consent the patient was brought to the cardiac catheterization laboratory prepped draped sterile fashion ultrasound was used to gain access to the right radial artery and a 6 French glide sheath placed there.  5 mg of verapamil and 5000 units of heparin were administered.  Ultrasound was used to gain access to the right internal jugular vein has an antecubital IV could not be placed.  Right heart catheterization and coronary angiography study was then performed.  At the conclusion of the procedure manual pressure was applied to the antecubital site and a TR band was placed on the radial site.   Estimated blood loss <50 mL.   During this procedure medications were administered to achieve and maintain moderate conscious sedation while the patient's heart rate, blood pressure, and oxygen saturation were continuously monitored and I was present face-to-face 100% of this time.    Medications (Filter: Administrations occurring from [phone] to 1104 on 11/30/21) Heparin (Porcine) in NaCl 2000-0.9 UNIT/L-% SOLN (mL) Total volume:  1,000 mL Date/Time Rate/Dose/Volume Action    11/30/21 0952 1,000 mL Given      midazolam (VERSED) injection (mg) Total dose:  1 mg Date/Time  Rate/Dose/Volume Action    11/30/21 1016 1 mg Given      lidocaine (PF) (XYLOCAINE) 1 % injection (mL) Total volume:  7 mL Date/Time Rate/Dose/Volume Action    11/30/21 1019 2 mL Given    1035 5 mL Given      Radial Cocktail/Verapamil only (mL) Total volume:  10 mL Date/Time Rate/Dose/Volume Action    11/30/21 1020 10 mL Given      heparin sodium (porcine) injection (Units) Total dose:  5,000 Units Date/Time Rate/Dose/Volume Action    11/30/21 1020 5,000 Units Given      iohexol (OMNIPAQUE) 350 MG/ML injection (mL) Total volume:  100 mL Date/Time Rate/Dose/Volume Action    11/30/21 1046 100 mL Given      Sedation Time   Sedation Time Physician-1: 29 minutes 5 seconds Contrast   Medication Name Total Dose  iohexol (OMNIPAQUE) 350 MG/ML  injection 100 mL    Radiation/Fluoro   Fluoro time: 3.2 (min) DAP: 7.3 (Gycm2) Cumulative Air Kerma: 637.8 (mGy) Complications      Complications documented before study signed (11/30/2021 11:17 AM)     RIGHT/LEFT HEART CATH AND CORONARY ANGIOGRAPHY   None Documented by Early Osmond, MD 11/30/2021 10:55 AM  Date Found: 11/30/2021  Time Range: Intraprocedure          Coronary Findings   Diagnostic Dominance: Right Left Circumflex  Mid Cx lesion is 30% stenosed.    First Obtuse Marginal Branch  1st Mrg lesion is 20% stenosed.    Right Coronary Artery  Mid RCA lesion is 20% stenosed.    Intervention    No interventions have been documented.    Coronary Diagrams   Diagnostic Dominance: Right Intervention   Implants      No implant documentation for this case.    [employer] Images    Show images for CARDIAC CATHETERIZATION Images on Long Term Storage    Show images for Aricela, Bertagnolli to Procedure Log   Procedure Log    Hemo Data   Flowsheet Row Most Recent Value  Fick Cardiac Output 4.03 L/min  Fick Cardiac Output Index 2.67 (L/min)/BSA  RA A Wave 7 mmHg  RA V Wave 6 mmHg  RA Mean 4 mmHg  RV  Systolic Pressure 35 mmHg  RV Diastolic Pressure 1 mmHg  RV EDP 6 mmHg  PA Systolic Pressure 33 mmHg  PA Diastolic Pressure 6 mmHg  PA Mean 16 mmHg  PW A Wave 13 mmHg  PW V Wave 12 mmHg  PW Mean 10 mmHg  AO Systolic Pressure 588 mmHg  AO Diastolic Pressure 51 mmHg  AO Mean 80 mmHg  QP/QS 0.96  TPVR Index 5.99 HRUI  TSVR Index 29.96 HRUI  PVR SVR Ratio 0.08  TPVR/TSVR Ratio 0.2      Narrative & Impression  CLINICAL DATA:  Aortic Stenosis   EXAM: Cardiac TAVR CT   TECHNIQUE: The patient was scanned on a Siemens Force 502 slice scanner. A 120 kV retrospective scan was triggered in the ascending thoracic aorta at 140 HU's. Gantry rotation speed was 250 msecs and collimation was .6 mm. No beta blockade or nitro were given. The 3D data set was reconstructed in 5% intervals of the R-R cycle. Systolic and diastolic phases were analyzed on a dedicated work station using MPR, MIP and VRT modes. The patient received 80 cc of contrast.   FINDINGS: Aortic Valve: Calcified tri leaflet AV with score 1289   Aorta: No aneurysm normal arch vessels Moderate calcific atherosclerosis Tortuous descending thoracic aorta   Sino-tubular Junction: 22.8 mm   Ascending Thoracic Aorta: 28 mm   Aortic Arch: 22 mm   Descending Thoracic Aorta: 18 mm   Sinus of Valsalva Measurements:   Non-coronary: 22 mm   height 15.3 mm   Right - coronary: 24.7 mm  height 15 mm   Left -   coronary: 24.7   height 14.9 mm   Coronary Artery Height above Annulus:   Left Main: 10.5 mm above annulus   Right Coronary: 11.6 mm above annulus   Virtual Basal Annulus Measurements:   Maximum / Minimum Diameter: 23.8 mm x 19.8 mm   Area : 369 mm2   Perimeter : 69.2 mm   Coronary Arteries: Sufficient height above annulus for deployment   Optimum Fluoroscopic Angle for Delivery: LAO 18 Caudal 10 degrees   IMPRESSION: 1. Tri leaflet AV calcified  with restricted motion annular area 369 suitable for a 23  mm Sapien 3 valve   2. Optimum angiographic angle for deployment LAO 18 Caudal 10 degrees   3. Normal ascending thoracic aorta 2.8 cm with tortuous descending thoracic aorta   4.  Coronary arteries sufficient height above annulus for deplpyment   Jenkins Rouge     Electronically Signed   By: Jenkins Rouge M.D.   On: 11/18/2021 12:54        Narrative & Impression  CLINICAL DATA:  76 year old female with history of severe aortic stenosis. Preprocedural study prior to potential transcatheter aortic valve replacement (TAVR) procedure.   EXAM: CT ANGIOGRAPHY CHEST, ABDOMEN AND PELVIS   TECHNIQUE: Multidetector CT imaging through the chest, abdomen and pelvis was performed using the standard protocol during bolus administration of intravenous contrast. Multiplanar reconstructed images and MIPs were obtained and reviewed to evaluate the vascular anatomy.   CONTRAST:  152mL OMNIPAQUE IOHEXOL 350 MG/ML SOLN   COMPARISON:  Chest CT 07/21/2011. No prior CT of the abdomen and pelvis.   FINDINGS: CTA CHEST FINDINGS   Cardiovascular: Heart size is normal. There is no significant pericardial fluid, thickening or pericardial calcification. There is aortic atherosclerosis, as well as atherosclerosis of the great vessels of the mediastinum and the coronary arteries, including calcified atherosclerotic plaque in the left main, left anterior descending, left circumflex and right coronary arteries. Thickening and calcification of the aortic valve.   Mediastinum/Lymph Nodes: No pathologically enlarged mediastinal or hilar lymph nodes. Small hiatal hernia. No axillary lymphadenopathy.   Lungs/Pleura: No suspicious pulmonary nodules or masses are noted. No acute consolidative airspace disease. No pleural effusions. Mild scarring in the apex of the left upper lobe.   Musculoskeletal/Soft Tissues: There are no aggressive appearing lytic or blastic lesions noted in the visualized  portions of the skeleton. Multiple old healed left-sided posterolateral rib fractures are incidentally noted.   CTA ABDOMEN AND PELVIS FINDINGS   Hepatobiliary: No suspicious cystic or solid hepatic lesions. No intra or extrahepatic biliary ductal dilatation. Gallbladder is normal in appearance.   Pancreas: No pancreatic mass. No pancreatic ductal dilatation. No pancreatic or peripancreatic fluid collections or inflammatory changes.   Spleen: Unremarkable.   Adrenals/Urinary Tract: Bilateral kidneys and adrenal glands are normal in appearance. No hydroureteronephrosis. Urinary bladder is nearly completely decompressed, but otherwise unremarkable in appearance.   Stomach/Bowel: Normal appearance of the stomach. No pathologic dilatation of small bowel or colon. Numerous colonic diverticulae are noted, particularly in the descending colon and proximal sigmoid colon, without surrounding inflammatory changes to suggest an acute diverticulitis at this time. The appendix is not confidently identified and may be surgically absent. Regardless, there are no inflammatory changes noted adjacent to the cecum to suggest the presence of an acute appendicitis at this time.   Vascular/Lymphatic: Aortic atherosclerosis, without evidence of aneurysm or dissection in the abdominal or pelvic vasculature. Vascular findings and measurements pertinent to potential TAVR procedure, as detailed below. No lymphadenopathy noted in the abdomen or pelvis.   Reproductive: Uterus and ovaries are a trophic.   Other: No significant volume of ascites.  No pneumoperitoneum.   Musculoskeletal: There are no aggressive appearing lytic or blastic lesions noted in the visualized portions of the skeleton.   VASCULAR MEASUREMENTS PERTINENT TO TAVR:   AORTA:   Minimal Aortic Diameter-11 x 9 mm   Severity of Aortic Calcification-moderate to severe   RIGHT PELVIS:   Right Common Iliac Artery -   Minimal  Diameter-8.6 x 7.9  mm   Tortuosity - mild   Calcification-moderate   Right External Iliac Artery -   Minimal Diameter-6.1 x 5.9 mm   Tortuosity - mild   Calcification-none   Right Common Femoral Artery -   Minimal Diameter-5.9 x 6.0 mm   Tortuosity - mild   Calcification-none   LEFT PELVIS:   Left Common Iliac Artery -   Minimal Diameter-9.5 x 8.6 mm   Tortuosity - mild   Calcification-mild   Left External Iliac Artery -   Minimal Diameter-6.1 x 6.2 mm   Tortuosity - mild   Calcification-none   Left Common Femoral Artery -   Minimal Diameter-5.7 x 5.4 mm   Tortuosity-mild   Calcification-none   Review of the MIP images confirms the above findings.   IMPRESSION: 1. Vascular findings and measurements pertinent to potential TAVR procedure, as detailed above. 2. Severe thickening and calcification of the aortic valve, compatible with reported clinical history of severe aortic stenosis. 3. Aortic atherosclerosis, in addition to left main and three-vessel coronary artery disease. 4. Colonic diverticulosis without evidence of acute diverticulitis at this time. 5. Additional incidental findings, as above.     Electronically Signed   By: Vinnie Langton M.D.   On: 11/18/2021 14:02        STS Risk Calculator:   Risk of Mortality:  3.904%  Renal Failure:  1.582%  Permanent Stroke:  2.615%  Prolonged Ventilation:  7.061%  DSW Infection:  0.102%  Reoperation:  3.142%  Morbidity or Mortality:  11.720%  Short Length of Stay:  28.372%  Long Length of Stay:  5.608%      Impression:   This 76 year old woman has stage D, severe, symptomatic aortic stenosis with New York Heart Association class 2-3 symptoms of exertional fatigue and shortness of breath consistent with chronic diastolic congestive heart failure.  According to her family today she is not doing much at home except laying in bed and requires a lot of encouragement to eat and get up  to go to the bathroom.  It sounds like this has been going on for some time but recently worsening.  She has some baseline dementia and it  is not clear to me how much of her failure to thrive is due to progression of her dementia and how much is due to severe aortic stenosis.  Her daughter said that she got up out of bed herself today and got dressed with assistance for her appointment with me.  I have personally reviewed her 2D echocardiogram, cardiac catheterization, and CTA studies.  Her echo shows a severely calcified and thickened aortic valve with poor mobility of the leaflets.  Mean gradient is only 27 mmHg but aortic valve area was measured at 0.6 cm with a dimensionless index of 0.17.  It was felt the Doppler may have been off axis accounting for underestimation of her gradient.  Her valve certainly looks severely stenotic.  Stroke-volume index is low.  Cardiac catheterization shows mild nonobstructive coronary disease.  I do not think she is a candidate for open surgical aortic valve replacement at her age with dementia and her level of functioning at the present time.  I think it is reasonable to offer her TAVR as an alternative for treatment of her aortic stenosis to see if that improves her level of functioning and mental status.  Her gated cardiac CTA shows anatomy suitable for TAVR using a SAPIEN 3 valve.  Her abdominal and pelvic CTA shows adequate pelvic vascular anatomy to  allow transfemoral insertion.   The patient and her daughter and daughter-in-law were counseled at length regarding treatment alternatives for management of severe symptomatic aortic stenosis. The risks and benefits of surgical intervention has been discussed in detail. Long-term prognosis with medical therapy was discussed. Alternative approaches such as conventional surgical aortic valve replacement, transcatheter aortic valve replacement, and palliative medical therapy were compared and contrasted at length. This  discussion was placed in the context of the patient's own specific clinical presentation and past medical history. All of their questions have been addressed.    Following the decision to proceed with transcatheter aortic valve replacement, a discussion was held regarding what types of management strategies would be attempted intraoperatively in the event of life-threatening complications, including whether or not the patient would be considered a candidate for the use of cardiopulmonary bypass and/or conversion to open sternotomy for attempted surgical intervention.  I do not think she is a candidate for emergent sternotomy to manage any intraoperative complications given her advanced age with dementia and her present level of functioning.  The patient is aware of the fact that transient use of cardiopulmonary bypass may be necessary. The patient has been advised of a variety of complications that might develop including but not limited to risks of death, stroke, paravalvular leak, aortic dissection or other major vascular complications, aortic annulus rupture, device embolization, cardiac rupture or perforation, mitral regurgitation, acute myocardial infarction, arrhythmia, heart block or bradycardia requiring permanent pacemaker placement, congestive heart failure, respiratory failure, renal failure, pneumonia, infection, other late complications related to structural valve deterioration or migration, or other complications that might ultimately cause a temporary or permanent loss of functional independence or other long term morbidity.  I also stressed the possibility that this may not improve her mental status and activity level despite correcting her severe aortic stenosis.  The patient and her daughter provide full informed consent for the procedure as described and all questions were answered.      Plan:   Transfemoral TAVR using a SAPIEN 3 valve.      [name], MD

## 2022-01-09 NOTE — Progress Notes (Deleted)
Cardiology Office Note:    Date:  01/09/2022   ID:  Mandy Webb, DOB October 11, 1946, MRN [id number]  PCP:  Mandy Adie, MD   [place]  Cardiologist:  Mandy Bergeron, MD  Advanced Practice Provider:  No care team member to display Electrophysiologist:  None    Referring MD: Mandy Lei, MD    History of Present Illness:    Mandy Webb is a 76 y.o. female with a hx of DMII, HTN, HLD, and moderate aortic stenosis who presents to clinic for follow-up of her moderate aortic stenosis.  Last seen by [name], PA-C on 08/19/21 where she had mild dyspnea on exertion but no chest pain, syncope or HF symptoms. TTE 01/14/21 with LVEF 60-65%, moderate aortic stenosis with mean gradient 22.2mg  and peak 3.28m/s.  She returns to clinic today given due to episode of presyncope This occurred about 2 weeks ago when she was walking at [facility] and began to felt very lightheaded. BP was "low" at that point. No associated chest pain, SOB, palpitations. They did orthostatics which were reportedly negative. Had one other prior episode of syncope a year ago while in [place]. Thought it may have been due to low blood pressure, however, was also told she may need her valve replaced. Follow-up here revealed moderate AS as detailed above and intervention was deferred.   Today, the patient denies any chest pain, SOB, lightheadedness or dizziness. Notably, her antihypertensives were stopped and her blood pressure is 170 today. She overall states she feels well. She is scheduled for TTE on Wednesday.   Past Medical History:  Diagnosis Date   Anemia    Arthritis    Asthma    Dementia ([place])    Depression    Diabetes mellitus    no meds, diet controlled   Full dentures    GERD (gastroesophageal reflux disease)    Glaucoma    bilateral eyes   Hearing loss    wears hearing aids   Hepatitis 1999   Hep A-Treated   Hyperlipidemia    Hypertension    Hypothyroidism     Low blood pressure reading    Neuromuscular disorder ([facility])    neuropathy in feet   Severe aortic stenosis    Stroke ([place]) 1999   Syncope    Wears glasses     Past Surgical History:  Procedure Laterality Date   APPENDECTOMY     COLONOSCOPY     PITUITARY SURGERY     RIGHT/LEFT HEART CATH AND CORONARY ANGIOGRAPHY N/A 11/30/2021   Procedure: RIGHT/LEFT HEART CATH AND CORONARY ANGIOGRAPHY;  Surgeon: Early Osmond, MD;  Location: [facility];  Service: Cardiovascular;  Laterality: N/A;   TUBAL LIGATION      Current Medications: No outpatient medications have been marked as taking for the 01/13/22 encounter (Appointment) with Mandy Bergeron, MD.     Allergies:   Protonix [pantoprazole sodium]   Social History   Socioeconomic History   Marital status: Single    Spouse name: Not on file   Number of children: 3   Years of education: Not on file   Highest education level: Not on file  Occupational History   Occupation: Retired    Fish farm manager: UNEMPLOYED  Tobacco Use   Smoking status: Former    Types: Cigarettes    Quit date: 02/02/1969    Years since quitting: 52.9   Smokeless tobacco: Never  Vaping Use   Vaping Use: Never used  Substance  and Sexual Activity   Alcohol use: No    Alcohol/week: 0.0 standard drinks   Drug use: No   Sexual activity: Never    Birth control/protection: Post-menopausal, Surgical    Comment: Tubal Ligation  Other Topics Concern   Not on file  Social History Narrative   Lives alone;   1 daughter lives in [place]   1 son lives in [place]   Social Determinants of Health   Financial Resource Strain: Not on file  Food Insecurity: Not on file  Transportation Needs: Not on file  Physical Activity: Not on file  Stress: Not on file  Social Connections: Not on file     Family History: The patient's family history includes Cancer in her mother; Heart disease in her brother; Hypertension in her father; Stomach cancer in her mother.  There is no history of Esophageal cancer, Colon polyps, or Rectal cancer.  ROS:   Please see the history of present illness.    Review of Systems  Constitutional:  Negative for chills and fever.  HENT:  Negative for sore throat.   Eyes:  Negative for blurred vision and redness.  Respiratory:  Negative for shortness of breath.   Cardiovascular:  Negative for chest pain, palpitations, orthopnea, claudication, leg swelling and PND.  Gastrointestinal:  Negative for melena, nausea and vomiting.  Genitourinary:  Negative for dysuria.  Musculoskeletal:  Positive for joint pain and myalgias.  Neurological:  Positive for dizziness. Negative for loss of consciousness.  Endo/Heme/Allergies:  Negative for polydipsia.  Psychiatric/Behavioral:  Negative for substance abuse.    EKGs/Labs/Other Studies Reviewed:    The following studies were reviewed today: TTE 01/27/21: IMPRESSIONS   1. Moderate aortic valve stenosis.   2. Left ventricular ejection fraction, by estimation, is 60 to 65%. The  left ventricle has normal function. The left ventricle has no regional  wall motion abnormalities. Left ventricular diastolic parameters are  consistent with Grade I diastolic  dysfunction (impaired relaxation). The average left ventricular global  longitudinal strain is -21.5 %. The global longitudinal strain is normal.   3. Right ventricular systolic function is normal. The right ventricular  size is normal. There is normal pulmonary artery systolic pressure.   4. The mitral valve is normal in structure. Mild to moderate mitral valve  regurgitation. No evidence of mitral stenosis.   5. The aortic valve is calcified. There is moderate calcification of the  aortic valve. There is moderate thickening of the aortic valve. Aortic  valve regurgitation is not visualized. Moderate aortic valve stenosis.  Aortic valve mean gradient measures  22.2 mmHg. Aortic valve Vmax measures 3.08 m/s.   6. The inferior vena  cava is normal in size with greater than 50%  respiratory variability, suggesting right atrial pressure of 3 mmHg.   EKG:  EKG is  ordered today.  The ekg ordered today demonstrates NSR with TWI inferolaterally, HR 69  Recent Labs: 01/09/2022: ALT 24; B Natriuretic Peptide 97.8; BUN 7; Creatinine, Ser 0.82; Hemoglobin 15.2; Platelets 165; Potassium 3.4; Sodium 138  Recent Lipid Panel    Component Value Date/Time   CHOL 193 08/19/2021 1108   TRIG 135 08/19/2021 1108   HDL 65 08/19/2021 1108   CHOLHDL 3.0 08/19/2021 1108   CHOLHDL 3.6 12/16/2014 1005   VLDL 33 12/16/2014 1005   LDLCALC 104 (H) 08/19/2021 1108   LDLDIRECT 89 03/23/2015 1620     Physical Exam:    VS:  There were no vitals taken for this visit.  Wt Readings from Last 3 Encounters:  01/09/22 115 lb (52.2 kg)  01/05/22 115 lb (52.2 kg)  11/30/21 122 lb (55.3 kg)     GEN:  Comfortable, NAD HEENT: Normal NECK: No JVD; No carotid bruits CARDIAC: RRR, 3/6 harsh nearly holosytolic murmur that radiates to the carotids. No rubs, gallops RESPIRATORY:  CTAB ABDOMEN: Soft, non-tender, non-distended MUSCULOSKELETAL:  No edema; No deformity  SKIN: Warm and dry NEUROLOGIC:  Alert and oriented x 3 PSYCHIATRIC:  Normal affect   ASSESSMENT:    No diagnosis found.  PLAN:    In order of problems listed above:  #Syncope/Presyncope: Patient with intermittent episodes of dizziness and two episodes of pre-syncope/syncope over the past year. TTE 01/14/21 with LVEF 55-60%. Moderate aortic valve stenosis withmean gradient measures 22.2 mmHg. Vmax.08 m/s. Per report, blood pressure was low during these episodes and therefore she has been weaned off her antihypertensives. Given underlying AS, however, concern that her symptoms could represent progression of disease. Will repeat TTE. -Plan for TTE this Wednesday -Will check coronary CTA with TAVR protocol to assess calcium score of the valve to help determine severity of disease.  Also to assess coronary arteries given ECG changes as below -Continue hydration -Will start low dose losartan 25mg  daily given SBP 170 today  #TWI inferolaterally: New TWI on ECG today. Patient is asymptomatic with no chest pain, SOB, lightheadedness or dizziness. ? Due to hypertension. Will need ischemic evaluation.  -Check trops today; patient is denying symptoms currently -Shared decision making about checking coronary CTA to evaluate both the valve and coronaries    #Moderate AS: TTE 01/14/21 with LVEF 55-60%. Moderate aortic valve stenosis with mean gradient measures 22.2 mmHg. Vmax.08 m/s. Given presyncopal episodes, will repeat TTE as above to assess for interval progression of AS. Will also check CTA to assess valve further. -Check TTE -Coronary CTA as above to evaluate both aortic valve and coronaries  #HTN: Elevated today.  -Start low dose losartan 25mg  daily -BMET next week  #HLD: -Continue atorvastatin 40mg  daily  #DMII: -Continue insulin and metformin   Medication Adjustments/Labs and Tests Ordered: Current medicines are reviewed at length with the patient today.  Concerns regarding medicines are outlined above.  No orders of the defined types were placed in this encounter.  No orders of the defined types were placed in this encounter.   There are no Patient Instructions on file for this visit.    Signed, Mandy Bergeron, MD  01/09/2022 3:29 PM    [place]

## 2022-01-10 ENCOUNTER — Other Ambulatory Visit: Payer: Self-pay

## 2022-01-10 ENCOUNTER — Inpatient Hospital Stay (HOSPITAL_COMMUNITY): Payer: Medicare (Managed Care) | Admitting: Physician Assistant

## 2022-01-10 ENCOUNTER — Inpatient Hospital Stay (HOSPITAL_COMMUNITY)
Admission: RE | Admit: 2022-01-10 | Discharge: 2022-01-12 | DRG: 267 | Disposition: A | Discharge status: Home / Self Care | Payer: Medicare (Managed Care) | Attending: Surgery | Admitting: Surgery

## 2022-01-10 ENCOUNTER — Inpatient Hospital Stay (HOSPITAL_COMMUNITY): Payer: Medicare (Managed Care)

## 2022-01-10 ENCOUNTER — Inpatient Hospital Stay (HOSPITAL_COMMUNITY): Payer: Medicare (Managed Care) | Admitting: Anesthesiology

## 2022-01-10 ENCOUNTER — Encounter (HOSPITAL_COMMUNITY)
Admission: RE | Disposition: A | Discharge status: Home / Self Care | Payer: Self-pay | Source: Home / Self Care | Attending: Surgery

## 2022-01-10 ENCOUNTER — Encounter (HOSPITAL_COMMUNITY): Payer: Self-pay | Admitting: Cardiovascular Disease

## 2022-01-10 DIAGNOSIS — I9581 Postprocedural hypotension: Secondary | ICD-10-CM | POA: Diagnosis present

## 2022-01-10 DIAGNOSIS — Z800 Family history of malignant neoplasm of digestive organs: Secondary | ICD-10-CM

## 2022-01-10 DIAGNOSIS — B1710 Acute hepatitis C without hepatic coma: Secondary | ICD-10-CM | POA: Diagnosis present

## 2022-01-10 DIAGNOSIS — Z9049 Acquired absence of other specified parts of digestive tract: Secondary | ICD-10-CM | POA: Diagnosis not present

## 2022-01-10 DIAGNOSIS — R001 Bradycardia, unspecified: Secondary | ICD-10-CM | POA: Diagnosis present

## 2022-01-10 DIAGNOSIS — I459 Conduction disorder, unspecified: Secondary | ICD-10-CM | POA: Diagnosis present

## 2022-01-10 DIAGNOSIS — F03A0 Unspecified dementia, mild, without behavioral disturbance, psychotic disturbance, mood disturbance, and anxiety: Secondary | ICD-10-CM | POA: Diagnosis present

## 2022-01-10 DIAGNOSIS — Z952 Presence of prosthetic heart valve: Secondary | ICD-10-CM | POA: Diagnosis not present

## 2022-01-10 DIAGNOSIS — E785 Hyperlipidemia, unspecified: Secondary | ICD-10-CM | POA: Diagnosis present

## 2022-01-10 DIAGNOSIS — E039 Hypothyroidism, unspecified: Secondary | ICD-10-CM | POA: Diagnosis present

## 2022-01-10 DIAGNOSIS — J45909 Unspecified asthma, uncomplicated: Secondary | ICD-10-CM | POA: Diagnosis present

## 2022-01-10 DIAGNOSIS — Z87891 Personal history of nicotine dependence: Secondary | ICD-10-CM

## 2022-01-10 DIAGNOSIS — E1151 Type 2 diabetes mellitus with diabetic peripheral angiopathy without gangrene: Secondary | ICD-10-CM | POA: Diagnosis present

## 2022-01-10 DIAGNOSIS — Z6822 Body mass index (BMI) 22.0-22.9, adult: Secondary | ICD-10-CM

## 2022-01-10 DIAGNOSIS — I2510 Atherosclerotic heart disease of native coronary artery without angina pectoris: Secondary | ICD-10-CM | POA: Diagnosis present

## 2022-01-10 DIAGNOSIS — G629 Polyneuropathy, unspecified: Secondary | ICD-10-CM

## 2022-01-10 DIAGNOSIS — Z954 Presence of other heart-valve replacement: Secondary | ICD-10-CM | POA: Diagnosis not present

## 2022-01-10 DIAGNOSIS — K219 Gastro-esophageal reflux disease without esophagitis: Secondary | ICD-10-CM | POA: Diagnosis present

## 2022-01-10 DIAGNOSIS — E1142 Type 2 diabetes mellitus with diabetic polyneuropathy: Secondary | ICD-10-CM | POA: Diagnosis present

## 2022-01-10 DIAGNOSIS — Z79899 Other long term (current) drug therapy: Secondary | ICD-10-CM | POA: Diagnosis not present

## 2022-01-10 DIAGNOSIS — R627 Adult failure to thrive: Secondary | ICD-10-CM | POA: Diagnosis present

## 2022-01-10 DIAGNOSIS — I350 Nonrheumatic aortic (valve) stenosis: Secondary | ICD-10-CM

## 2022-01-10 DIAGNOSIS — Z20822 Contact with and (suspected) exposure to covid-19: Secondary | ICD-10-CM | POA: Diagnosis present

## 2022-01-10 DIAGNOSIS — E119 Type 2 diabetes mellitus without complications: Secondary | ICD-10-CM

## 2022-01-10 DIAGNOSIS — Z888 Allergy status to other drugs, medicaments and biological substances status: Secondary | ICD-10-CM

## 2022-01-10 DIAGNOSIS — Z006 Encounter for examination for normal comparison and control in clinical research program: Secondary | ICD-10-CM

## 2022-01-10 DIAGNOSIS — I10 Essential (primary) hypertension: Secondary | ICD-10-CM | POA: Diagnosis present

## 2022-01-10 DIAGNOSIS — B1920 Unspecified viral hepatitis C without hepatic coma: Secondary | ICD-10-CM | POA: Diagnosis present

## 2022-01-10 DIAGNOSIS — Z8249 Family history of ischemic heart disease and other diseases of the circulatory system: Secondary | ICD-10-CM | POA: Diagnosis not present

## 2022-01-10 DIAGNOSIS — Z01818 Encounter for other preprocedural examination: Secondary | ICD-10-CM

## 2022-01-10 DIAGNOSIS — Z79890 Hormone replacement therapy (postmenopausal): Secondary | ICD-10-CM | POA: Diagnosis not present

## 2022-01-10 HISTORY — PX: INTRAOPERATIVE TRANSTHORACIC ECHOCARDIOGRAM: SHX6523

## 2022-01-10 HISTORY — PX: ULTRASOUND GUIDANCE FOR VASCULAR ACCESS: SHX6516

## 2022-01-10 HISTORY — DX: Presence of prosthetic heart valve: Z95.2

## 2022-01-10 HISTORY — PX: TRANSCATHETER AORTIC VALVE REPLACEMENT, TRANSFEMORAL: SHX6400

## 2022-01-10 HISTORY — DX: Polyneuropathy, unspecified: G62.9

## 2022-01-10 LAB — POCT I-STAT, CHEM 8
BUN: 13 mg/dL (ref 8–23)
BUN: 10 mg/dL (ref 8–23)
Calcium, Ion: 1.23 mmol/L (ref 1.15–1.40)
Calcium, Ion: 1.18 mmol/L (ref 1.15–1.40)
Chloride: 97 mmol/L — ABNORMAL LOW (ref 98–111)
Chloride: 98 mmol/L (ref 98–111)
Creatinine, Ser: 0.7 mg/dL (ref 0.44–1.00)
Creatinine, Ser: 0.6 mg/dL (ref 0.44–1.00)
Glucose, Bld: 104 mg/dL — ABNORMAL HIGH (ref 70–99)
Glucose, Bld: 169 mg/dL — ABNORMAL HIGH (ref 70–99)
HCT: 42 % (ref 36.0–46.0)
HCT: 41 % (ref 36.0–46.0)
Hemoglobin: 14.3 g/dL (ref 12.0–15.0)
Hemoglobin: 13.9 g/dL (ref 12.0–15.0)
Potassium: 3.4 mmol/L — ABNORMAL LOW (ref 3.5–5.1)
Potassium: 3 mmol/L — ABNORMAL LOW (ref 3.5–5.1)
Sodium: 141 mmol/L (ref 135–145)
Sodium: 140 mmol/L (ref 135–145)
TCO2: 37 mmol/L — ABNORMAL HIGH (ref 22–32)
TCO2: 30 mmol/L (ref 22–32)

## 2022-01-10 LAB — URINALYSIS, ROUTINE W REFLEX MICROSCOPIC
Bacteria, UA: NONE SEEN
Bilirubin Urine: NEGATIVE
Glucose, UA: NEGATIVE mg/dL
Hgb urine dipstick: NEGATIVE
Ketones, ur: NEGATIVE mg/dL
Nitrite: NEGATIVE
Protein, ur: 30 mg/dL — AB
Specific Gravity, Urine: 1.018 (ref 1.005–1.030)
pH: 5 (ref 5.0–8.0)

## 2022-01-10 LAB — GLUCOSE, CAPILLARY
Glucose-Capillary: 99 mg/dL (ref 70–99)
Glucose-Capillary: 142 mg/dL — ABNORMAL HIGH (ref 70–99)

## 2022-01-10 LAB — ECHOCARDIOGRAM LIMITED
AR max vel: 1.95 cm2
AV Area VTI: 1.56 cm2
AV Area mean vel: 2.08 cm2
AV Mean grad: 3 mmHg
AV Peak grad: 4.4 mmHg
Ao pk vel: 1.05 m/s

## 2022-01-10 LAB — ABO/RH: ABO/RH(D): A POS

## 2022-01-10 SURGERY — IMPLANTATION, AORTIC VALVE, TRANSCATHETER, FEMORAL APPROACH
Anesthesia: Monitor Anesthesia Care | Site: Groin

## 2022-01-10 MED ORDER — CHLORHEXIDINE GLUCONATE 0.12 % MT SOLN: 15.0000 mL | Freq: Once | OROMUCOSAL | Status: AC

## 2022-01-10 MED ORDER — SODIUM CHLORIDE 0.9 % IV SOLN: INTRAVENOUS | Status: AC

## 2022-01-10 MED ORDER — LACTATED RINGERS IV SOLN: INTRAVENOUS | Status: DC | PRN

## 2022-01-10 MED ORDER — VASOPRESSIN 20 UNIT/ML IV SOLN
INTRAVENOUS | Status: AC
  Filled 2022-01-10: qty 1

## 2022-01-10 MED ORDER — CEFAZOLIN SODIUM-DEXTROSE 2-4 GM/100ML-% IV SOLN
2.0000 g | Freq: Three times a day (TID) | INTRAVENOUS | Status: AC
  Administered 2022-01-11 (×2): 2 g via INTRAVENOUS
  Filled 2022-01-10 (×2): qty 100

## 2022-01-10 MED ORDER — MUPIROCIN 2 % EX OINT
1.0000 "application " | TOPICAL_OINTMENT | Freq: Two times a day (BID) | CUTANEOUS | Status: DC
  Administered 2022-01-10 – 2022-01-12 (×4): 1 via NASAL
  Filled 2022-01-10: qty 22

## 2022-01-10 MED ORDER — POTASSIUM CHLORIDE 10 MEQ/100ML IV SOLN
10.0000 meq | INTRAVENOUS | Status: AC
  Administered 2022-01-10 (×6): 10 meq via INTRAVENOUS
  Filled 2022-01-10 (×6): qty 100

## 2022-01-10 MED ORDER — ONDANSETRON HCL 4 MG/2ML IJ SOLN
INTRAMUSCULAR | Status: DC | PRN
  Administered 2022-01-10: 4 mg via INTRAVENOUS

## 2022-01-10 MED ORDER — MORPHINE SULFATE (PF) 2 MG/ML IV SOLN: 1.0000 mg | INTRAVENOUS | Status: DC | PRN

## 2022-01-10 MED ORDER — ACETAMINOPHEN 500 MG PO TABS: 1000.0000 mg | ORAL_TABLET | Freq: Once | ORAL | Status: AC

## 2022-01-10 MED ORDER — ROSUVASTATIN CALCIUM 20 MG PO TABS
40.0000 mg | ORAL_TABLET | Freq: Every day | ORAL | Status: DC
Start: 2022-01-10 — End: 2022-01-12
  Administered 2022-01-10 – 2022-01-12 (×3): 40 mg via ORAL
  Filled 2022-01-10 (×3): qty 2

## 2022-01-10 MED ORDER — PROPOFOL 10 MG/ML IV BOLUS
INTRAVENOUS | Status: DC | PRN
  Administered 2022-01-10: 20 mg via INTRAVENOUS

## 2022-01-10 MED ORDER — CHLORHEXIDINE GLUCONATE 4 % EX LIQD: 60.0000 mL | Freq: Once | CUTANEOUS | Status: DC

## 2022-01-10 MED ORDER — SODIUM CHLORIDE (PF) 0.9 % IJ SOLN
INTRAMUSCULAR | Status: AC
  Filled 2022-01-10: qty 10

## 2022-01-10 MED ORDER — ALBUTEROL SULFATE (2.5 MG/3ML) 0.083% IN NEBU: 2.5000 mg | INHALATION_SOLUTION | Freq: Four times a day (QID) | RESPIRATORY_TRACT | Status: DC | PRN

## 2022-01-10 MED ORDER — TRAMADOL HCL 50 MG PO TABS: 50.0000 mg | ORAL_TABLET | ORAL | Status: DC | PRN

## 2022-01-10 MED ORDER — SODIUM CHLORIDE 0.9 % IV SOLN: 250.0000 mL | INTRAVENOUS | Status: DC | PRN

## 2022-01-10 MED ORDER — VASOPRESSIN 20 UNIT/ML IV SOLN
INTRAVENOUS | Status: DC | PRN
  Administered 2022-01-10: .5 [IU] via INTRAVENOUS
  Administered 2022-01-10: 1 [IU] via INTRAVENOUS

## 2022-01-10 MED ORDER — LIDOCAINE HCL 1 % IJ SOLN
INTRAMUSCULAR | Status: DC | PRN
  Administered 2022-01-10: 6 mL via INTRADERMAL

## 2022-01-10 MED ORDER — EPHEDRINE SULFATE-NACL 50-0.9 MG/10ML-% IV SOSY
PREFILLED_SYRINGE | INTRAVENOUS | Status: DC | PRN
  Administered 2022-01-10: 2.5 mg via INTRAVENOUS

## 2022-01-10 MED ORDER — LIDOCAINE HCL 1 % IJ SOLN
INTRAMUSCULAR | Status: AC
  Filled 2022-01-10: qty 20

## 2022-01-10 MED ORDER — HEPARIN SODIUM (PORCINE) 1000 UNIT/ML IJ SOLN
INTRAMUSCULAR | Status: AC
  Filled 2022-01-10: qty 10

## 2022-01-10 MED ORDER — FENTANYL CITRATE (PF) 250 MCG/5ML IJ SOLN
INTRAMUSCULAR | Status: AC
  Filled 2022-01-10: qty 5

## 2022-01-10 MED ORDER — LEVOTHYROXINE SODIUM 100 MCG PO TABS
100.0000 ug | ORAL_TABLET | Freq: Every day | ORAL | Status: DC
  Administered 2022-01-11 – 2022-01-12 (×2): 100 ug via ORAL
  Filled 2022-01-10 (×2): qty 1

## 2022-01-10 MED ORDER — CHLORHEXIDINE GLUCONATE CLOTH 2 % EX PADS
6.0000 | MEDICATED_PAD | Freq: Every day | CUTANEOUS | Status: DC
  Administered 2022-01-12: 6 via TOPICAL

## 2022-01-10 MED ORDER — SODIUM CHLORIDE 0.9 % IV SOLN: INTRAVENOUS | Status: DC

## 2022-01-10 MED ORDER — HEPARIN 6000 UNIT IRRIGATION SOLUTION
Status: AC
  Filled 2022-01-10: qty 1500

## 2022-01-10 MED ORDER — DONEPEZIL HCL 5 MG PO TABS
10.0000 mg | ORAL_TABLET | Freq: Every day | ORAL | Status: DC
  Administered 2022-01-10 – 2022-01-11 (×2): 10 mg via ORAL
  Filled 2022-01-10: qty 2
  Filled 2022-01-10 (×2): qty 1

## 2022-01-10 MED ORDER — FAMOTIDINE 20 MG PO TABS
10.0000 mg | ORAL_TABLET | Freq: Two times a day (BID) | ORAL | Status: DC
Start: 2022-01-10 — End: 2022-01-12
  Administered 2022-01-10 – 2022-01-12 (×4): 10 mg via ORAL
  Filled 2022-01-10 (×4): qty 1

## 2022-01-10 MED ORDER — PROPOFOL 500 MG/50ML IV EMUL
INTRAVENOUS | Status: DC | PRN
Start: 2022-01-10 — End: 2022-01-10
  Administered 2022-01-10: 20 ug/kg/min via INTRAVENOUS

## 2022-01-10 MED ORDER — SODIUM CHLORIDE 0.9% FLUSH
3.0000 mL | Freq: Two times a day (BID) | INTRAVENOUS | Status: DC
  Administered 2022-01-10 – 2022-01-12 (×4): 3 mL via INTRAVENOUS

## 2022-01-10 MED ORDER — ACETAMINOPHEN 650 MG RE SUPP: 650.0000 mg | Freq: Four times a day (QID) | RECTAL | Status: DC | PRN

## 2022-01-10 MED ORDER — PROTAMINE SULFATE 10 MG/ML IV SOLN
INTRAVENOUS | Status: AC
  Filled 2022-01-10: qty 10

## 2022-01-10 MED ORDER — HEPARIN 6000 UNIT IRRIGATION SOLUTION
Status: DC | PRN
  Administered 2022-01-10: 3

## 2022-01-10 MED ORDER — PROTAMINE SULFATE 10 MG/ML IV SOLN
INTRAVENOUS | Status: DC | PRN
  Administered 2022-01-10: 80 mg via INTRAVENOUS

## 2022-01-10 MED ORDER — PROPOFOL 1000 MG/100ML IV EMUL
INTRAVENOUS | Status: AC
  Filled 2022-01-10: qty 100

## 2022-01-10 MED ORDER — CHLORHEXIDINE GLUCONATE 0.12 % MT SOLN
OROMUCOSAL | Status: AC
  Administered 2022-01-10: 10:00:00 15 mL via OROMUCOSAL
  Filled 2022-01-10: qty 15

## 2022-01-10 MED ORDER — ASPIRIN 81 MG PO CHEW
81.0000 mg | CHEWABLE_TABLET | Freq: Every day | ORAL | Status: DC
  Administered 2022-01-11 – 2022-01-12 (×2): 81 mg via ORAL
  Filled 2022-01-10 (×2): qty 1

## 2022-01-10 MED ORDER — ACETAMINOPHEN 325 MG PO TABS: 650.0000 mg | ORAL_TABLET | Freq: Four times a day (QID) | ORAL | Status: DC | PRN

## 2022-01-10 MED ORDER — HEPARIN SODIUM (PORCINE) 1000 UNIT/ML IJ SOLN
INTRAMUSCULAR | Status: DC | PRN
  Administered 2022-01-10: 8000 [IU] via INTRAVENOUS

## 2022-01-10 MED ORDER — NITROGLYCERIN IN D5W 200-5 MCG/ML-% IV SOLN: 0.0000 ug/min | INTRAVENOUS | Status: DC

## 2022-01-10 MED ORDER — INSULIN ASPART 100 UNIT/ML IJ SOLN
0.0000 [IU] | Freq: Three times a day (TID) | INTRAMUSCULAR | Status: DC
  Administered 2022-01-10 – 2022-01-11 (×2): 2 [IU] via SUBCUTANEOUS

## 2022-01-10 MED ORDER — IODIXANOL 320 MG/ML IV SOLN
INTRAVENOUS | Status: DC | PRN
  Administered 2022-01-10: 13:00:00 90 mL via INTRA_ARTERIAL

## 2022-01-10 MED ORDER — SODIUM CHLORIDE 0.9 % IV SOLN: INTRAVENOUS | Status: DC | PRN

## 2022-01-10 MED ORDER — OXYCODONE HCL 5 MG PO TABS: 5.0000 mg | ORAL_TABLET | ORAL | Status: DC | PRN

## 2022-01-10 MED ORDER — ONDANSETRON HCL 4 MG/2ML IJ SOLN: 4.0000 mg | Freq: Four times a day (QID) | INTRAMUSCULAR | Status: DC | PRN

## 2022-01-10 MED ORDER — CHLORHEXIDINE GLUCONATE 4 % EX LIQD: 30.0000 mL | CUTANEOUS | Status: DC

## 2022-01-10 MED ORDER — ACETAMINOPHEN 500 MG PO TABS
ORAL_TABLET | ORAL | Status: AC
  Administered 2022-01-10: 10:00:00 1000 mg via ORAL
  Filled 2022-01-10: qty 2

## 2022-01-10 MED ORDER — SODIUM CHLORIDE 0.9% FLUSH: 3.0000 mL | INTRAVENOUS | Status: DC | PRN

## 2022-01-10 SURGICAL SUPPLY — 65 items
ADH SKN CLS APL DERMABOND .7 (GAUZE/BANDAGES/DRESSINGS) ×2
ADH SKN CLS LQ APL DERMABOND (GAUZE/BANDAGES/DRESSINGS) ×4
APL PRP STRL LF DISP 70% ISPRP (MISCELLANEOUS) ×2
BAG COUNTER SPONGE SURGICOUNT (BAG) ×4 IMPLANT
BAG DECANTER FOR FLEXI CONT (MISCELLANEOUS) IMPLANT
BAG SPNG CNTER NS LX DISP (BAG) ×2
BLADE CLIPPER SURG (BLADE) IMPLANT
BLADE STERNUM SYSTEM 6 (BLADE) IMPLANT
CABLE ADAPT CONN TEMP 6FT (ADAPTER) ×4 IMPLANT
CANISTER SUCT 3000ML PPV (MISCELLANEOUS) IMPLANT
CATH DIAG EXPO 6F AL1 (CATHETERS) IMPLANT
CATH DIAG EXPO 6F VENT PIG 145 (CATHETERS) ×8 IMPLANT
CATH INFINITI 6F AL2 (CATHETERS) IMPLANT
CATH S G BIP PACING (CATHETERS) ×4 IMPLANT
CHLORAPREP W/TINT 26 (MISCELLANEOUS) ×4 IMPLANT
CLOSURE MYNX CONTROL 6F/7F (Vascular Products) ×1 IMPLANT
CNTNR URN SCR LID CUP LEK RST (MISCELLANEOUS) ×6 IMPLANT
CONT SPEC 4OZ STRL OR WHT (MISCELLANEOUS) ×6
COVER BACK TABLE 80X110 HD (DRAPES) IMPLANT
DECANTER SPIKE VIAL GLASS SM (MISCELLANEOUS) ×4 IMPLANT
DERMABOND ADHESIVE PROPEN (GAUZE/BANDAGES/DRESSINGS) ×2
DERMABOND ADVANCED (GAUZE/BANDAGES/DRESSINGS) ×1
DERMABOND ADVANCED .7 DNX12 (GAUZE/BANDAGES/DRESSINGS) ×3 IMPLANT
DERMABOND ADVANCED .7 DNX6 (GAUZE/BANDAGES/DRESSINGS) IMPLANT
DEVICE CLOSURE PERCLS PRGLD 6F (VASCULAR PRODUCTS) ×6 IMPLANT
DRSG TEGADERM 4X4.75 (GAUZE/BANDAGES/DRESSINGS) ×8 IMPLANT
ELECT REM PT RETURN 9FT ADLT (ELECTROSURGICAL) ×3
ELECTRODE REM PT RTRN 9FT ADLT (ELECTROSURGICAL) ×3 IMPLANT
GAUZE SPONGE 4X4 12PLY STRL (GAUZE/BANDAGES/DRESSINGS) ×4 IMPLANT
GLOVE SURG ENC MOIS LTX SZ7.5 (GLOVE) IMPLANT
GLOVE SURG ENC MOIS LTX SZ8 (GLOVE) IMPLANT
GLOVE SURG ORTHO LTX SZ7.5 (GLOVE) IMPLANT
GOWN STRL REUS W/ TWL LRG LVL3 (GOWN DISPOSABLE) IMPLANT
GOWN STRL REUS W/ TWL XL LVL3 (GOWN DISPOSABLE) ×3 IMPLANT
GOWN STRL REUS W/TWL LRG LVL3 (GOWN DISPOSABLE)
GOWN STRL REUS W/TWL XL LVL3 (GOWN DISPOSABLE) ×3
GUIDEWIRE SAF TJ AMPL .035X180 (WIRE) ×4 IMPLANT
GUIDEWIRE SAFE TJ AMPLATZ EXST (WIRE) ×4 IMPLANT
KIT BASIN OR (CUSTOM PROCEDURE TRAY) ×4 IMPLANT
KIT HEART LEFT (KITS) ×4 IMPLANT
KIT SAPIAN 3 ULTRA RESILIA 23 (Valve) ×1 IMPLANT
KIT TURNOVER KIT B (KITS) ×4 IMPLANT
NS IRRIG 1000ML POUR BTL (IV SOLUTION) ×4 IMPLANT
PACK ENDO MINOR (CUSTOM PROCEDURE TRAY) ×4 IMPLANT
PAD ARMBOARD 7.5X6 YLW CONV (MISCELLANEOUS) ×8 IMPLANT
PAD ELECT DEFIB RADIOL ZOLL (MISCELLANEOUS) ×4 IMPLANT
PERCLOSE PROGLIDE 6F (VASCULAR PRODUCTS) ×6
POSITIONER HEAD DONUT 9IN (MISCELLANEOUS) ×4 IMPLANT
SET MICROPUNCTURE 5F STIFF (MISCELLANEOUS) ×4 IMPLANT
SHEATH BRITE TIP 7FR 35CM (SHEATH) ×4 IMPLANT
SHEATH PINNACLE 6F 10CM (SHEATH) ×4 IMPLANT
SHEATH PINNACLE 8F 10CM (SHEATH) ×4 IMPLANT
SLEEVE REPOSITIONING LENGTH 30 (MISCELLANEOUS) ×4 IMPLANT
STOPCOCK MORSE 400PSI 3WAY (MISCELLANEOUS) ×8 IMPLANT
SUT PROLENE 6 0 C 1 30 (SUTURE) IMPLANT
SUT SILK  1 MH (SUTURE) ×6
SUT SILK 1 MH (SUTURE) ×3 IMPLANT
SYR 50ML LL SCALE MARK (SYRINGE) ×4 IMPLANT
SYR BULB IRRIG 60ML STRL (SYRINGE) IMPLANT
TOWEL GREEN STERILE (TOWEL DISPOSABLE) ×8 IMPLANT
TRANSDUCER W/STOPCOCK (MISCELLANEOUS) ×8 IMPLANT
TRAY FOLEY SLVR 14FR TEMP STAT (SET/KITS/TRAYS/PACK) IMPLANT
TUBE SUCT INTRACARD DLP 20F (MISCELLANEOUS) IMPLANT
WIRE EMERALD 3MM-J .035X150CM (WIRE) ×4 IMPLANT
WIRE EMERALD 3MM-J .035X260CM (WIRE) ×4 IMPLANT

## 2022-01-10 NOTE — Anesthesia Preprocedure Evaluation (Addendum)
Anesthesia Evaluation  Patient identified by MRN, date of birth, ID band Patient awake    Reviewed: Allergy & Precautions, NPO status , Patient's Chart, lab work & pertinent test results  History of Anesthesia Complications Negative for: history of anesthetic complications  Airway Mallampati: III  TM Distance: >3 FB Neck ROM: Full    Dental  (+) Dental Advisory Given, Missing   Pulmonary asthma , former smoker,    Pulmonary exam normal        Cardiovascular hypertension, Pt. on medications + Peripheral Vascular Disease  + Valvular Problems/Murmurs AS  Rhythm:Regular Rate:Normal + Systolic murmurs  '22 Cath - Mid RCA lesion is 20% stenosed.   1st Mrg lesion is 20% stenosed.   Mid Cx lesion is 30% stenosed.  '22 TTE - Given valve morphology, suspect severe  paradoxical aortic stenosis with AVA 0.55, mean gradient 70mHg, peak gradient 493mg, Vmax 3.94m32m DI 0.17. SVI low at 31. Gradients likely underestimated due to off-axis doppler interrogation. EF 60 to 65%. Mild asymmetric left ventricular hypertrophy of the basal-septal segment. Grade I diastolic dysfunction (impaired relaxation). Trivial mitral valve regurgitation.    Neuro/Psych PSYCHIATRIC DISORDERS Depression Dementia  Neuromuscular disease CVA, No Residual Symptoms    GI/Hepatic GERD  Controlled,(+) Hepatitis -, C  Endo/Other  diabetes, Well Controlled, Type 2Hypothyroidism   Renal/GU negative Renal ROS     Musculoskeletal  (+) Arthritis ,   Abdominal   Peds  Hematology negative hematology ROS (+)   Anesthesia Other Findings   Reproductive/Obstetrics                            Anesthesia Physical Anesthesia Plan  ASA: 4  Anesthesia Plan: MAC   Post-op Pain Management: Tylenol PO (pre-op)   Induction:   PONV Risk Score and Plan: 2 and Propofol infusion and Treatment may vary due to age or medical condition  Airway  Management Planned: Natural Airway and Simple Face Mask  Additional Equipment: None  Intra-op Plan:   Post-operative Plan:   Informed Consent: I have reviewed the patients History and Physical, chart, labs and discussed the procedure including the risks, benefits and alternatives for the proposed anesthesia with the patient or authorized representative who has indicated his/her understanding and acceptance.     Consent reviewed with POA  Plan Discussed with: CRNA and Anesthesiologist  Anesthesia Plan Comments:        Anesthesia Quick Evaluation

## 2022-01-10 NOTE — Interval H&P Note (Signed)
History and Physical Interval Note:  01/10/2022 10:10 AM  Mandy Webb  has presented today for surgery, with the diagnosis of Severe Aortic Stenosis.  The various methods of treatment have been discussed with the patient and family. After consideration of risks, benefits and other options for treatment, the patient has consented to  Procedure(s): TRANSCATHETER AORTIC VALVE REPLACEMENT, TRANSFEMORAL (N/A) INTRAOPERATIVE TRANSTHORACIC ECHOCARDIOGRAM (N/A) as a surgical intervention.  The patient's history has been reviewed, patient examined, no change in status, stable for surgery.  I have reviewed the patient's chart and labs.  Questions were answered to the patient's satisfaction.     [name]

## 2022-01-10 NOTE — Op Note (Signed)
[facility]   MULTIDISCIPLINARY HEART VALVE TEAM   TAVR OPERATIVE NOTE   Date of Procedure:  01/10/2022  Preoperative Diagnosis: Severe Aortic Stenosis   Postoperative Diagnosis: Same   Procedure:   Transcatheter Aortic Valve Replacement - Percutaneous Right Transfemoral Approach  Edwards Sapien 3 Ultra THV (size 23 mm, model # 9755RSL, serial # [id number])   Co-Surgeons:  [name], MD and Sherren Mocha, MD     Anesthesiologist:  Raechel Ache, MD  Echocardiographer:  Osborne Oman, MD  Pre-operative Echo Findings: Severe aortic stenosis Normal left ventricular systolic function  Post-operative Echo Findings: No paravalvular leak Normal left ventricular systolic function   BRIEF CLINICAL NOTE AND INDICATIONS FOR SURGERY  This 76 year old woman has stage D, severe, symptomatic aortic stenosis with New York Heart Association class 2-3 symptoms of exertional fatigue and shortness of breath consistent with chronic diastolic congestive heart failure.  According to her family today she is not doing much at home except laying in bed and requires a lot of encouragement to eat and get up to go to the bathroom.  It sounds like this has been going on for some time but recently worsening.  She has some baseline dementia and it  is not clear to me how much of her failure to thrive is due to progression of her dementia and how much is due to severe aortic stenosis.  Her daughter said that she got up out of bed herself today and got dressed with assistance for her appointment with me.  I have personally reviewed her 2D echocardiogram, cardiac catheterization, and CTA studies.  Her echo shows a severely calcified and thickened aortic valve with poor mobility of the leaflets.  Mean gradient is only 27 mmHg but aortic valve area was measured at 0.6 cm with a dimensionless index of 0.17.  It was felt the Doppler may have been off axis accounting for underestimation of her  gradient.  Her valve certainly looks severely stenotic.  Stroke-volume index is low.  Cardiac catheterization shows mild nonobstructive coronary disease.  I do not think she is a candidate for open surgical aortic valve replacement at her age with dementia and her level of functioning at the present time.  I think it is reasonable to offer her TAVR as an alternative for treatment of her aortic stenosis to see if that improves her level of functioning and mental status.  Her gated cardiac CTA shows anatomy suitable for TAVR using a SAPIEN 3 valve.  Her abdominal and pelvic CTA shows adequate pelvic vascular anatomy to allow transfemoral insertion.   The patient and her daughter and daughter-in-law were counseled at length regarding treatment alternatives for management of severe symptomatic aortic stenosis. The risks and benefits of surgical intervention has been discussed in detail. Long-term prognosis with medical therapy was discussed. Alternative approaches such as conventional surgical aortic valve replacement, transcatheter aortic valve replacement, and palliative medical therapy were compared and contrasted at length. This discussion was placed in the context of the patient's own specific clinical presentation and past medical history. All of their questions have been addressed.    Following the decision to proceed with transcatheter aortic valve replacement, a discussion was held regarding what types of management strategies would be attempted intraoperatively in the event of life-threatening complications, including whether or not the patient would be considered a candidate for the use of cardiopulmonary bypass and/or conversion to open sternotomy for attempted surgical intervention.  I do not think she is a  candidate for emergent sternotomy to manage any intraoperative complications given her advanced age with dementia and her present level of functioning.  The patient is aware of the fact that  transient use of cardiopulmonary bypass may be necessary. The patient has been advised of a variety of complications that might develop including but not limited to risks of death, stroke, paravalvular leak, aortic dissection or other major vascular complications, aortic annulus rupture, device embolization, cardiac rupture or perforation, mitral regurgitation, acute myocardial infarction, arrhythmia, heart block or bradycardia requiring permanent pacemaker placement, congestive heart failure, respiratory failure, renal failure, pneumonia, infection, other late complications related to structural valve deterioration or migration, or other complications that might ultimately cause a temporary or permanent loss of functional independence or other long term morbidity.  I also stressed the possibility that this may not improve her mental status and activity level despite correcting her severe aortic stenosis.  The patient and her daughter provide full informed consent for the procedure as described and all questions were answered.     DETAILS OF THE OPERATIVE PROCEDURE  PREPARATION:    The patient was brought to the operating room on the above mentioned date and appropriate monitoring was established by the anesthesia team. The patient was placed in the supine position on the operating table.  Intravenous antibiotics were administered. The patient was monitored closely throughout the procedure under conscious sedation.    Baseline transthoracic echocardiogram was performed. The patient's abdomen and both groins were prepped and draped in a sterile manner. A time out procedure was performed.   PERIPHERAL ACCESS:    Using the modified Seldinger technique, femoral arterial and venous access was obtained with placement of 6 Fr sheaths on the left side.  A pigtail diagnostic catheter was passed through the left arterial sheath under fluoroscopic guidance into the aortic root.  A temporary transvenous pacemaker  catheter was passed through the left femoral venous sheath under fluoroscopic guidance into the right ventricle.  The pacemaker was tested to ensure stable lead placement and pacemaker capture. Aortic root angiography was performed in order to determine the optimal angiographic angle for valve deployment.   TRANSFEMORAL ACCESS:   Percutaneous transfemoral access and sheath placement was performed using ultrasound guidance.  The right common femoral artery was cannulated using a micropuncture needle and appropriate location was verified using hand injection angiogram.  A pair of Abbott Perclose percutaneous closure devices were placed and a 6 French sheath replaced into the femoral artery.  The patient was heparinized systemically and ACT verified > 250 seconds.    A 14 Fr transfemoral E-sheath was introduced into the right common femoral artery after progressively dilating over an Amplatz superstiff wire. An AL-1 catheter was used to direct a straight-tip exchange length wire across the native aortic valve into the left ventricle. This was exchanged out for a pigtail catheter and position was confirmed in the LV apex. Simultaneous LV and Ao pressures were recorded.  The pigtail catheter was exchanged for an Amplatz Extra-stiff wire in the LV apex.  BALLOON AORTIC VALVULOPLASTY:   Not performed.    TRANSCATHETER HEART VALVE DEPLOYMENT:   An Edwards Sapien 3 Ultra transcatheter heart valve (size 23 mm) was prepared and crimped per manufacturer's guidelines, and the proper orientation of the valve is confirmed on the [employer] delivery system. The valve was advanced through the introducer sheath using normal technique until in an appropriate position in the abdominal aorta beyond the sheath tip. The balloon was then retracted and  using the fine-tuning wheel was centered on the valve. The valve was then advanced across the aortic arch using appropriate flexion of the catheter. The valve was  carefully positioned across the aortic valve annulus. The Commander catheter was retracted using normal technique. Once final position of the valve has been confirmed by angiographic assessment, the valve is deployed while temporarily holding ventilation and during rapid ventricular pacing to maintain systolic blood pressure < 50 mmHg and pulse pressure < 10 mmHg. The balloon inflation is held for >3 seconds after reaching full deployment volume. Once the balloon has fully deflated the balloon is retracted into the ascending aorta and valve function is assessed using echocardiography. There is felt to be no paravalvular leak and no central aortic insufficiency.  The patient's hemodynamic recovery following valve deployment is good. The patient developed some bradycardia and heart block and was paced using the temporary pacing catheter. The deployment balloon and guidewire are both removed.    PROCEDURE COMPLETION:   The sheath was removed and femoral artery closure performed.  Protamine was administered once femoral arterial repair was complete.The pacer rate was decreased and the patient's own rhythm took over. She appeared to be in a junctional rhythm. We decided to leave the temporary pacing wire in.  The pigtail catheter and femoral sheath were removed with a Mynx femoral closure device was utilized following removal of the diagnostic sheath in the left femoral artery. The pacing catheter was left in place in the left femoral sheath.   The patient tolerated the procedure well and is transported to the cath lab recovery area in stable condition. There were no immediate intraoperative complications. All sponge instrument and needle counts are verified correct at completion of the operation.   No blood products were administered during the operation.  The patient received a total of 60 mL of intravenous contrast during the procedure.   [name], MD 01/10/2022

## 2022-01-10 NOTE — Anesthesia Procedure Notes (Addendum)
Procedure Name: MAC Date/Time: 01/10/2022 11:35 AM Performed by: Janene Harvey, CRNA Pre-anesthesia Checklist: Patient identified, Emergency Drugs available, Suction available, Patient being monitored and Timeout performed Patient Re-evaluated:Patient Re-evaluated prior to induction Oxygen Delivery Method: Simple face mask Induction Type: IV induction Placement Confirmation: positive ETCO2 Dental Injury: Teeth and Oropharynx as per pre-operative assessment

## 2022-01-10 NOTE — Op Note (Signed)
[facility]   MULTIDISCIPLINARY HEART VALVE TEAM   TAVR OPERATIVE NOTE   Date of Procedure:  01/10/2022  Preoperative Diagnosis: Severe Aortic Stenosis   Postoperative Diagnosis: Same   Procedure:   Transcatheter Aortic Valve Replacement - Percutaneous Transfemoral Approach  Edwards Sapien 3 Resilia THV (size 23 mm, serial # [id number])   Co-Surgeons:  [name], MD and Sherren Mocha, MD  Anesthesiologist:  Renold Don, MD  Echocardiographer:  Rudean Haskell, MD  Pre-operative Echo Findings: Severe aortic stenosis Normal left ventricular systolic function  Post-operative Echo Findings: No paravalvular leak Normal left ventricular systolic function  BRIEF CLINICAL NOTE AND INDICATIONS FOR SURGERY  76 year old woman with diabetes, hypertension, hyperlipidemia, and dementia, who has developed severe symptomatic aortic stenosis.  She lives at home with care provided by her family.  She has had progressive shortness of breath and fatigue over the past 6 to 9 months.  She also admits to lightheadedness and near syncope at times.  She was found to have severe paradoxical low-flow low gradient aortic stenosis with a mean gradient of 27 mmHg, dimensionless index 0.17, stroke-volume index 31, and calculated aortic valve area 0.55 cm.  She underwent multimodality imaging, demonstrating suitable anatomy for transfemoral access and TAVR with a 23 mm SAPIEN 3 valve.  She was found to have no significant coronary artery disease at catheterization.  During the course of the patient's preoperative work up they have been evaluated comprehensively by a multidisciplinary team of specialists coordinated through the [facility] in the [facility].  They have been demonstrated to suffer from symptomatic severe aortic stenosis as noted above. The patient has been counseled extensively as to the relative risks and benefits of  all options for the treatment of severe aortic stenosis including long term medical therapy, conventional surgery for aortic valve replacement, and transcatheter aortic valve replacement.  The patient has been independently evaluated in formal cardiac surgical consultation by Dr Cyndia Bent, who deemed the patient appropriate for TAVR. Based upon review of all of the patient's preoperative diagnostic tests they are felt to be candidate for transcatheter aortic valve replacement using the transfemoral approach as an alternative to conventional surgery.    Following the decision to proceed with transcatheter aortic valve replacement, a discussion has been held regarding what types of management strategies would be attempted intraoperatively in the event of life-threatening complications, including whether or not the patient would be considered a candidate for the use of cardiopulmonary bypass and/or conversion to open sternotomy for attempted surgical intervention.  The patient has been advised of a variety of complications that might develop peculiar to this approach including but not limited to risks of death, stroke, paravalvular leak, aortic dissection or other major vascular complications, aortic annulus rupture, device embolization, cardiac rupture or perforation, acute myocardial infarction, arrhythmia, heart block or bradycardia requiring permanent pacemaker placement, congestive heart failure, respiratory failure, renal failure, pneumonia, infection, other late complications related to structural valve deterioration or migration, or other complications that might ultimately cause a temporary or permanent loss of functional independence or other long term morbidity.  The patient provides full informed consent for the procedure as described and all questions were answered preoperatively.  DETAILS OF THE OPERATIVE PROCEDURE  PREPARATION:   The patient is brought to the operating room on the above mentioned  date and central monitoring was established by the anesthesia team including placement of a radial arterial line. The patient is placed in the  supine position on the operating table.  Intravenous antibiotics are administered. The patient is monitored closely throughout the procedure under conscious sedation.  Baseline transthoracic echocardiogram is performed. The patient's chest, abdomen, both groins, and both lower extremities are prepared and draped in a sterile manner. A time out procedure is performed.   PERIPHERAL ACCESS:   Using ultrasound guidance, femoral arterial and venous access is obtained with placement of 6 Fr sheaths on the left side.  [place] images are digitally captured and stored in the patient's chart. A pigtail diagnostic catheter was passed through the femoral arterial sheath under fluoroscopic guidance into the aortic root.  A temporary transvenous pacemaker catheter was passed through the femoral venous sheath under fluoroscopic guidance into the right ventricle.  The pacemaker was tested to ensure stable lead placement and pacemaker capture. Aortic root angiography was performed in order to determine the optimal angiographic angle for valve deployment.  TRANSFEMORAL ACCESS:  A micropuncture technique is used to access the right femoral artery under fluoroscopic and ultrasound guidance.  2 Perclose devices are deployed at 10' and 2' positions to 'PreClose' the femoral artery. An 8 French sheath is placed and then an Amplatz Superstiff wire is advanced through the sheath. This is changed out for a 14 French transfemoral E-Sheath after progressively dilating over the Superstiff wire.  An AL-1 catheter was used to direct a straight-tip exchange length wire across the native aortic valve into the left ventricle. This was exchanged out for a pigtail catheter and position was confirmed in the LV apex. Simultaneous LV and Ao pressures were recorded.  The pigtail catheter was exchanged for an  Amplatz Extra-stiff wire in the LV apex.    BALLOON AORTIC VALVULOPLASTY:  Not performed  TRANSCATHETER HEART VALVE DEPLOYMENT:  An Edwards Sapien 3 transcatheter heart valve (size 23 mm) was prepared and crimped per manufacturer's guidelines, and the proper orientation of the valve is confirmed on the [employer] delivery system. The valve was advanced through the introducer sheath using normal technique until in an appropriate position in the abdominal aorta beyond the sheath tip. The balloon was then retracted and using the fine-tuning wheel was centered on the valve. The valve was then advanced across the aortic arch using appropriate flexion of the catheter. The valve was carefully positioned across the aortic valve annulus. The Commander catheter was retracted using normal technique. Once final position of the valve has been confirmed by angiographic assessment, the valve is deployed while temporarily holding ventilation and during rapid ventricular pacing to maintain systolic blood pressure < 50 mmHg and pulse pressure < 10 mmHg. The balloon inflation is held for >3 seconds after reaching full deployment volume. Once the balloon has fully deflated the balloon is retracted into the ascending aorta and valve function is assessed using echocardiography. The patient's hemodynamic recovery following valve deployment is good.  The deployment balloon and guidewire are both removed. Echo demostrated acceptable post-procedural gradients, stable mitral valve function, and no aortic insufficiency.    PROCEDURE COMPLETION:  The sheath was removed and femoral artery closure is performed using the 2 previously deployed Perclose devices.  Protamine is administered once femoral arterial repair was complete. The site is clear with no evidence of bleeding or hematoma after the sutures are tightened. The temporary pacemaker and pigtail catheters are removed. Mynx closure is used for contralateral femoral  arterial hemostasis for the 6 Fr sheath.  The patient tolerated the procedure well and is transported to the recovery area in stable condition.  There were no immediate intraoperative complications. All sponge instrument and needle counts are verified correct at completion of the operation.   The patient received a total of 60 mL of intravenous contrast during the procedure.   Sherren Mocha, MD 01/10/2022 2:18 PM

## 2022-01-10 NOTE — Anesthesia Postprocedure Evaluation (Signed)
Anesthesia Post Note  Patient: Mandy Webb  Procedure(s) Performed: TRANSCATHETER AORTIC VALVE REPLACEMENT, TRANSFEMORAL (Chest) INTRAOPERATIVE TRANSTHORACIC ECHOCARDIOGRAM (Chest) ULTRASOUND GUIDANCE FOR VASCULAR ACCESS (Bilateral: Groin)     Patient location during evaluation: PACU Anesthesia Type: MAC Level of consciousness: awake (A little drowsy, but near preoperative baseline) Pain management: pain level controlled Vital Signs Assessment: post-procedure vital signs reviewed and stable Respiratory status: spontaneous breathing, nonlabored ventilation and respiratory function stable Cardiovascular status: stable and bradycardic (Pacing at 60bpm, low dose levophed for MAP support) Anesthetic complications: no   No notable events documented.  Last Vitals:  Vitals:   01/10/22 1427 01/10/22 1503  BP:  (!) 106/46  Pulse: (!) 0 62  Resp:  12  Temp:  (!) 36.2 C  SpO2:      Last Pain:  Vitals:   01/10/22 1503  TempSrc: Temporal  PainSc: 0-No pain                 Audry Pili

## 2022-01-10 NOTE — Discharge Instructions (Signed)

## 2022-01-10 NOTE — Anesthesia Procedure Notes (Signed)
Arterial Line Insertion Start/End1/24/2023 10:50 AM Performed by: Janene Harvey, CRNA  Patient location: Pre-op. Lidocaine 1% used for infiltration Right, radial was placed Catheter size: 20 G Hand hygiene performed  and maximum sterile barriers used  Allen's test indicative of satisfactory collateral circulation Attempts: 2 Procedure performed without using ultrasound guided technique. Following insertion, Biopatch and dressing applied. Post procedure assessment: unchanged  Patient tolerated the procedure well with no immediate complications. Additional procedure comments: Placed by Etter Sjogren.

## 2022-01-10 NOTE — Transfer of Care (Signed)
Immediate Anesthesia Transfer of Care Note  Patient: Mandy Webb  Procedure(s) Performed: TRANSCATHETER AORTIC VALVE REPLACEMENT, TRANSFEMORAL (Chest) INTRAOPERATIVE TRANSTHORACIC ECHOCARDIOGRAM (Chest) ULTRASOUND GUIDANCE FOR VASCULAR ACCESS (Bilateral: Groin)  Patient Location: Cath Lab  Anesthesia Type:MAC  Level of Consciousness: drowsy and patient cooperative  Airway & Oxygen Therapy: Patient Spontanous Breathing and Patient connected to face mask oxygen  Post-op Assessment: Report given to RN and Post -op Vital signs reviewed and stable  Post vital signs: Reviewed and stable  Last Vitals:  Vitals Value Taken Time  BP 121/57 (aline)   Temp    Pulse 60   Resp 11   SpO2 99%     Last Pain:  Vitals:   01/10/22 0943  TempSrc:   PainSc: 0-No pain         Complications: No notable events documented.

## 2022-01-10 NOTE — Progress Notes (Addendum)
°  [place] VALVE TEAM  Patient doing well s/p TAVR. She is hemodynamically stable but requiring 3 mcg of Levophed. Groin sites stable. ECG with paced rhythm. Temp wire in place. I turned pacer down to 30 and she continued to pace. She will require an ICU bed. Also K is low at 3. Will replete. Plan for early ambulation after bedrest completed and hopeful discharge over the next 24-48 hours.   Angelena Form PA-C  [facility]  Pager [phone]

## 2022-01-11 ENCOUNTER — Other Ambulatory Visit: Payer: Self-pay | Admitting: Physician Assistant

## 2022-01-11 ENCOUNTER — Encounter (HOSPITAL_COMMUNITY): Payer: Self-pay | Admitting: Cardiovascular Disease

## 2022-01-11 ENCOUNTER — Inpatient Hospital Stay (HOSPITAL_COMMUNITY): Payer: Medicare (Managed Care)

## 2022-01-11 DIAGNOSIS — Z954 Presence of other heart-valve replacement: Secondary | ICD-10-CM | POA: Diagnosis not present

## 2022-01-11 DIAGNOSIS — Z952 Presence of prosthetic heart valve: Secondary | ICD-10-CM

## 2022-01-11 DIAGNOSIS — I350 Nonrheumatic aortic (valve) stenosis: Secondary | ICD-10-CM | POA: Diagnosis not present

## 2022-01-11 LAB — GLUCOSE, CAPILLARY
Glucose-Capillary: 74 mg/dL (ref 70–99)
Glucose-Capillary: 87 mg/dL (ref 70–99)
Glucose-Capillary: 131 mg/dL — ABNORMAL HIGH (ref 70–99)
Glucose-Capillary: 125 mg/dL — ABNORMAL HIGH (ref 70–99)

## 2022-01-11 LAB — ECHOCARDIOGRAM COMPLETE
AR max vel: 1.18 cm2
AV Area VTI: 1.22 cm2
AV Area mean vel: 1.21 cm2
AV Mean grad: 5 mmHg
AV Peak grad: 10 mmHg
Ao pk vel: 1.58 m/s
Area-P 1/2: 2.57 cm2
Height: 59 in
Weight: 1954.16 oz

## 2022-01-11 LAB — BASIC METABOLIC PANEL WITH GFR
Anion gap: 6 (ref 5–15)
BUN: 6 mg/dL — ABNORMAL LOW (ref 8–23)
CO2: 28 mmol/L (ref 22–32)
Calcium: 8.1 mg/dL — ABNORMAL LOW (ref 8.9–10.3)
Chloride: 99 mmol/L (ref 98–111)
Creatinine, Ser: 0.65 mg/dL (ref 0.44–1.00)
GFR, Estimated: >60 mL/min (ref >60)
Glucose, Bld: 68 mg/dL — ABNORMAL LOW (ref 70–99)
Potassium: 4 mmol/L (ref 3.5–5.1)
Sodium: 133 mmol/L — ABNORMAL LOW (ref 135–145)

## 2022-01-11 LAB — CBC
HCT: 39.5 % (ref 36.0–46.0)
Hemoglobin: 13.3 g/dL (ref 12.0–15.0)
MCH: 26.9 pg (ref 26.0–34.0)
MCHC: 33.7 g/dL (ref 30.0–36.0)
MCV: 79.8 fL — ABNORMAL LOW (ref 80.0–100.0)
Platelets: 90 K/uL — ABNORMAL LOW (ref 150–400)
RBC: 4.95 MIL/uL (ref 3.87–5.11)
RDW: 13.5 % (ref 11.5–15.5)
WBC: 5 K/uL (ref 4.0–10.5)
nRBC: 0 % (ref 0.0–0.2)

## 2022-01-11 LAB — MAGNESIUM: Magnesium: 1.8 mg/dL (ref 1.7–2.4)

## 2022-01-11 MED ORDER — MELATONIN 5 MG PO TABS
10.0000 mg | ORAL_TABLET | Freq: Every evening | ORAL | Status: DC | PRN
  Administered 2022-01-11: 21:00:00 10 mg via ORAL
  Filled 2022-01-11: qty 2

## 2022-01-11 MED FILL — Heparin Sodium (Porcine) Inj 1000 Unit/ML: Qty: 1000 | Status: AC

## 2022-01-11 MED FILL — Magnesium Sulfate Inj 50%: INTRAMUSCULAR | Qty: 10 | Status: AC

## 2022-01-11 MED FILL — Potassium Chloride Inj 2 mEq/ML: INTRAVENOUS | Qty: 40 | Status: AC

## 2022-01-11 NOTE — Progress Notes (Signed)
CSW spoke with patients case worker Marcie Bal at [employer]. Please contact her for patients dc planning needs.Contact info [phone] or [phone]. CSW will continue to follow and assist with patients dc planning needs.

## 2022-01-11 NOTE — Progress Notes (Signed)
Pt arrived to 4e from [facility]. Pt oriented to room and staff. Vitals obtained. CCMD notified x2 verifiers.

## 2022-01-11 NOTE — Progress Notes (Addendum)
[place] VALVE TEAM  Patient Name: Mandy Webb Date of Encounter: 01/11/2022  Admit date: 01/10/2022  Primary Care Provider: [name], MD [facility] Cardiologist: Freada Bergeron, MD / Dr. Burt Knack & Dr. Cyndia Bent (TAVR) [facility] Electrophysiologist:  None   [facility] Problem List     Principal Problem:   S/P TAVR (transcatheter aortic valve replacement) Active Problems:   HEPATITIS C   Hypothyroidism   DM (diabetes mellitus), type 2 (HCC)   Essential hypertension   GERD   Severe aortic stenosis   Neuropathy     Subjective   No complaints. Much more alert this AM. Daughter at bedside.   Inpatient Medications    Scheduled Meds:  aspirin  81 mg Oral Daily   Chlorhexidine Gluconate Cloth  6 each Topical Q0600   donepezil  10 mg Oral QHS   famotidine  10 mg Oral BID   insulin aspart  0-24 Units Subcutaneous TID AC & HS   levothyroxine  100 mcg Oral QAC breakfast   mupirocin ointment  1 application Nasal BID   rosuvastatin  40 mg Oral Daily   sodium chloride flush  3 mL Intravenous Q12H   Continuous Infusions:  sodium chloride     nitroGLYCERIN     PRN Meds: sodium chloride, acetaminophen **OR** acetaminophen, albuterol, morphine injection, ondansetron (ZOFRAN) IV, oxyCODONE, sodium chloride flush, traMADol   Vital Signs    Vitals:   01/11/22 0400 01/11/22 0500 01/11/22 0600 01/11/22 0640  BP: 137/61 (!) 126/108 101/64   Pulse: 61 66 66   Resp: 10 17 15    Temp:    97.9 F (36.6 C)  TempSrc: Oral   Oral  SpO2: 100% 93% 92%   Weight:  55.4 kg    Height:        Intake/Output Summary (Last 24 hours) at 01/11/2022 0836 Last data filed at 01/11/2022 0600 Gross per 24 hour  Intake 2184.02 ml  Output 1380 ml  Net 804.02 ml   Filed Weights   01/10/22 0920 01/11/22 0500  Weight: 52.2 kg 55.4 kg    Physical Exam    GEN: Well nourished, well developed, in no acute distress.  HEENT: Grossly  normal.  Neck: Supple, no JVD, carotid bruits, or masses. Cardiac: RRR, no murmurs, rubs, or gallops. No clubbing, cyanosis, edema.   Respiratory:  Respirations regular and unlabored, clear to auscultation bilaterally. GI: Soft, nontender, nondistended, BS + x 4. MS: no deformity or atrophy. Skin: warm and dry, no rash.  Groin sites clear without hematoma or ecchymosis. Temp wire pulled out of left leg.  Neuro:  Strength and sensation are intact. Psych: AAOx3.  Normal affect.  Labs    CBC Recent Labs    01/09/22 1016 01/10/22 1151 01/10/22 1444 01/11/22 0541  WBC 3.5*  --   --  5.0  HGB 15.2*   < > 13.9 13.3  HCT 45.2   < > 41.0 39.5  MCV 79.2*  --   --  79.8*  PLT 165  --   --  90*   < > = values in this interval not displayed.   Basic Metabolic Panel Recent Labs    01/09/22 1016 01/10/22 1151 01/10/22 1444 01/11/22 0500  NA 138   < > 140 133*  K 3.4*   < > 3.0* 4.0  CL 100   < > 98 99  CO2 29  --   --  28  GLUCOSE 121*   < >  169* 68*  BUN 7*   < > 10 6*  CREATININE 0.82   < > 0.60 0.65  CALCIUM 8.9  --   --  8.1*  MG  --   --   --  1.8   < > = values in this interval not displayed.   Liver Function Tests Recent Labs    01/09/22 1016  AST 31  ALT 24  ALKPHOS 64  BILITOT 0.9  PROT 6.5  ALBUMIN 3.4*   No results for input(s): LIPASE, AMYLASE in the last 72 hours. Cardiac Enzymes No results for input(s): CKTOTAL, CKMB, CKMBINDEX, TROPONINI in the last 72 hours. BNP Invalid input(s): POCBNP D-Dimer No results for input(s): DDIMER in the last 72 hours. Hemoglobin A1C No results for input(s): HGBA1C in the last 72 hours. Fasting Lipid Panel No results for input(s): CHOL, HDL, LDLCALC, TRIG, CHOLHDL, LDLDIRECT in the last 72 hours. Thyroid Function Tests No results for input(s): TSH, T4TOTAL, T3FREE, THYROIDAB in the last 72 hours.  Invalid input(s): FREET3  Telemetry    Sinus with some pacing overnight - Personally Reviewed  ECG    AV paced -  Personally Reviewed  Radiology    DG CHEST PORT 1 VIEW  Result Date: 01/10/2022 CLINICAL DATA:  Preop TAVR EXAM: PORTABLE CHEST 1 VIEW COMPARISON:  03/03/2013 FINDINGS: No focal consolidation. No pleural effusion or pneumothorax. Heart and mediastinal contours are unremarkable. Thoracic aortic atherosclerosis. No acute osseous abnormality. Old posterior left rib fractures. IMPRESSION: No active disease. Electronically Signed   By: Kathreen Devoid M.D.   On: 01/10/2022 09:33   ECHOCARDIOGRAM LIMITED  Result Date: 01/10/2022    ECHOCARDIOGRAM LIMITED REPORT   Patient Name:   Mandy Webb Date of Exam: 01/10/2022 Medical Rec #:  [id number]       Height:       59.0 in Accession #:    [id number]      Weight:       115.0 lb Date of Birth:  March 10, 1946       BSA:          1.458 m Patient Age:    76 years        BP:           141/58 mmHg Patient Gender: F               HR:           60 bpm. Exam Location:  Inpatient Procedure: Limited Echo, Color Doppler and Cardiac Doppler Indications:     Aortic Stenosis i35.0  History:         Patient has prior history of Echocardiogram examinations, most                  recent 11/09/2021. Risk Factors:Hypertension, Diabetes and                  Dyslipidemia.  Sonographer:     Raquel Sarna Senior RDCS Referring Phys:  [place] Diagnosing Phys: Rudean Haskell MD  Sonographer Comments: 54mm Edwards S3U TAVR Implanted IMPRESSIONS  1. Interventional TTE.  2. Prior to the procedure, the aortic valve was calcified. Aortic valve regurgitation was not visualized. Severe aortic stenosis: mean gradient 28 mm Hg, peak gradient 45 mm HG, DVI 0.22, AVA 0.54 cm2.  3. After procedure, the aortic valve was replaced with a 23 mm Sapien valve. No paravalvular leak. Mean gradient of 3 mm Hg, Peak gradient 4 mm Hg, DVI 0.76 and EOA 1.6 cm2.  4. Left ventricular ejection fraction, by estimation, is 60 to 65%. The left ventricle has normal function. The left ventricle has no regional wall  motion abnormalities.  5. Right ventricular systolic function is normal. The right ventricular size is normal.  6. The mitral valve is grossly normal. No evidence of mitral valve regurgitation. Comparison(s): Successful prosthetic valve placement. FINDINGS  Left Ventricle: Left ventricular ejection fraction, by estimation, is 60 to 65%. The left ventricle has normal function. The left ventricle has no regional wall motion abnormalities. The left ventricular internal cavity size was normal in size. There is  no left ventricular hypertrophy. Right Ventricle: The right ventricular size is normal. Right ventricular systolic function is normal. Mitral Valve: The mitral valve is grossly normal. Tricuspid Valve: The tricuspid valve is normal in structure. Tricuspid valve regurgitation is not demonstrated. Aortic Valve: The aortic valve is calcified. Aortic valve regurgitation is not visualized. Aortic valve mean gradient measures 3.0 mmHg. Aortic valve peak gradient measures 4.4 mmHg. Aortic valve area, by VTI measures 1.56 cm. LEFT VENTRICLE PLAX 2D LVOT diam:     1.80 cm LV SV:         40 LV SV Index:   28 LVOT Area:     2.54 cm  AORTIC VALVE AV Area (Vmax):    1.95 cm AV Area (Vmean):   2.08 cm AV Area (VTI):     1.56 cm AV Vmax:           105.00 cm/s AV Vmean:          78.700 cm/s AV VTI:            0.257 m AV Peak Grad:      4.4 mmHg AV Mean Grad:      3.0 mmHg LVOT Vmax:         80.30 cm/s LVOT Vmean:        64.300 cm/s LVOT VTI:          0.158 m LVOT/AV VTI ratio: 0.61  SHUNTS Systemic VTI:  0.16 m Systemic Diam: 1.80 cm Rudean Haskell MD Electronically signed by Rudean Haskell MD Signature Date/Time: 01/10/2022/5:12:47 PM    Final    Structural Heart Procedure  Result Date: 01/10/2022 See surgical note for result.   Cardiac Studies   TAVR OPERATIVE NOTE     Date of Procedure:                01/10/2022   Preoperative Diagnosis:      Severe Aortic Stenosis    Postoperative Diagnosis:     Same    Procedure:        Transcatheter Aortic Valve Replacement - Percutaneous Right Transfemoral Approach             Edwards Sapien 3 Ultra THV (size 23 mm, model # 9755RSL, serial # [id number])              Co-Surgeons:                        [name], MD and Sherren Mocha, MD       Anesthesiologist:                  Raechel Ache, MD   Echocardiographer:              Osborne Oman, MD   Pre-operative Echo Findings: Severe aortic stenosis Normal left ventricular systolic function   Post-operative Echo Findings: No paravalvular leak Normal  left ventricular systolic function   ____________________________  Echo 01/11/22: pending  Patient Profile     Danahi Reddish is a 76 y.o. female with a history of HTN, HLD, DMT2, GERD, mild dementia and severe AS who presented to [facility] on 01/10/22 for planned TAVR.  Assessment & Plan    Severe AS: s/p successful TAVR with a 23 mm Edwards Sapien 3 Ultra Resilia THV via the TF approach on 01/13/22. Post operative echo pending. Groin sites are stable. ECG with paced rhythm but now in sinus rhythm ( no new ECG this AM). She has been started on a baby aspirin. Transfer to 4E.   Sinus bradycardia: her temp wire was left in overnight given markedly low HRs (20-30s) associated with hypotension. She required overnight observation in the ICU with levophed support. She paced a little overnight but now maintaining sinus in the 50s and 60s. I pulled her temp wire and we will transfer her to 4E for another day of observation. Will DC with a Zio patch.   HTN: BP now stable and off pressors. Will add back home antihypertensives as needed.   DMT2: continue SSI while admitted.   Dementia: she looks better this morning and more alert. Daughter at bedside. She will be going home with her to stay in [place] (previously lived alone)    Signed, Angelena Form, PA-C  01/11/2022, 8:36 AM  Pager [phone]   Chart reviewed, patient examined, agree with  above. She looks more alert this am. Rhythm this am is sinus 50-60's. ECG shows sinus 64 with wide QRS 186 ms. Preop 84 ms. Pacer removed this am and will continue to observe on telemetry. Ambulate.

## 2022-01-12 ENCOUNTER — Inpatient Hospital Stay (HOSPITAL_COMMUNITY)
Admission: RE | Admit: 2022-01-12 | Discharge: 2022-01-12 | Disposition: A | Discharge status: Home / Self Care | Payer: Medicare (Managed Care) | Source: Home / Self Care | Attending: Physician Assistant | Admitting: Physician Assistant

## 2022-01-12 DIAGNOSIS — Z952 Presence of prosthetic heart valve: Secondary | ICD-10-CM

## 2022-01-12 DIAGNOSIS — R001 Bradycardia, unspecified: Secondary | ICD-10-CM

## 2022-01-12 LAB — CBC
HCT: 37.6 % (ref 36.0–46.0)
Hemoglobin: 12.9 g/dL (ref 12.0–15.0)
MCH: 27.3 pg (ref 26.0–34.0)
MCHC: 34.3 g/dL (ref 30.0–36.0)
MCV: 79.5 fL — ABNORMAL LOW (ref 80.0–100.0)
Platelets: 71 10*3/uL — ABNORMAL LOW (ref 150–400)
RBC: 4.73 MIL/uL (ref 3.87–5.11)
RDW: 13.5 % (ref 11.5–15.5)
WBC: 5.2 10*3/uL (ref 4.0–10.5)
nRBC: 0 % (ref 0.0–0.2)

## 2022-01-12 LAB — BASIC METABOLIC PANEL
Anion gap: 8 (ref 5–15)
BUN: 9 mg/dL (ref 8–23)
CO2: 29 mmol/L (ref 22–32)
Calcium: 8.4 mg/dL — ABNORMAL LOW (ref 8.9–10.3)
Chloride: 102 mmol/L (ref 98–111)
Creatinine, Ser: 0.65 mg/dL (ref 0.44–1.00)
GFR, Estimated: >60 mL/min (ref >60)
Glucose, Bld: 71 mg/dL (ref 70–99)
Potassium: 3.9 mmol/L (ref 3.5–5.1)
Sodium: 139 mmol/L (ref 135–145)

## 2022-01-12 LAB — GLUCOSE, CAPILLARY: Glucose-Capillary: 76 mg/dL (ref 70–99)

## 2022-01-12 MED ORDER — ASPIRIN 81 MG PO CHEW
81.0000 mg | CHEWABLE_TABLET | Freq: Every day | ORAL | Status: AC
Start: 2022-01-12 — End: ?

## 2022-01-12 NOTE — Plan of Care (Signed)
  Problem: Education: Goal: Knowledge of General Education information will improve Description: Including pain rating scale, medication(s)/side effects and non-pharmacologic comfort measures Outcome: Adequate for Discharge   

## 2022-01-12 NOTE — Discharge Summary (Signed)
[place] VALVE TEAM  Discharge Summary    Patient ID: Mandy Webb MRN: [id number]; DOB: 06-08-1946  Admit date: 01/10/2022 Discharge date: 01/12/2022  Primary Care Provider: [name], MD  Primary Cardiologist: Freada Bergeron, MD / Dr. Burt Knack & Dr. Cyndia Bent (TAVR)  Discharge Diagnoses    Principal Problem:   S/P TAVR (transcatheter aortic valve replacement) Active Problems:   HEPATITIS C   Hypothyroidism   DM (diabetes mellitus), type 2 (HCC)   Essential hypertension   GERD   Severe aortic stenosis   Neuropathy   Allergies Allergies  Allergen Reactions   Protonix [Pantoprazole Sodium] Cough    Diagnostic Studies/Procedures    TAVR OPERATIVE NOTE     Date of Procedure:                01/10/2022   Preoperative Diagnosis:      Severe Aortic Stenosis    Postoperative Diagnosis:    Same    Procedure:        Transcatheter Aortic Valve Replacement - Percutaneous Right Transfemoral Approach             Edwards Sapien 3 Ultra THV (size 23 mm, model # 9755RSL, serial # [id number])              Co-Surgeons:                        [name], MD and Sherren Mocha, MD       Anesthesiologist:                  Raechel Ache, MD   Echocardiographer:              Osborne Oman, MD   Pre-operative Echo Findings: Severe aortic stenosis Normal left ventricular systolic function   Post-operative Echo Findings: No paravalvular leak Normal left ventricular systolic function     ____________________________   Echo 01/11/22:  IMPRESSIONS   1. The aortic valve has been replaced by a . Aortic valve regurgitation  is trivial (paravalular leak at the 12 o'clock position on PSAX sweep).  There is a 23 mm Sampien valve present in the aortic position. Effective  orifice area, by VTI measures 1.22  cm. Aortic valve mean gradient measures 5.0 mmHg. DVI 0.54. Acceleration  time 69 ms.   2. Left ventricular ejection  fraction, by estimation, is 55 to 60%. The  left ventricle has normal function. The left ventricle demonstrates  regional wall motion abnormalities (abnormal septal motion consistent with  LBBB).   3. Right ventricular systolic function is normal. The right ventricular  size is normal.   4. The mitral valve is grossly normal. Mild mitral valve regurgitation.  No evidence of mitral stenosis.   5. The inferior vena cava is normal in size with <50% respiratory  variability, suggesting right atrial pressure of 8 mmHg.   Comparison(s): Stable prosthetic function with trivial PVL.   History of Present Illness     Mandy Webb is a 76 y.o. female with a history of HTN, HLD, DMT2, GERD, mild dementia and severe AS who presented to [facility] on 01/10/22 for planned TAVR.  Patient had been followed in our clinic for moderate AS. She was added onto Dr. Jacolyn Reedy schedule in November 2022 for evaluation of presyncope. Echo was updated on 11/09/21 which showed progression of aortic stenosis to severe. The mean gradient was 27 mmHg with a peak gradient of  46 mmHg. Aortic valve area was 0.55 cm with a dimensionless index of 0.17 and a stroke-volume index that was low at 31. Left ventricular ejection fraction was 60 to 65% with grade 1 diastolic dysfunction. She underwent cardiac catheterization on 11/30/2021 showing mild diffuse nonobstructive coronary disease.  Filling pressures and cardiac index were within normal limits.  The patient has been evaluated by the multidisciplinary valve team and felt to have severe, symptomatic aortic stenosis and to be a suitable candidate for TAVR, which was set up for 01/10/22.   Hospital Course     Consultants: none   Severe AS: s/p successful TAVR with a 23 mm Edwards Sapien 3 Ultra Resilia THV via the TF approach on 01/13/22. Post operative echo showed EF 55%, normally functioning TAVR with a mean gradient of 5 mmHg and trivial PVL. Groin sites are stable. ECG with sinus  and no HAVB. She has been started on a baby aspirin. Plan for discharge home with her daughter today with close follow up in the office next week.    Sinus bradycardia: her temp wire was left in overnight given markedly low HRs (20-30s) associated with hypotension. She required overnight observation in the ICU with levophed support. Her rhythm recovered and temp wire was removed. She has been monitored for over 48 hours with no recurrent bradycardia. Plan to discharge home today with a Zio Patch to monitor for recurrent bradyarrhythmias.    HTN: BP recovered. Resumed on home meds.   DMT2: continue SSI while admitted. Resume home meds at discharge    Dementia: she looks better this morning and more alert. Daughter at bedside. She will be going home with her to stay in [place] (previously lived alone)   _____________  Discharge Vitals Blood pressure (!) 152/60, pulse 88, temperature 97.6 F (36.4 C), temperature source Oral, resp. rate 16, height 4\' 11"  (1.499 m), weight 54.4 kg, SpO2 94 %.  Filed Weights   01/10/22 0920 01/11/22 0500 01/12/22 0600  Weight: 52.2 kg 55.4 kg 54.4 kg     GEN: Well nourished, well developed, in no acute distress HEENT: normal Neck: no JVD or masses Cardiac: RRR; no murmurs, rubs, or gallops,no edema  Respiratory:  clear to auscultation bilaterally, normal work of breathing GI: soft, nontender, nondistended, + BS MS: no deformity or atrophy Skin: warm and dry, no rash.  Groin sites clear without hematoma or ecchymosis  Neuro:  Alert and Oriented x 3, Strength and sensation are intact Psych: euthymic mood, full affect   Labs & Radiologic Studies    CBC Recent Labs    01/11/22 0541 01/12/22 0229  WBC 5.0 5.2  HGB 13.3 12.9  HCT 39.5 37.6  MCV 79.8* 79.5*  PLT 90* 71*   Basic Metabolic Panel Recent Labs    01/11/22 0500 01/12/22 0229  NA 133* 139  K 4.0 3.9  CL 99 102  CO2 28 29  GLUCOSE 68* 71  BUN 6* 9  CREATININE 0.65 0.65   CALCIUM 8.1* 8.4*  MG 1.8  --    Liver Function Tests Recent Labs    01/09/22 1016  AST 31  ALT 24  ALKPHOS 64  BILITOT 0.9  PROT 6.5  ALBUMIN 3.4*   No results for input(s): LIPASE, AMYLASE in the last 72 hours. Cardiac Enzymes No results for input(s): CKTOTAL, CKMB, CKMBINDEX, TROPONINI in the last 72 hours. BNP Invalid input(s): POCBNP D-Dimer No results for input(s): DDIMER in the last 72 hours. Hemoglobin A1C No results for input(s):  HGBA1C in the last 72 hours. Fasting Lipid Panel No results for input(s): CHOL, HDL, LDLCALC, TRIG, CHOLHDL, LDLDIRECT in the last 72 hours. Thyroid Function Tests No results for input(s): TSH, T4TOTAL, T3FREE, THYROIDAB in the last 72 hours.  Invalid input(s): FREET3 _____________  DG CHEST PORT 1 VIEW  Result Date: 01/10/2022 CLINICAL DATA:  Preop TAVR EXAM: PORTABLE CHEST 1 VIEW COMPARISON:  03/03/2013 FINDINGS: No focal consolidation. No pleural effusion or pneumothorax. Heart and mediastinal contours are unremarkable. Thoracic aortic atherosclerosis. No acute osseous abnormality. Old posterior left rib fractures. IMPRESSION: No active disease. Electronically Signed   By: Kathreen Devoid M.D.   On: 01/10/2022 09:33   ECHOCARDIOGRAM COMPLETE  Result Date: 01/11/2022    ECHOCARDIOGRAM REPORT   Patient Name:   Mandy Webb Date of Exam: 01/11/2022 Medical Rec #:  [id number]       Height:       59.0 in Accession #:    [id number]      Weight:       122.1 lb Date of Birth:  11/17/1946       BSA:          1.496 m Patient Age:    23 years        BP:           101/64 mmHg Patient Gender: F               HR:           60 bpm. Exam Location:  Inpatient Procedure: 2D Echo, Cardiac Doppler and Color Doppler Indications:    Post TAVR  History:        Patient has prior history of Echocardiogram examinations, most                 recent 01/10/2022. Aortic Valve Disease; Risk Factors:Diabetes.                 Aortic Valve: 23 mm Sampien valve is present in the  aortic                 position.  Sonographer:    Glo Herring Referring Phys: [id number] [place]  1. The aortic valve has been replaced by a . Aortic valve regurgitation is trivial (paravalular leak at the 12 o'clock position on PSAX sweep). There is a 23 mm Sampien valve present in the aortic position. Effective orifice area, by VTI measures 1.22 cm. Aortic valve mean gradient measures 5.0 mmHg. DVI 0.54. Acceleration time 69 ms.  2. Left ventricular ejection fraction, by estimation, is 55 to 60%. The left ventricle has normal function. The left ventricle demonstrates regional wall motion abnormalities (abnormal septal motion consistent with LBBB).  3. Right ventricular systolic function is normal. The right ventricular size is normal.  4. The mitral valve is grossly normal. Mild mitral valve regurgitation. No evidence of mitral stenosis.  5. The inferior vena cava is normal in size with <50% respiratory variability, suggesting right atrial pressure of 8 mmHg. Comparison(s): Stable prosthetic function with trivial PVL. FINDINGS  Left Ventricle: Left ventricular ejection fraction, by estimation, is 55 to 60%. The left ventricle has normal function. The left ventricle demonstrates regional wall motion abnormalities. The left ventricular internal cavity size was normal in size. There is mild concentric left ventricular hypertrophy. Abnormal (paradoxical) septal motion, consistent with left bundle branch block. Left ventricular diastolic parameters are indeterminate. Elevated left atrial pressure. Right Ventricle: The right ventricular size is normal. No increase  in right ventricular wall thickness. Right ventricular systolic function is normal. Left Atrium: Left atrial size was normal in size. Right Atrium: Right atrial size was normal in size. Pericardium: Trivial pericardial effusion is present. Mitral Valve: The mitral valve is grossly normal. Mild mitral valve regurgitation. No evidence of  mitral valve stenosis. Tricuspid Valve: The tricuspid valve is normal in structure. Tricuspid valve regurgitation is not demonstrated. No evidence of tricuspid stenosis. Aortic Valve: 12. The aortic valve has been repaired/replaced. Aortic valve regurgitation is trivial. Aortic valve mean gradient measures 5.0 mmHg. Aortic valve peak gradient measures 10.0 mmHg. Aortic valve area, by VTI measures 1.22 cm. There is a 23 mm Sampien valve present in the aortic position. Pulmonic Valve: The pulmonic valve was not well visualized. Pulmonic valve regurgitation is mild to moderate. No evidence of pulmonic stenosis. Aorta: The aortic root is normal in size and structure. Venous: The inferior vena cava is normal in size with less than 50% respiratory variability, suggesting right atrial pressure of 8 mmHg. IAS/Shunts: No atrial level shunt detected by color flow Doppler.  LEFT VENTRICLE PLAX 2D LVOT diam:     1.70 cm   Diastology LV SV:         41        LV e' medial:    4.35 cm/s LV SV Index:   28        LV E/e' medial:  18.7 LVOT Area:     2.27 cm  LV e' lateral:   4.80 cm/s                          LV E/e' lateral: 17.0  RIGHT VENTRICLE            IVC RV Basal diam:  3.10 cm    IVC diam: 1.90 cm RV S prime:     6.68 cm/s LEFT ATRIUM             Index        RIGHT ATRIUM           Index LA Vol (A2C):   32.6 ml 21.80 ml/m  RA Area:     10.10 cm LA Vol (A4C):   21.0 ml 14.04 ml/m  RA Volume:   18.90 ml  12.64 ml/m LA Biplane Vol: 27.3 ml 18.25 ml/m  AORTIC VALVE                     PULMONIC VALVE AV Area (Vmax):    1.18 cm      PV Vmax:       1.32 m/s AV Area (Vmean):   1.21 cm      PV Peak grad:  7.0 mmHg AV Area (VTI):     1.22 cm AV Vmax:           157.75 cm/s AV Vmean:          107.000 cm/s AV VTI:            0.338 m AV Peak Grad:      10.0 mmHg AV Mean Grad:      5.0 mmHg LVOT Vmax:         82.08 cm/s LVOT Vmean:        56.950 cm/s LVOT VTI:          0.182 m LVOT/AV VTI ratio: 0.54 MITRAL VALVE MV Area (PHT):  2.57 cm     SHUNTS MV Decel Time: 563  msec     Systemic VTI:  0.18 m MV E velocity: 81.40 cm/s   Systemic Diam: 1.70 cm MV A velocity: 111.00 cm/s MV E/A ratio:  0.73 Rudean Haskell MD Electronically signed by Rudean Haskell MD Signature Date/Time: 01/11/2022/1:17:25 PM    Final    ECHOCARDIOGRAM LIMITED  Result Date: 01/10/2022    ECHOCARDIOGRAM LIMITED REPORT   Patient Name:   Mandy Webb Date of Exam: 01/10/2022 Medical Rec #:  [id number]       Height:       59.0 in Accession #:    [id number]      Weight:       115.0 lb Date of Birth:  07/26/1946       BSA:          1.458 m Patient Age:    52 years        BP:           141/58 mmHg Patient Gender: F               HR:           60 bpm. Exam Location:  Inpatient Procedure: Limited Echo, Color Doppler and Cardiac Doppler Indications:     Aortic Stenosis i35.0  History:         Patient has prior history of Echocardiogram examinations, most                  recent 11/09/2021. Risk Factors:Hypertension, Diabetes and                  Dyslipidemia.  Sonographer:     Raquel Sarna Senior RDCS Referring Phys:  [place] Diagnosing Phys: Rudean Haskell MD  Sonographer Comments: 55mm Edwards S3U TAVR Implanted IMPRESSIONS  1. Interventional TTE.  2. Prior to the procedure, the aortic valve was calcified. Aortic valve regurgitation was not visualized. Severe aortic stenosis: mean gradient 28 mm Hg, peak gradient 45 mm HG, DVI 0.22, AVA 0.54 cm2.  3. After procedure, the aortic valve was replaced with a 23 mm Sapien valve. No paravalvular leak. Mean gradient of 3 mm Hg, Peak gradient 4 mm Hg, DVI 0.76 and EOA 1.6 cm2.  4. Left ventricular ejection fraction, by estimation, is 60 to 65%. The left ventricle has normal function. The left ventricle has no regional wall motion abnormalities.  5. Right ventricular systolic function is normal. The right ventricular size is normal.  6. The mitral valve is grossly normal. No evidence of mitral valve  regurgitation. Comparison(s): Successful prosthetic valve placement. FINDINGS  Left Ventricle: Left ventricular ejection fraction, by estimation, is 60 to 65%. The left ventricle has normal function. The left ventricle has no regional wall motion abnormalities. The left ventricular internal cavity size was normal in size. There is  no left ventricular hypertrophy. Right Ventricle: The right ventricular size is normal. Right ventricular systolic function is normal. Mitral Valve: The mitral valve is grossly normal. Tricuspid Valve: The tricuspid valve is normal in structure. Tricuspid valve regurgitation is not demonstrated. Aortic Valve: The aortic valve is calcified. Aortic valve regurgitation is not visualized. Aortic valve mean gradient measures 3.0 mmHg. Aortic valve peak gradient measures 4.4 mmHg. Aortic valve area, by VTI measures 1.56 cm. LEFT VENTRICLE PLAX 2D LVOT diam:     1.80 cm LV SV:         40 LV SV Index:   28 LVOT Area:     2.54 cm  AORTIC VALVE AV Area (Vmax):  1.95 cm AV Area (Vmean):   2.08 cm AV Area (VTI):     1.56 cm AV Vmax:           105.00 cm/s AV Vmean:          78.700 cm/s AV VTI:            0.257 m AV Peak Grad:      4.4 mmHg AV Mean Grad:      3.0 mmHg LVOT Vmax:         80.30 cm/s LVOT Vmean:        64.300 cm/s LVOT VTI:          0.158 m LVOT/AV VTI ratio: 0.61  SHUNTS Systemic VTI:  0.16 m Systemic Diam: 1.80 cm Rudean Haskell MD Electronically signed by Rudean Haskell MD Signature Date/Time: 01/10/2022/5:12:47 PM    Final    Structural Heart Procedure  Result Date: 01/10/2022 See surgical note for result.  Disposition   Pt is being discharged home today in good condition.  Follow-up Plans & Appointments     Follow-up Information     Eileen Stanford, PA-C. Go on 01/18/2022.   Specialties: Cardiology, Radiology Why: @ 3:30pm, please arrive at least 10 minutes early. Contact information: [address] [place]   [address] [phone]                  Discharge Medications   Allergies as of 01/12/2022       Reactions   Protonix [pantoprazole Sodium] Cough        Medication List     TAKE these medications    albuterol 108 (90 Base) MCG/ACT inhaler Commonly known as: VENTOLIN HFA Inhale 2 puffs into the lungs every 6 (six) hours as needed for wheezing (cough, shortness of breath or wheezing.).   Alpha-Lipoic Acid 600 MG Caps Take 600 mg by mouth daily.   amLODipine 10 MG tablet Commonly known as: NORVASC Take 10 mg by mouth daily.   aspirin 81 MG chewable tablet Chew 1 tablet (81 mg total) by mouth daily. Okay to swallow whole   donepezil 10 MG tablet Commonly known as: ARICEPT Take 10 mg by mouth at bedtime.   famotidine 10 MG tablet Commonly known as: PEPCID Take 10 mg by mouth 2 (two) times daily.   levothyroxine 100 MCG tablet Commonly known as: SYNTHROID Take 1 tablet (100 mcg total) by mouth daily before breakfast.   losartan 25 MG tablet Commonly known as: COZAAR Take 1 tablet (25 mg total) by mouth daily.   polyethylene glycol powder 17 GM/SCOOP powder Commonly known as: GLYCOLAX/MIRALAX TAKE 17 G BY MOUTH DAILY   PreserVision AREDS 2 Chew Chew 1 tablet by mouth daily.   Rocklatan 0.02-0.005 % Soln Generic drug: Netarsudil-Latanoprost Place 1 drop into both eyes at bedtime.   rosuvastatin 40 MG tablet Commonly known as: CRESTOR Take 40 mg by mouth daily.   senna-docusate 8.6-50 MG tablet Commonly known as: Senokot-S Take 1 tablet by mouth at bedtime.   Simbrinza 1-0.2 % Susp Generic drug: Brinzolamide-Brimonidine Apply 1 drop to eye in the morning, at noon, and at bedtime. What changed: when to take this         Outstanding Labs/Studies   none  Duration of Discharge Encounter   Greater than 30 minutes including physician time.  Mable Fill, PA-C 01/12/2022, 9:44 AM [phone]

## 2022-01-13 ENCOUNTER — Ambulatory Visit: Payer: Medicare (Managed Care) | Admitting: Cardiology

## 2022-01-13 ENCOUNTER — Telehealth: Payer: Self-pay | Admitting: Cardiology

## 2022-01-13 NOTE — Telephone Encounter (Signed)
°  [place] VALVE TEAM   Patient contacted regarding discharge from [facility] on 01/12/22   Patient understands to follow up with provider Nell Range, PA-C on 01/18/22 at 3:30pm at [place].  Patient understands discharge instructions? Yes  Patient understands medications and regimen? Yes  Patient understands to bring all medications to this visit? Yes   Kathyrn Drown NP-C Structural Heart Team  Pager: [phone]

## 2022-01-18 ENCOUNTER — Ambulatory Visit (INDEPENDENT_AMBULATORY_CARE_PROVIDER_SITE_OTHER): Payer: Medicare (Managed Care) | Admitting: Physician Assistant

## 2022-01-18 ENCOUNTER — Other Ambulatory Visit: Payer: Self-pay

## 2022-01-18 VITALS — BP 152/74 | HR 81 | Ht 59.0 in | Wt 120.2 lb

## 2022-01-18 DIAGNOSIS — Z952 Presence of prosthetic heart valve: Secondary | ICD-10-CM

## 2022-01-18 DIAGNOSIS — G309 Alzheimer's disease, unspecified: Secondary | ICD-10-CM

## 2022-01-18 DIAGNOSIS — R29898 Other symptoms and signs involving the musculoskeletal system: Secondary | ICD-10-CM

## 2022-01-18 DIAGNOSIS — R001 Bradycardia, unspecified: Secondary | ICD-10-CM | POA: Diagnosis not present

## 2022-01-18 DIAGNOSIS — I350 Nonrheumatic aortic (valve) stenosis: Secondary | ICD-10-CM

## 2022-01-18 DIAGNOSIS — I25119 Atherosclerotic heart disease of native coronary artery with unspecified angina pectoris: Secondary | ICD-10-CM

## 2022-01-18 DIAGNOSIS — F0280 Dementia in other diseases classified elsewhere without behavioral disturbance: Secondary | ICD-10-CM

## 2022-01-18 DIAGNOSIS — I10 Essential (primary) hypertension: Secondary | ICD-10-CM | POA: Diagnosis not present

## 2022-01-18 MED ORDER — AMOXICILLIN 500 MG PO CAPS: ORAL_CAPSULE | ORAL | 3 refills | Status: AC

## 2022-01-18 NOTE — Patient Instructions (Signed)
Medication Instructions:  Your physician has recommended you make the following change in your medication:  1.Start amoxicillin 500 mg, take 4 capsules by mouth 1 hour prior to dental appointments *If you need a refill on your cardiac medications before your next appointment, please call your pharmacy*   Lab Work: None If you have labs (blood work) drawn today and your tests are completely normal, you will receive your results only by: [place] (if you have MyChart) OR A paper copy in the mail If you have any lab test that is abnormal or we need to change your treatment, we will call you to review the results.  Follow-Up: At [facility], you and your health needs are our priority.  As part of our continuing mission to provide you with exceptional heart care, we have created designated Provider Care Teams.  These Care Teams include your primary Cardiologist (physician) and Advanced Practice Providers (APPs -  Physician Assistants and Nurse Practitioners) who all work together to provide you with the care you need, when you need it.   Your next appointment:   02/17/2022 at 11:30 AM  The format for your next appointment:   In Person  Provider:   Nell Range, PA-C{

## 2022-01-18 NOTE — Progress Notes (Signed)
[facility] [place]                                     Cardiology Office Note:    Date:  01/19/2022   ID:  Mandy Webb, DOB 03-Aug-1946, MRN [id number]  PCP:  [name], MD  [facility] Cardiologist:  Freada Bergeron, MD / Dr. Burt Knack & Dr. Cyndia Bent (TAVR) [place] Electrophysiologist:  None   Referring MD: [name], MD   [facility] s/p TAVR  History of Present Illness:    Mandy Webb is a 76 y.o. female with a hx of HTN, HLD, DMT2, GERD, mild dementia and severe AS s/p TAVR (01/13/22) who presents to clinic for follow up.   Patient had been followed in our clinic for moderate AS. She was added onto Dr. Jacolyn Reedy schedule in November 2022 for evaluation of presyncope. Echo was updated on 11/09/21 which showed progression of aortic stenosis to severe. The mean gradient was 27 mmHg with a peak gradient of 46 mmHg. Aortic valve area was 0.55 cm with a dimensionless index of 0.17 and a stroke-volume index that was low at 31. Left ventricular ejection fraction was 60 to 65% with grade 1 diastolic dysfunction. She underwent cardiac catheterization on 11/30/2021 showing mild diffuse nonobstructive coronary disease.  Filling pressures and cardiac index were within normal limits.   She was evaluated by the multidisciplinary valve team and successful TAVR with a 23 mm Edwards Sapien 3 Ultra Resilia THV via the TF approach on 01/13/22. Post operative echo showed EF 55%, normally functioning TAVR with a mean gradient of 5 mmHg and trivial PVL. She had marked sinus bradycardia after TAVR and temp wire was left in place. Her heart rhythm improved in she was sent home with a Zio AT.  She was started on a baby aspirin.   Today the patient presents to clinic for follow up. Here with daughter who she lives with now. No CP. She continues to have SOB. No LE edema, orthopnea or PND. No dizziness or syncope. No blood in stool or urine. No  palpitations. Per her daughter, the patient has had progressive leg weakness since TAVR. She denies pain but just gives out when walking. Did have one fall the other night.   Past Medical History:  Diagnosis Date   Anemia    Arthritis    Asthma    Dementia ([place])    Depression    Diabetes mellitus    no meds, diet controlled   Full dentures    GERD (gastroesophageal reflux disease)    Glaucoma    bilateral eyes   Hearing loss    wears hearing aids   Hepatitis 1999   Hep A-Treated   History of CVA (cerebrovascular accident) 1999   Hyperlipidemia    Hypertension    Hypothyroidism    Neuropathy    S/P TAVR (transcatheter aortic valve replacement) 01/10/2022   s/p TAVR with a 17mm Edwards S3UR via the TF approach   Severe aortic stenosis    Wears glasses     Past Surgical History:  Procedure Laterality Date   APPENDECTOMY     COLONOSCOPY     INTRAOPERATIVE TRANSTHORACIC ECHOCARDIOGRAM N/A 01/10/2022   Procedure: INTRAOPERATIVE TRANSTHORACIC ECHOCARDIOGRAM;  Surgeon: Sherren Mocha, MD;  Location: [place];  Service: Open Heart Surgery;  Laterality: N/A;   PITUITARY SURGERY  RIGHT/LEFT HEART CATH AND CORONARY ANGIOGRAPHY N/A 11/30/2021   Procedure: RIGHT/LEFT HEART CATH AND CORONARY ANGIOGRAPHY;  Surgeon: Early Osmond, MD;  Location: [place] [facility];  Service: Cardiovascular;  Laterality: N/A;   TRANSCATHETER AORTIC VALVE REPLACEMENT, TRANSFEMORAL N/A 01/10/2022   Procedure: TRANSCATHETER AORTIC VALVE REPLACEMENT, TRANSFEMORAL;  Surgeon: Sherren Mocha, MD;  Location: [place];  Service: Open Heart Surgery;  Laterality: N/A;   TUBAL LIGATION     ULTRASOUND GUIDANCE FOR VASCULAR ACCESS Bilateral 01/10/2022   Procedure: ULTRASOUND GUIDANCE FOR VASCULAR ACCESS;  Surgeon: Sherren Mocha, MD;  Location: [place];  Service: Open Heart Surgery;  Laterality: Bilateral;    Current Medications: Current Meds  Medication Sig   albuterol (PROVENTIL HFA;VENTOLIN HFA) 108 (90 BASE)  MCG/ACT inhaler Inhale 2 puffs into the lungs every 6 (six) hours as needed for wheezing (cough, shortness of breath or wheezing.).   Alpha-Lipoic Acid 600 MG CAPS Take 600 mg by mouth daily.   amLODipine (NORVASC) 10 MG tablet Take 10 mg by mouth daily.   amoxicillin (AMOXIL) 500 MG capsule Take 4 capsules by mouth 1 hour prior to dental visits   aspirin 81 MG chewable tablet Chew 1 tablet (81 mg total) by mouth daily. Okay to swallow whole   Brinzolamide-Brimonidine (SIMBRINZA) 1-0.2 % SUSP Apply 1 drop to eye in the morning, at noon, and at bedtime. (Patient taking differently: Apply 1 drop to eye 2 (two) times daily.)   donepezil (ARICEPT) 10 MG tablet Take 10 mg by mouth at bedtime.   famotidine (PEPCID) 10 MG tablet Take 10 mg by mouth 2 (two) times daily.   levothyroxine (SYNTHROID) 200 MCG tablet    losartan (COZAAR) 25 MG tablet Take 1 tablet (25 mg total) by mouth daily.   polyethylene glycol powder (GLYCOLAX/MIRALAX) powder TAKE 17 G BY MOUTH DAILY   ROCKLATAN 0.02-0.005 % SOLN Place 1 drop into both eyes at bedtime.   rosuvastatin (CRESTOR) 40 MG tablet Take 40 mg by mouth daily.   senna-docusate (SENOKOT-S) 8.6-50 MG tablet Take 1 tablet by mouth at bedtime.     Allergies:   Protonix [pantoprazole sodium]   Social History   Socioeconomic History   Marital status: Single    Spouse name: Not on file   Number of children: 3   Years of education: Not on file   Highest education level: Not on file  Occupational History   Occupation: Retired    Fish farm manager: UNEMPLOYED  Tobacco Use   Smoking status: Former    Types: Cigarettes    Quit date: 02/02/1969    Years since quitting: 52.9   Smokeless tobacco: Never  Vaping Use   Vaping Use: Never used  Substance and Sexual Activity   Alcohol use: No    Alcohol/week: 0.0 standard drinks   Drug use: No   Sexual activity: Never    Birth control/protection: Post-menopausal, Surgical    Comment: Tubal Ligation  Other Topics Concern    Not on file  Social History Narrative   Lives alone;   1 daughter lives in [place]   1 son lives in [place] Strain: Not on file  Food Insecurity: Not on file  Transportation Needs: Not on file  Physical Activity: Not on file  Stress: Not on file  Social Connections: Not on file     Family History: The patient's family history includes Cancer in her mother; Heart disease in her brother; Hypertension in her father; Stomach  cancer in her mother. There is no history of Esophageal cancer, Colon polyps, or Rectal cancer.  ROS:   Please see the history of present illness.    All other systems reviewed and are negative.  EKGs/Labs/Other Studies Reviewed:    The following studies were reviewed today:  TAVR OPERATIVE NOTE     Date of Procedure:                01/10/2022   Preoperative Diagnosis:      Severe Aortic Stenosis    Postoperative Diagnosis:    Same    Procedure:        Transcatheter Aortic Valve Replacement - Percutaneous Right Transfemoral Approach             Edwards Sapien 3 Ultra THV (size 23 mm, model # 9755RSL, serial # [id number])              Co-Surgeons:                        [name], MD and Sherren Mocha, MD       Anesthesiologist:                  Raechel Ache, MD   Echocardiographer:              Osborne Oman, MD   Pre-operative Echo Findings: Severe aortic stenosis Normal left ventricular systolic function   Post-operative Echo Findings: No paravalvular leak Normal left ventricular systolic function     ____________________________   Echo 01/11/22:  IMPRESSIONS   1. The aortic valve has been replaced by a . Aortic valve regurgitation  is trivial (paravalular leak at the 12 o'clock position on PSAX sweep).  There is a 23 mm Sampien valve present in the aortic position. Effective  orifice area, by VTI measures 1.22  cm. Aortic valve mean gradient measures 5.0 mmHg. DVI 0.54.  Acceleration  time 69 ms.   2. Left ventricular ejection fraction, by estimation, is 55 to 60%. The  left ventricle has normal function. The left ventricle demonstrates  regional wall motion abnormalities (abnormal septal motion consistent with  LBBB).   3. Right ventricular systolic function is normal. The right ventricular  size is normal.   4. The mitral valve is grossly normal. Mild mitral valve regurgitation.  No evidence of mitral stenosis.   5. The inferior vena cava is normal in size with <50% respiratory  variability, suggesting right atrial pressure of 8 mmHg.   Comparison(s): Stable prosthetic function with trivial PVL.   EKG:  EKG is ordered today.  The ekg ordered today demonstrates sinus with HR 81  Recent Labs: 01/09/2022: ALT 24; B Natriuretic Peptide 97.8 01/11/2022: Magnesium 1.8 01/12/2022: BUN 9; Creatinine, Ser 0.65; Hemoglobin 12.9; Platelets 71; Potassium 3.9; Sodium 139  Recent Lipid Panel    Component Value Date/Time   CHOL 193 08/19/2021 1108   TRIG 135 08/19/2021 1108   HDL 65 08/19/2021 1108   CHOLHDL 3.0 08/19/2021 1108   CHOLHDL 3.6 12/16/2014 1005   VLDL 33 12/16/2014 1005   LDLCALC 104 (H) 08/19/2021 1108   LDLDIRECT 89 03/23/2015 1620     Risk Assessment/Calculations:       Physical Exam:    VS:  BP (!) 152/74 (BP Location: Right Arm, Patient Position: Sitting, Cuff Size: Normal)    Pulse 81    Ht 4\' 11"  (1.499 m)    Wt 120 lb 3.2 oz (54.5 kg)  SpO2 98%    BMI 24.28 kg/m     Wt Readings from Last 3 Encounters:  01/18/22 120 lb 3.2 oz (54.5 kg)  01/12/22 119 lb 14.9 oz (54.4 kg)  01/09/22 115 lb (52.2 kg)     GEN:  Well nourished, well developed in no acute distress HEENT: Normal NECK: No JVD LYMPHATICS: No lymphadenopathy CARDIAC: RRR, no murmurs, rubs, gallops RESPIRATORY:  Clear to auscultation without rales, wheezing or rhonchi  ABDOMEN: Soft, non-tender, non-distended MUSCULOSKELETAL:  No edema; No deformity  SKIN: Warm and  dry NEUROLOGIC:  Alert and oriented x 3 PSYCHIATRIC:  pt a little blunted, daughter does most of talking  ASSESSMENT:    1. S/P TAVR (transcatheter aortic valve replacement)   2. Sinus bradycardia   3. Essential hypertension   4. Alzheimer's dementia, unspecified dementia severity, unspecified timing of dementia onset, unspecified whether behavioral, psychotic, or mood disturbance or anxiety ([place])   5. Weakness of both lower extremities    PLAN:    In order of problems listed above:  Severe AS s/p TAVR: doing okay 1 week out from TAVR. Having some new leg weakness. Groin sites healing well. ECG with no HAVB. SBE prophylaxis discussed; I have RX'd amoxicillin. Continue on baby aspirin. Will see back for 1 month follow up with echo   Sinus bradycardia: ECG with NSR today. Wearing a a Zio Patch with no recurrent bradyarrhythmias.    HTN: BP a little high today but will allow for a little permissive HTN given age and frailty.   Dementia: she seems to be doing okay from this standpoint. Daughter does most of the talking     Leg weakness: bilateral. Not typical after TAVR. Strength seems okay today. She just gives out when walking. Will consider LE arterial dopplers if this doesn't improve.   Medication Adjustments/Labs and Tests Ordered: Current medicines are reviewed at length with the patient today.  Concerns regarding medicines are outlined above.  Orders Placed This Encounter  Procedures   EKG 12-Lead   Meds ordered this encounter  Medications   amoxicillin (AMOXIL) 500 MG capsule    Sig: Take 4 capsules by mouth 1 hour prior to dental visits    Dispense:  4 capsule    Refill:  3    Patient Instructions  Medication Instructions:  Your physician has recommended you make the following change in your medication:  1.Start amoxicillin 500 mg, take 4 capsules by mouth 1 hour prior to dental appointments *If you need a refill on your cardiac medications before your next  appointment, please call your pharmacy*   Lab Work: None If you have labs (blood work) drawn today and your tests are completely normal, you will receive your results only by: [place] (if you have MyChart) OR A paper copy in the mail If you have any lab test that is abnormal or we need to change your treatment, we will call you to review the results.  Follow-Up: At [facility], you and your health needs are our priority.  As part of our continuing mission to provide you with exceptional heart care, we have created designated Provider Care Teams.  These Care Teams include your primary Cardiologist (physician) and Advanced Practice Providers (APPs -  Physician Assistants and Nurse Practitioners) who all work together to provide you with the care you need, when you need it.   Your next appointment:   02/17/2022 at 11:30 AM  The format for your next appointment:   In Person  Provider:   Nell Range, PA-C{   Signed, Angelena Form, PA-C  01/19/2022 3:16 AM    [place]

## 2022-02-13 NOTE — Addendum Note (Signed)
Encounter addended by: Markus Daft A on: 02/13/2022 2:16 PM  Actions taken: Imaging Exam ended

## 2022-02-14 NOTE — Progress Notes (Addendum)
[facility] [place]                                     Cardiology Office Note:    Date:  02/17/2022   ID:  Mandy Webb, DOB 1946/07/26, MRN [id number]  PCP:  Graylon Gunning, [place] Cardiologist:  Freada Bergeron, MD /Dr. Burt Knack & Dr. Cyndia Bent (TAVR) [facility] Electrophysiologist:  None   Referring MD: [name], MD   Chief Complaint  Patient presents with   Follow-up    1 month s/p TAVR   History of Present Illness:    Mandy Webb is a 76 y.o. female with a hx of HTN, HLD, DMT2, GERD, mild dementia and severe AS s/p TAVR (01/13/22) who presents to clinic for follow up.    Patient had been followed in our clinic for moderate AS. She was added onto Dr. Jacolyn Reedy schedule in November 2022 for evaluation of presyncope. Echo was updated on 11/09/21 which showed progression of aortic stenosis to severe. The mean gradient was 27 mmHg with a peak gradient of 46 mmHg. Aortic valve area was 0.55 cm with a dimensionless index of 0.17 and a stroke-volume index that was low at 31. Left ventricular ejection fraction was 60 to 65% with grade 1 diastolic dysfunction. She underwent cardiac catheterization on 11/30/2021 showing mild diffuse nonobstructive coronary disease.  Filling pressures and cardiac index were within normal limits.   She was evaluated by the multidisciplinary valve team and successful TAVR with a 23 mm Edwards Sapien 3 Ultra Resilia THV via the TF approach on 01/13/22. Post operative echo showed EF 55%, normally functioning TAVR with a mean gradient of 5 mmHg and trivial PVL. She had marked sinus bradycardia after TAVR and temp wire was left in place. Her heart rhythm improved in she was sent home with a Zio AT.  She was started on a baby aspirin.    She was seen in [facility] follow up at which time she had c/o progressive leg weakness since TAVR. Plan was to follow symptoms and reassess today. She states that  her leg weakness has improved and has no specific complaints today. She denies chest pain, SOB, palpitations, LE edema, orthopnea, dizziness, or syncope. She now lives with her daughter who takes fantastic care of her.   Past Medical History:  Diagnosis Date   Anemia    Arthritis    Asthma    Dementia ([place])    Depression    Diabetes mellitus    no meds, diet controlled   Full dentures    GERD (gastroesophageal reflux disease)    Glaucoma    bilateral eyes   Hearing loss    wears hearing aids   Hepatitis 1999   Hep A-Treated   History of CVA (cerebrovascular accident) 1999   Hyperlipidemia    Hypertension    Hypothyroidism    Neuropathy    S/P TAVR (transcatheter aortic valve replacement) 01/10/2022   s/p TAVR with a 64mm Edwards S3UR via the TF approach   Severe aortic stenosis    Wears glasses     Past Surgical History:  Procedure Laterality Date   APPENDECTOMY     COLONOSCOPY     INTRAOPERATIVE TRANSTHORACIC ECHOCARDIOGRAM N/A 01/10/2022   Procedure: INTRAOPERATIVE TRANSTHORACIC ECHOCARDIOGRAM;  Surgeon: Sherren Mocha, MD;  Location: [facility];  Service: Open Heart Surgery;  Laterality: N/A;  PITUITARY SURGERY     RIGHT/LEFT HEART CATH AND CORONARY ANGIOGRAPHY N/A 11/30/2021   Procedure: RIGHT/LEFT HEART CATH AND CORONARY ANGIOGRAPHY;  Surgeon: Early Osmond, MD;  Location: [facility];  Service: Cardiovascular;  Laterality: N/A;   TRANSCATHETER AORTIC VALVE REPLACEMENT, TRANSFEMORAL N/A 01/10/2022   Procedure: TRANSCATHETER AORTIC VALVE REPLACEMENT, TRANSFEMORAL;  Surgeon: Sherren Mocha, MD;  Location: [facility];  Service: Open Heart Surgery;  Laterality: N/A;   TUBAL LIGATION     ULTRASOUND GUIDANCE FOR VASCULAR ACCESS Bilateral 01/10/2022   Procedure: ULTRASOUND GUIDANCE FOR VASCULAR ACCESS;  Surgeon: Sherren Mocha, MD;  Location: [facility];  Service: Open Heart Surgery;  Laterality: Bilateral;    Current Medications: Current Meds  Medication Sig   albuterol  (PROVENTIL HFA;VENTOLIN HFA) 108 (90 BASE) MCG/ACT inhaler Inhale 2 puffs into the lungs every 6 (six) hours as needed for wheezing (cough, shortness of breath or wheezing.).   Alpha-Lipoic Acid 600 MG CAPS Take 600 mg by mouth daily.   amLODipine (NORVASC) 10 MG tablet Take 10 mg by mouth daily.   amoxicillin (AMOXIL) 500 MG capsule Take 4 capsules by mouth 1 hour prior to dental visits   aspirin 81 MG chewable tablet Chew 1 tablet (81 mg total) by mouth daily. Okay to swallow whole   Brinzolamide-Brimonidine (SIMBRINZA) 1-0.2 % SUSP Apply 1 drop to eye in the morning, at noon, and at bedtime. (Patient taking differently: Apply 1 drop to eye 2 (two) times daily.)   donepezil (ARICEPT) 10 MG tablet Take 10 mg by mouth at bedtime.   famotidine (PEPCID) 10 MG tablet Take 10 mg by mouth 2 (two) times daily.   levothyroxine (SYNTHROID) 200 MCG tablet    Multiple Vitamins-Minerals (PRESERVISION AREDS 2) CHEW Chew 1 tablet by mouth daily.   polyethylene glycol powder (GLYCOLAX/MIRALAX) powder TAKE 17 G BY MOUTH DAILY   ROCKLATAN 0.02-0.005 % SOLN Place 1 drop into both eyes at bedtime.   rosuvastatin (CRESTOR) 40 MG tablet Take 40 mg by mouth daily.   senna-docusate (SENOKOT-S) 8.6-50 MG tablet Take 1 tablet by mouth at bedtime.     Allergies:   Protonix [pantoprazole sodium]   Social History   Socioeconomic History   Marital status: Single    Spouse name: Not on file   Number of children: 3   Years of education: Not on file   Highest education level: Not on file  Occupational History   Occupation: Retired    Fish farm manager: UNEMPLOYED  Tobacco Use   Smoking status: Former    Types: Cigarettes    Quit date: 02/02/1969    Years since quitting: 53.0   Smokeless tobacco: Never  Vaping Use   Vaping Use: Never used  Substance and Sexual Activity   Alcohol use: No    Alcohol/week: 0.0 standard drinks   Drug use: No   Sexual activity: Never    Birth control/protection: Post-menopausal, Surgical     Comment: Tubal Ligation  Other Topics Concern   Not on file  Social History Narrative   Lives alone;   1 daughter lives in [place]   1 son lives in [place] Strain: Not on file  Food Insecurity: Not on file  Transportation Needs: Not on file  Physical Activity: Not on file  Stress: Not on file  Social Connections: Not on file     Family History: The patient's family history includes Cancer in her mother; Heart disease in her brother; Hypertension  in her father; Stomach cancer in her mother. There is no history of Esophageal cancer, Colon polyps, or Rectal cancer.  ROS:   Please see the history of present illness.    All other systems reviewed and are negative.  EKGs/Labs/Other Studies Reviewed:    The following studies were reviewed today:  TAVR OPERATIVE NOTE     Date of Procedure:                01/10/2022   Preoperative Diagnosis:      Severe Aortic Stenosis    Postoperative Diagnosis:    Same    Procedure:        Transcatheter Aortic Valve Replacement - Percutaneous Right Transfemoral Approach             Edwards Sapien 3 Ultra THV (size 23 mm, model # 9755RSL, serial # [id number])              Co-Surgeons:                        [name], MD and Sherren Mocha, MD       Anesthesiologist:                  Raechel Ache, MD   Echocardiographer:              Osborne Oman, MD   Pre-operative Echo Findings: Severe aortic stenosis Normal left ventricular systolic function   Post-operative Echo Findings: No paravalvular leak Normal left ventricular systolic function   ___________________________   Echo 01/11/22:  IMPRESSIONS   1. The aortic valve has been replaced by a . Aortic valve regurgitation  is trivial (paravalular leak at the 12 o'clock position on PSAX sweep).  There is a 23 mm Sampien valve present in the aortic position. Effective  orifice area, by VTI measures 1.22  cm. Aortic  valve mean gradient measures 5.0 mmHg. DVI 0.54. Acceleration  time 69 ms.   2. Left ventricular ejection fraction, by estimation, is 55 to 60%. The  left ventricle has normal function. The left ventricle demonstrates  regional wall motion abnormalities (abnormal septal motion consistent with  LBBB).   3. Right ventricular systolic function is normal. The right ventricular  size is normal.   4. The mitral valve is grossly normal. Mild mitral valve regurgitation.  No evidence of mitral stenosis.   5. The inferior vena cava is normal in size with <50% respiratory  variability, suggesting right atrial pressure of 8 mmHg.   Comparison(s): Stable prosthetic function with trivial PVL.   EKG:  EKG is not ordered today.    Recent Labs: 01/09/2022: ALT 24; B Natriuretic Peptide 97.8 01/11/2022: Magnesium 1.8 01/12/2022: BUN 9; Creatinine, Ser 0.65; Hemoglobin 12.9; Platelets 71; Potassium 3.9; Sodium 139   Recent Lipid Panel    Component Value Date/Time   CHOL 193 08/19/2021 1108   TRIG 135 08/19/2021 1108   HDL 65 08/19/2021 1108   CHOLHDL 3.0 08/19/2021 1108   CHOLHDL 3.6 12/16/2014 1005   VLDL 33 12/16/2014 1005   LDLCALC 104 (H) 08/19/2021 1108   LDLDIRECT 89 03/23/2015 1620   Physical Exam:    VS:  BP 140/70    Pulse 76    Ht 5' (1.524 m)    Wt 119 lb 12.8 oz (54.3 kg)    SpO2 93%    BMI 23.40 kg/m     Wt Readings from Last 3 Encounters:  02/17/22 119 lb 12.8  oz (54.3 kg)  01/18/22 120 lb 3.2 oz (54.5 kg)  01/12/22 119 lb 14.9 oz (54.4 kg)    General: Well developed, well nourished, NAD Lungs:Clear to ausculation bilaterally. No wheezes, rales, or rhonchi. Breathing is unlabored. Cardiovascular: RRR with S1 S2. No murmurs Extremities: No edema.  Neuro: Alert and oriented. No focal deficits. No facial asymmetry. MAE spontaneously. Psych: Responds to questions appropriately with normal affect.    ASSESSMENT/PLAN:    Severe AS s/p TAVR: Doing great 1 month out from TAVR  with NYHA class I symptoms. Leg weakness improved. SBE prophylaxis discussed and has RX for amoxicillin. Continue on ASA 81mg . Echocardiogram today shows   Sinus bradycardia: Prior Zio Patch with no bradyarrhythmias. Denies dizziness or syncope   HTN: 140/70 today. Continue current regimen with amlodipine and losartan.    Dementia: Stable. Now lives with daughter.     Leg weakness: Denies recurrence. No need for further testing.    Medication Adjustments/Labs and Tests Ordered: Current medicines are reviewed at length with the patient today.  Concerns regarding medicines are outlined above.  Orders Placed This Encounter  Procedures   ECHOCARDIOGRAM COMPLETE   No orders of the defined types were placed in this encounter.   Patient Instructions  Medication Instructions:  Your physician recommends that you continue on your current medications as directed. Please refer to the Current Medication list given to you today.  *If you need a refill on your cardiac medications before your next appointment, please call your pharmacy*   Lab Work: None ordered  If you have labs (blood work) drawn today and your tests are completely normal, you will receive your results only by: [place] (if you have MyChart) OR A paper copy in the mail If you have any lab test that is abnormal or we need to change your treatment, we will call you to review the results.   Testing/Procedures: Your physician has requested that you have an echocardiogram. Echocardiography is a painless test that uses sound waves to create images of your heart. It provides your doctor with information about the size and shape of your heart and how well your hearts chambers and valves are working. This procedure takes approximately one hour. There are no restrictions for this procedure.    Follow-Up: At [facility], you and your health needs are our priority.  As part of our continuing mission to provide you with  exceptional heart care, we have created designated Provider Care Teams.  These Care Teams include your primary Cardiologist (physician) and Advanced Practice Providers (APPs -  Physician Assistants and Nurse Practitioners) who all work together to provide you with the care you need, when you need it.  We recommend signing up for the patient portal called "MyChart".  Sign up information is provided on this After Visit Summary.  MyChart is used to connect with patients for Virtual Visits (Telemedicine).  Patients are able to view lab/test results, encounter notes, upcoming appointments, etc.  Non-urgent messages can be sent to your provider as well.   To learn more about what you can do with MyChart, go to [email or web address].    Your next appointment:   1 year(s)  The format for your next appointment:   In Person  Provider:   Kathyrn Drown, NP or Nell Range, PA-C    Other Instructions     Signed, Kathyrn Drown, NP  02/17/2022 11:58 AM    [place]

## 2022-02-17 ENCOUNTER — Other Ambulatory Visit: Payer: Medicare (Managed Care) | Admitting: *Deleted

## 2022-02-17 ENCOUNTER — Ambulatory Visit (HOSPITAL_COMMUNITY): Payer: Medicare (Managed Care) | Attending: Cardiology

## 2022-02-17 ENCOUNTER — Other Ambulatory Visit: Payer: Self-pay

## 2022-02-17 ENCOUNTER — Ambulatory Visit (INDEPENDENT_AMBULATORY_CARE_PROVIDER_SITE_OTHER): Payer: Medicare (Managed Care) | Admitting: Cardiology

## 2022-02-17 VITALS — BP 140/70 | HR 76 | Ht 60.0 in | Wt 119.8 lb

## 2022-02-17 DIAGNOSIS — I10 Essential (primary) hypertension: Secondary | ICD-10-CM | POA: Diagnosis not present

## 2022-02-17 DIAGNOSIS — Z952 Presence of prosthetic heart valve: Secondary | ICD-10-CM | POA: Diagnosis present

## 2022-02-17 DIAGNOSIS — R001 Bradycardia, unspecified: Secondary | ICD-10-CM

## 2022-02-17 DIAGNOSIS — I25119 Atherosclerotic heart disease of native coronary artery with unspecified angina pectoris: Secondary | ICD-10-CM

## 2022-02-17 DIAGNOSIS — E782 Mixed hyperlipidemia: Secondary | ICD-10-CM

## 2022-02-17 DIAGNOSIS — I350 Nonrheumatic aortic (valve) stenosis: Secondary | ICD-10-CM

## 2022-02-17 LAB — ECHOCARDIOGRAM COMPLETE
AV Mean grad: 8.4 mmHg
AV Peak grad: 15.1 mmHg
Ao pk vel: 1.94 m/s
Area-P 1/2: 3.03 cm2
S' Lateral: 1.8 cm

## 2022-02-17 LAB — LIPID PANEL
Chol/HDL Ratio: 2.2 ratio (ref 0.0–4.4)
Cholesterol, Total: 130 mg/dL (ref 100–199)
HDL: 59 mg/dL (ref >39)
LDL Chol Calc (NIH): 51 mg/dL (ref 0–99)
Triglycerides: 110 mg/dL (ref 0–149)
VLDL Cholesterol Cal: 20 mg/dL (ref 5–40)

## 2022-02-17 NOTE — Patient Instructions (Signed)
Medication Instructions:  ?Your physician recommends that you continue on your current medications as directed. Please refer to the Current Medication list given to you today. ? ?*If you need a refill on your cardiac medications before your next appointment, please call your pharmacy* ? ? ?Lab Work: ?None ordered ? ?If you have labs (blood work) drawn today and your tests are completely normal, you will receive your results only by: ?MyChart Message (if you have MyChart) OR ?A paper copy in the mail ?If you have any lab test that is abnormal or we need to change your treatment, we will call you to review the results. ? ? ?Testing/Procedures: ?Your physician has requested that you have an echocardiogram. Echocardiography is a painless test that uses sound waves to create images of your heart. It provides your doctor with information about the size and shape of your heart and how well your heart?s chambers and valves are working. This procedure takes approximately one hour. There are no restrictions for this procedure. ? ? ? ?Follow-Up: ?At [facility], you and your health needs are our priority.  As part of our continuing mission to provide you with exceptional heart care, we have created designated Provider Care Teams.  These Care Teams include your primary Cardiologist (physician) and Advanced Practice Providers (APPs -  Physician Assistants and Nurse Practitioners) who all work together to provide you with the care you need, when you need it. ? ?We recommend signing up for the patient portal called "MyChart".  Sign up information is provided on this After Visit Summary.  MyChart is used to connect with patients for Virtual Visits (Telemedicine).  Patients are able to view lab/test results, encounter notes, upcoming appointments, etc.  Non-urgent messages can be sent to your provider as well.   ?To learn more about what you can do with MyChart, go to [email or web address].   ? ?Your next appointment:   ?1  year(s) ? ?The format for your next appointment:   ?In Person ? ?Provider:   ?Kathyrn Drown, NP or Nell Range, PA-C  ? ? ?Other Instructions ? ? ?

## 2022-03-01 ENCOUNTER — Other Ambulatory Visit: Payer: Medicare (Managed Care)

## 2022-04-20 NOTE — Progress Notes (Deleted)
Cardiology Office Note:    Date:  04/20/2022   ID:  Mandy Webb, DOB [birth year], MRN [id number]  PCP:  [facility]  Cardiologist:  Freada Bergeron, MD  Advanced Practice Provider:  No care team member to display Electrophysiologist:  None    Referring MD: [facility], [place]    History of Present Illness:    Mandy Webb is a 76 y.o. female with a hx of DMII, HTN, HLD, and severe AS s/p TAVR who returns to clinic for follow-up.   Patient was seen as an urgent visit in 10/2021 for evaluation of presyncope in the setting of known aortic stenosis. TTE 11/09/21 showed progression of aortic stenosis to severe with mean gradient 27 mmHg, peak gradient of 46 mmHg, AVA 0.55 cm, DI 0.17, Svi low at 31. Left ventricular ejection fraction was 60 to 65% with grade 1 diastolic dysfunction. She underwent cardiac catheterization on 11/30/2021 showing mild diffuse nonobstructive coronary disease.  Filling pressures and cardiac index were within normal limits.   She was evaluated by the multidisciplinary valve team and successful TAVR with a 23 mm Edwards Sapien 3 Ultra Resilia THV via the TF approach on 01/13/22. Post operative echo showed EF 55%, normally functioning TAVR with a mean gradient of 5 mmHg and trivial PVL. She had marked sinus bradycardia after TAVR and temp wire was left in place. Her heart rhythm improved in she was sent home with a Zio AT.  Monitor showed NSR-sinus tachycardia, intermittent RBBB, no sustained arrhythmias, high degree block, pauses or significant bradycardic episodes.   Was last seen by Lynne Logan in 02/2022 where she was doing well. Leg weakness had improved.   Today, ***   Past Medical History:  Diagnosis Date   Anemia    Arthritis    Asthma    Dementia ([place])    Depression    Diabetes mellitus    no meds, diet controlled   Full dentures    GERD (gastroesophageal reflux disease)    Glaucoma     bilateral eyes   Hearing loss    wears hearing aids   Hepatitis 1999   Hep A-Treated   History of CVA (cerebrovascular accident) 1999   Hyperlipidemia    Hypertension    Hypothyroidism    Neuropathy    S/P TAVR (transcatheter aortic valve replacement) 01/10/2022   s/p TAVR with a 109m Edwards S3UR via the TF approach   Severe aortic stenosis    Wears glasses     Past Surgical History:  Procedure Laterality Date   APPENDECTOMY     COLONOSCOPY     INTRAOPERATIVE TRANSTHORACIC ECHOCARDIOGRAM N/A 01/10/2022   Procedure: INTRAOPERATIVE TRANSTHORACIC ECHOCARDIOGRAM;  Surgeon: CSherren Mocha MD;  [facility] [facility]  Service: Open Heart Surgery;  Laterality: N/A;   PITUITARY SURGERY     RIGHT/LEFT HEART CATH AND CORONARY ANGIOGRAPHY N/A 11/30/2021   Procedure: RIGHT/LEFT HEART CATH AND CORONARY ANGIOGRAPHY;  Surgeon: TEarly Osmond MD;  [facility];  Service: Cardiovascular;  Laterality: N/A;   TRANSCATHETER AORTIC VALVE REPLACEMENT, TRANSFEMORAL N/A 01/10/2022   Procedure: TRANSCATHETER AORTIC VALVE REPLACEMENT, TRANSFEMORAL;  Surgeon: CSherren Mocha MD;  [place] [facility]  Service: Open Heart Surgery;  Laterality: N/A;   TUBAL LIGATION     ULTRASOUND GUIDANCE FOR VASCULAR ACCESS Bilateral 01/10/2022   Procedure: ULTRASOUND GUIDANCE FOR VASCULAR ACCESS;  Surgeon: CSherren Mocha MD;  [place] [facility]  Service: Open Heart Surgery;  Laterality: Bilateral;  Current Medications: No outpatient medications have been marked as taking for the 04/24/22 encounter (Appointment) with Freada Bergeron, MD.     Allergies:   Protonix [pantoprazole sodium]   Social History   Socioeconomic History   Marital status: Single    Spouse name: Not on file   Number of children: 3   Years of education: Not on file   Highest education level: Not on file  Occupational History   Occupation: Retired    Fish farm manager: UNEMPLOYED  Tobacco Use   Smoking status: Former    Types:  Cigarettes    Quit date: 02/02/1969    Years since quitting: 53.2   Smokeless tobacco: Never  Vaping Use   Vaping Use: Never used  Substance and Sexual Activity   Alcohol use: No    Alcohol/week: 0.0 standard drinks   Drug use: No   Sexual activity: Never    Birth control/protection: Post-menopausal, Surgical    Comment: Tubal Ligation  Other Topics Concern   Not on file  Social History Narrative   Lives alone;   1 daughter lives in [place]   1 son lives in [place] Determinants of Health   Financial Resource Strain: Not on file  Food Insecurity: Not on file  Transportation Needs: Not on file  Physical Activity: Not on file  Stress: Not on file  Social Connections: Not on file     Family History: The patient's family history includes Cancer in her mother; Heart disease in her brother; Hypertension in her father; Stomach cancer in her mother. There is no history of Esophageal cancer, Colon polyps, or Rectal cancer.  ROS:   Please see the history of present illness.    Review of Systems  Constitutional:  Negative for chills and fever.  HENT:  Negative for sore throat.   Eyes:  Negative for blurred vision and redness.  Respiratory:  Negative for shortness of breath.   Cardiovascular:  Negative for chest pain, palpitations, orthopnea, claudication, leg swelling and PND.  Gastrointestinal:  Negative for melena, nausea and vomiting.  Genitourinary:  Negative for dysuria.  Musculoskeletal:  Positive for joint pain and myalgias.  Neurological:  Positive for dizziness. Negative for loss of consciousness.  Endo/Heme/Allergies:  Negative for polydipsia.  Psychiatric/Behavioral:  Negative for substance abuse.    EKGs/Labs/Other Studies Reviewed:    The following studies were reviewed today: TAVR OPERATIVE NOTE     Date of Procedure:                01/10/2022   Preoperative Diagnosis:      Severe Aortic Stenosis    Postoperative Diagnosis:    Same    Procedure:         Transcatheter Aortic Valve Replacement - Percutaneous Right Transfemoral Approach             Edwards Sapien 3 Ultra THV (size 23 mm, model # 9755RSL, serial # [id number])              Co-Surgeons:                        [name], MD and Sherren Mocha, MD       Anesthesiologist:                  Raechel Ache, MD   Echocardiographer:              Osborne Oman, MD   Pre-operative Echo Findings: Severe  aortic stenosis Normal left ventricular systolic function   Post-operative Echo Findings: No paravalvular leak Normal left ventricular systolic function   ___________________________   Echo 01/11/22:  IMPRESSIONS   1. The aortic valve has been replaced by a . Aortic valve regurgitation  is trivial (paravalular leak at the 12 o'clock position on PSAX sweep).  There is a 23 mm Sampien valve present in the aortic position. Effective  orifice area, by VTI measures 1.22  cm. Aortic valve mean gradient measures 5.0 mmHg. DVI 0.54. Acceleration  time 69 ms.   2. Left ventricular ejection fraction, by estimation, is 55 to 60%. The  left ventricle has normal function. The left ventricle demonstrates  regional wall motion abnormalities (abnormal septal motion consistent with  LBBB).   3. Right ventricular systolic function is normal. The right ventricular  size is normal.   4. The mitral valve is grossly normal. Mild mitral valve regurgitation.  No evidence of mitral stenosis.   5. The inferior vena cava is normal in size with <50% respiratory  variability, suggesting right atrial pressure of 8 mmHg.   Comparison(s): Stable prosthetic function with trivial PVL.   Cardiac Monitor 01/24/22: Patch wear time was 12 days and 21 hours Predominant rhythm was NSR with HR 70 (ranging from 46-164bpm) 9 episodes of nonsustained SVT with longest lasting 14 beats Intermittent RBBB Rare SVE, rare VE (<1%) No sustained arrhythmias, high degree AVB, or pauses     Patch Wear Time:  12  days and 21 hours (2023-01-26T09:56:14-0500 to 2023-02-08T07:11:14-0500)   Patient had a min HR of 46 bpm, max HR of 164 bpm, and avg HR of 70 bpm. Predominant underlying rhythm was Sinus Rhythm. Intermittent Bundle Branch Block was present. 9 Supraventricular Tachycardia runs occurred, the run with the fastest interval lasting  9 beats with a max rate of 164 bpm, the longest lasting 14 beats with an avg rate of 102 bpm. Isolated SVEs were rare (<1.0%), SVE Couplets were rare (<1.0%), and SVE Triplets were rare (<1.0%). Isolated VEs were rare (<1.0%), VE Couplets were rare  (<1.0%), and no VE Triplets were present. Inverted QRS complexes possibly due to inverted placement of device.   Gwyndolyn Kaufman, MD  EKG:  EKG is  ordered today.  The ekg ordered today demonstrates NSR with TWI inferolaterally, HR 69  Recent Labs: 01/09/2022: ALT 24; B Natriuretic Peptide 97.8 01/11/2022: Magnesium 1.8 01/12/2022: BUN 9; Creatinine, Ser 0.65; Hemoglobin 12.9; Platelets 71; Potassium 3.9; Sodium 139  Recent Lipid Panel    Component Value Date/Time   CHOL 130 02/17/2022 1123   TRIG 110 02/17/2022 1123   HDL 59 02/17/2022 1123   CHOLHDL 2.2 02/17/2022 1123   CHOLHDL 3.6 12/16/2014 1005   VLDL 33 12/16/2014 1005   LDLCALC 51 02/17/2022 1123   LDLDIRECT 89 03/23/2015 1620     Physical Exam:    VS:  There were no vitals taken for this visit.    Wt Readings from Last 3 Encounters:  02/17/22 119 lb 12.8 oz (54.3 kg)  01/18/22 120 lb 3.2 oz (54.5 kg)  01/12/22 119 lb 14.9 oz (54.4 kg)     GEN:  Comfortable, NAD HEENT: Normal NECK: No JVD; No carotid bruits CARDIAC: RRR, 3/6 harsh nearly holosytolic murmur that radiates to the carotids. No rubs, gallops RESPIRATORY:  CTAB ABDOMEN: Soft, non-tender, non-distended MUSCULOSKELETAL:  No edema; No deformity  SKIN: Warm and dry NEUROLOGIC:  Alert and oriented x 3 PSYCHIATRIC:  Normal affect   ASSESSMENT:  No diagnosis found.  PLAN:    In  order of problems listed above:  #Severe AS s/p TAVR: Underwent successful TAVR with a 23 mm Edwards Sapien 3 Ultra Resilia THV via the TF approach on 01/13/22. Post operative echo showed EF 55%, normally functioning TAVR with a mean gradient of 5 mmHg and trivial PVL. Post-procedure course complicated by bradycardia requiring temp pacer which subsequently resolved. Follow-up monitor with no evidence of high degree AVB or sustained episodes of bradycardia. -Follow-up with structural team as scheduled -Continue ASA '81mg'$  daily -SBE ppx  #Mild Nonobstructive CAD: Pre-op cath with mild disease.  -Continue ASA '81mg'$  daily -Continue crestor '40mg'$  daily  #HTN: -Continue losartan '25mg'$  daily -Continue amlodipine '10mg'$  daily  #HLD: -Continue crestor '40mg'$  daily  #DMII: -Continue insulin and metformin  #Dementia: -Management per PCP   Medication Adjustments/Labs and Tests Ordered: Current medicines are reviewed at length with the patient today.  Concerns regarding medicines are outlined above.  No orders of the defined types were placed in this encounter.  No orders of the defined types were placed in this encounter.   There are no Patient Instructions on file for this visit.    Signed, Freada Bergeron, MD  04/20/2022 8:25 PM    [facility]

## 2022-04-24 ENCOUNTER — Ambulatory Visit: Payer: Medicare (Managed Care) | Admitting: Cardiology

## 2023-01-09 ENCOUNTER — Telehealth: Payer: Self-pay | Admitting: Physician Assistant

## 2023-01-09 NOTE — Telephone Encounter (Signed)
  [facility]ENTER   MULTIDISCIPLINARY HEART VALVE TEAM   Pt underwent TAVR 1 year ago on 01/10/23. Due for 1 year follow up tomorrow. Pt has relocated to [facility] and now followed by [facility]. Spoke with daughter over the phone and the pt continues to do quite well.   KCCQ completed over the phone. She has NYHA class I symptoms. She will reach out to [facility] to make sure she has a 1 year echo within the 1 year window. She will mychart me the date and we will follow for results in [place].   [place] Cardiomyopathy Questionnaire     01/09/2023    3:50 PM 02/17/2022   11:30 AM 11/23/2021   12:01 PM  KCCQ-12  1 a. Ability to shower/bathe Not at all limited Not at all limited Not at all limited  1 b. Ability to walk 1 block Not at all limited Not at all limited Not at all limited  1 c. Ability to hurry/jog Not at all limited Other, Did not do Extremely limited  2. Edema feet/ankles/legs Never over the past 2 weeks Never over the past 2 weeks Never over the past 2 weeks  3. Limited by fatigue Never over the past 2 weeks Less than once a week All of the time  4. Limited by dyspnea Never over the past 2 weeks Never over the past 2 weeks Never over the past 2 weeks  5. Sitting up / on 3+ pillows Never over the past 2 weeks Never over the past 2 weeks Never over the past 2 weeks  6. Limited enjoyment of life Not limited at all Not limited at all Extremely limited  7. Rest of life w/ symptoms Completely satisfied Completely satisfied Not at all satisfied  8 a. Participation in hobbies Did not limit at all Did not limit at all Severely limited  8 b. Participation in chores Did not limit at all Slightly limited Limited quite a bit  8 c. Visiting family/friends Did not limit at all Did not limit at all Moderately limited      Angelena Form PA-C  [facility]

## 2023-01-09 NOTE — Progress Notes (Deleted)
[facility] [place]                                     Cardiology Office Note:    Date:  01/09/2023   ID:  Mandy Webb, DOB 01-28-1946, MRN [id number]  PCP:  [facility]lon Gunning, [place] Cardiologist:  Freada Bergeron, MD / Dr. Burt Knack & Dr. Cyndia Bent (TAVR) [place] Electrophysiologist:  None   Referring MD: [place], [place]   1 year s/p TAVR  History of Present Illness:    Mandy Webb is a 77 y.o. female with a hx of HTN, HLD, DMT2, GERD, mild dementia and severe AS s/p TAVR (01/13/22) who presents to clinic for follow up.   Patient had been followed in our clinic for moderate AS. She was added onto Dr. Jacolyn Reedy schedule in November 2022 for evaluation of presyncope. Echo was updated on 11/09/21 which showed progression of aortic stenosis to severe. The mean gradient was 27 mmHg with a peak gradient of 46 mmHg. Aortic valve area was 0.55 cm with a dimensionless index of 0.17 and a stroke-volume index that was low at 31. Left ventricular ejection fraction was 60 to 65% with grade 1 diastolic dysfunction. She underwent cardiac catheterization on 11/30/2021 showing mild diffuse nonobstructive coronary disease.  Filling pressures and cardiac index were within normal limits.   She was evaluated by the multidisciplinary valve team and successful TAVR with a 23 mm Edwards Sapien 3 Ultra Resilia THV via the TF approach on 01/13/22. Post operative echo showed EF 55%, normally functioning TAVR with a mean gradient of 5 mmHg and trivial PVL. She had marked sinus bradycardia after TAVR and temp wire was left in place. Her heart rhythm improved in she was sent home with a Zio AT.  She was started on a baby aspirin.   Today the patient presents to clinic for follow up.     Past Medical History:  Diagnosis Date   Anemia    Arthritis    Asthma    Dementia ([place])    Depression    Diabetes mellitus    no meds, diet  controlled   Full dentures    GERD (gastroesophageal reflux disease)    Glaucoma    bilateral eyes   Hearing loss    wears hearing aids   Hepatitis 1999   Hep A-Treated   History of CVA (cerebrovascular accident) 1999   Hyperlipidemia    Hypertension    Hypothyroidism    Neuropathy    S/P TAVR (transcatheter aortic valve replacement) 01/10/2022   s/p TAVR with a 51m Edwards S3UR via the TF approach   Severe aortic stenosis    Wears glasses     Past Surgical History:  Procedure Laterality Date   APPENDECTOMY     COLONOSCOPY     INTRAOPERATIVE TRANSTHORACIC ECHOCARDIOGRAM N/A 01/10/2022   Procedure: INTRAOPERATIVE TRANSTHORACIC ECHOCARDIOGRAM;  Surgeon: CSherren Mocha MD;  [place] [facility]  Service: Open Heart Surgery;  Laterality: N/A;   PITUITARY SURGERY     RIGHT/LEFT HEART CATH AND CORONARY ANGIOGRAPHY N/A 11/30/2021   Procedure: RIGHT/LEFT HEART CATH AND CORONARY ANGIOGRAPHY;  Surgeon: TEarly Osmond MD;  [facility];  Service: Cardiovascular;  Laterality: N/A;   TRANSCATHETER AORTIC VALVE REPLACEMENT, TRANSFEMORAL N/A 01/10/2022   Procedure: TRANSCATHETER AORTIC VALVE REPLACEMENT, TRANSFEMORAL;  Surgeon: CSherren Mocha MD;  [place] [facility]  Service: Open Heart Surgery;  Laterality: N/A;   TUBAL LIGATION     ULTRASOUND GUIDANCE FOR VASCULAR ACCESS Bilateral 01/10/2022   Procedure: ULTRASOUND GUIDANCE FOR VASCULAR ACCESS;  Surgeon: Sherren Mocha, MD;  Location: [place];  Service: Open Heart Surgery;  Laterality: Bilateral;    Current Medications: No outpatient medications have been marked as taking for the 01/10/23 encounter (Appointment) with [facility].     Allergies:   Protonix [pantoprazole sodium]   Social History   Socioeconomic History   Marital status: Single    Spouse name: Not on file   Number of children: 3   Years of education: Not on file   Highest education level: Not on file  Occupational History    Occupation: Retired    Fish farm manager: UNEMPLOYED  Tobacco Use   Smoking status: Former    Types: Cigarettes    Quit date: 02/02/1969    Years since quitting: 53.9   Smokeless tobacco: Never  Vaping Use   Vaping Use: Never used  Substance and Sexual Activity   Alcohol use: No    Alcohol/week: 0.0 standard drinks of alcohol   Drug use: No   Sexual activity: Never    Birth control/protection: Post-menopausal, Surgical    Comment: Tubal Ligation  Other Topics Concern   Not on file  Social History Narrative   Lives alone;   1 daughter lives in [place]   1 son lives in [place] Strain: Not on file  Food Insecurity: Not on file  Transportation Needs: Not on file  Physical Activity: Not on file  Stress: Not on file  Social Connections: Not on file     Family History: The patient's family history includes Cancer in her mother; Heart disease in her brother; Hypertension in her father; Stomach cancer in her mother. There is no history of Esophageal cancer, Colon polyps, or Rectal cancer.  ROS:   Please see the history of present illness.    All other systems reviewed and are negative.  EKGs/Labs/Other Studies Reviewed:    The following studies were reviewed today:  TAVR OPERATIVE NOTE     Date of Procedure:                01/10/2022   Preoperative Diagnosis:      Severe Aortic Stenosis    Postoperative Diagnosis:    Same    Procedure:        Transcatheter Aortic Valve Replacement - Percutaneous Right Transfemoral Approach             Edwards Sapien 3 Ultra THV (size 23 mm, model # 9755RSL, serial # [id number])              Co-Surgeons:                        [name], MD and Sherren Mocha, MD       Anesthesiologist:                  Raechel Ache, MD   Echocardiographer:              Osborne Oman, MD   Pre-operative Echo Findings: Severe aortic stenosis Normal left ventricular systolic function    Post-operative Echo Findings: No paravalvular leak Normal left ventricular systolic function     ____________________________   Echo 01/11/22:  IMPRESSIONS   1. The aortic valve has been replaced by  a . Aortic valve regurgitation  is trivial (paravalular leak at the 12 o'clock position on PSAX sweep).  There is a 23 mm Sampien valve present in the aortic position. Effective  orifice area, by VTI measures 1.22  cm. Aortic valve mean gradient measures 5.0 mmHg. DVI 0.54. Acceleration  time 69 ms.   2. Left ventricular ejection fraction, by estimation, is 55 to 60%. The  left ventricle has normal function. The left ventricle demonstrates  regional wall motion abnormalities (abnormal septal motion consistent with  LBBB).   3. Right ventricular systolic function is normal. The right ventricular  size is normal.   4. The mitral valve is grossly normal. Mild mitral valve regurgitation.  No evidence of mitral stenosis.   5. The inferior vena cava is normal in size with <50% respiratory  variability, suggesting right atrial pressure of 8 mmHg.   Comparison(s): Stable prosthetic function with trivial PVL.   _______________________  Echo 02/17/22 IMPRESSIONS   1. S/P AVR with mean gradient of 9 mmHg, DI 0.46 and no AI; compared to  01/11/22, AI is not evident on present study.   2. Left ventricular ejection fraction, by estimation, is 60 to 65%. The  left ventricle has normal function. The left ventricle has no regional  wall motion abnormalities. Left ventricular diastolic parameters were  normal.   3. Right ventricular systolic function is normal. The right ventricular  size is normal.   4. The mitral valve is normal in structure. Mild mitral valve  regurgitation. No evidence of mitral stenosis.   5. The aortic valve has been repaired/replaced. Aortic valve  regurgitation is not visualized. No aortic stenosis is present. There is a  23 mm Edwards Sapien 3 Ultra THV valve present in  the aortic position.  Procedure Date: 01/10/22.   6. The inferior vena cava is normal in size with greater than 50%  respiratory variability, suggesting right atrial pressure of 3 mmHg.   _______________________  Echo 01/10/23 ***   EKG:  EKG is NOT ordered today.   Recent Labs: 01/11/2022: Magnesium 1.8 01/12/2022: BUN 9; Creatinine, Ser 0.65; Hemoglobin 12.9; Platelets 71; Potassium 3.9; Sodium 139  Recent Lipid Panel    Component Value Date/Time   CHOL 130 02/17/2022 1123   TRIG 110 02/17/2022 1123   HDL 59 02/17/2022 1123   CHOLHDL 2.2 02/17/2022 1123   CHOLHDL 3.6 12/16/2014 1005   VLDL 33 12/16/2014 1005   LDLCALC 51 02/17/2022 1123   LDLDIRECT 89 03/23/2015 1620     Risk Assessment/Calculations:       Physical Exam:    VS:  There were no vitals taken for this visit.    Wt Readings from Last 3 Encounters:  02/17/22 119 lb 12.8 oz (54.3 kg)  01/18/22 120 lb 3.2 oz (54.5 kg)  01/12/22 119 lb 14.9 oz (54.4 kg)     GEN:  Well nourished, well developed in no acute distress HEENT: Normal NECK: No JVD LYMPHATICS: No lymphadenopathy CARDIAC: RRR, no murmurs, rubs, gallops RESPIRATORY:  Clear to auscultation without rales, wheezing or rhonchi  ABDOMEN: Soft, non-tender, non-distended MUSCULOSKELETAL:  No edema; No deformity  SKIN: Warm and dry NEUROLOGIC:  Alert and oriented x 3 PSYCHIATRIC:  pt a little blunted, daughter does most of talking  ASSESSMENT:    No diagnosis found.  PLAN:    In order of problems listed above:  Severe AS s/p TAVR: doing okay 1 week out from TAVR. Having some new leg weakness. Groin sites healing well.  ECG with no HAVB. SBE prophylaxis discussed; I have RX'd amoxicillin. Continue on baby aspirin. Will see back for 1 month follow up with echo   Sinus bradycardia: ECG with NSR today. Wearing a a Zio Patch with no recurrent bradyarrhythmias.    HTN: BP a little high today but will allow for a little permissive HTN given age and  frailty.   Dementia: she seems to be doing okay from this standpoint. Daughter does most of the talking     Leg weakness: bilateral. Not typical after TAVR. Strength seems okay today. She just gives out when walking. Will consider LE arterial dopplers if this doesn't improve.   Medication Adjustments/Labs and Tests Ordered: Current medicines are reviewed at length with the patient today.  Concerns regarding medicines are outlined above.  No orders of the defined types were placed in this encounter.  No orders of the defined types were placed in this encounter.   There are no Patient Instructions on file for this visit.   Signed, Angelena Form, PA-C  01/09/2023 3:30 PM    [facility]

## 2023-01-10 ENCOUNTER — Ambulatory Visit (HOSPITAL_COMMUNITY): Payer: Medicare (Managed Care)

## 2023-01-10 ENCOUNTER — Ambulatory Visit: Payer: Medicare (Managed Care)

## 2023-01-10 DIAGNOSIS — F0390 Unspecified dementia without behavioral disturbance: Secondary | ICD-10-CM

## 2023-01-10 DIAGNOSIS — I10 Essential (primary) hypertension: Secondary | ICD-10-CM

## 2023-01-10 DIAGNOSIS — R001 Bradycardia, unspecified: Secondary | ICD-10-CM

## 2023-01-10 DIAGNOSIS — Z952 Presence of prosthetic heart valve: Secondary | ICD-10-CM
